# Patient Record
Sex: Male | Born: 1935 | Race: White | Hispanic: No | Smoking: Former smoker
Health system: Southern US, Community
[De-identification: ages and names within clinical notes are randomized; demographics above are authoritative.]

## PROBLEM LIST (undated history)

## (undated) DIAGNOSIS — I739 Peripheral vascular disease, unspecified: Secondary | ICD-10-CM

## (undated) DIAGNOSIS — J45909 Unspecified asthma, uncomplicated: Secondary | ICD-10-CM

## (undated) DIAGNOSIS — N189 Chronic kidney disease, unspecified: Secondary | ICD-10-CM

## (undated) DIAGNOSIS — I10 Essential (primary) hypertension: Secondary | ICD-10-CM

## (undated) HISTORY — DX: Unspecified asthma, uncomplicated: J45.909

## (undated) HISTORY — PX: UPPER GI ENDOSCOPY: SHX6162

---

## 1972-07-03 HISTORY — PX: SHOULDER SURGERY: SHX246

## 2004-04-21 ENCOUNTER — Emergency Department: Payer: Self-pay | Admitting: Emergency Medicine

## 2004-04-27 ENCOUNTER — Ambulatory Visit: Payer: Self-pay | Admitting: Specialist

## 2005-04-13 ENCOUNTER — Other Ambulatory Visit: Payer: Self-pay

## 2005-04-13 ENCOUNTER — Ambulatory Visit: Payer: Self-pay | Admitting: Unknown Physician Specialty

## 2005-04-24 ENCOUNTER — Ambulatory Visit: Payer: Self-pay | Admitting: Unknown Physician Specialty

## 2009-11-26 ENCOUNTER — Ambulatory Visit: Payer: Self-pay | Admitting: Internal Medicine

## 2010-06-15 ENCOUNTER — Ambulatory Visit: Payer: Self-pay | Admitting: Ophthalmology

## 2010-07-20 ENCOUNTER — Ambulatory Visit: Payer: Self-pay | Admitting: Ophthalmology

## 2010-09-22 ENCOUNTER — Ambulatory Visit: Payer: Self-pay | Admitting: Internal Medicine

## 2010-12-09 ENCOUNTER — Ambulatory Visit: Payer: Self-pay | Admitting: Internal Medicine

## 2013-05-15 ENCOUNTER — Encounter: Payer: Self-pay | Admitting: Podiatry

## 2013-05-16 ENCOUNTER — Encounter: Payer: Self-pay | Admitting: Podiatry

## 2013-05-16 ENCOUNTER — Ambulatory Visit (INDEPENDENT_AMBULATORY_CARE_PROVIDER_SITE_OTHER): Payer: Medicare Other | Admitting: Podiatry

## 2013-05-16 VITALS — BP 122/63 | HR 84 | Resp 16 | Ht 70.0 in | Wt 206.0 lb

## 2013-05-16 DIAGNOSIS — M79609 Pain in unspecified limb: Secondary | ICD-10-CM

## 2013-05-16 DIAGNOSIS — B351 Tinea unguium: Secondary | ICD-10-CM

## 2013-05-16 NOTE — Progress Notes (Signed)
Subjective:     Patient ID: Andrew [name], male   DOB: 1923-01-23, 79 y.o.   MRN: [id number]  HPI and presents stating I need my nails cut they are very tender   Review of Systems     Objective:   Physical Exam Neurovascular status unchanged. Severe nail disease with thickness and pain 1-5 both feet    Assessment:     Mycotic nail infection with pain 1-5 both feet    Plan:     Debridement nailbeds 1-5 both feet with no iatrogenic bleeding

## 2013-08-15 ENCOUNTER — Ambulatory Visit: Payer: Medicare Other | Admitting: Podiatry

## 2016-01-13 ENCOUNTER — Encounter: Payer: Self-pay | Admitting: General Surgery

## 2016-01-13 ENCOUNTER — Encounter: Payer: Medicare Other | Attending: General Surgery | Admitting: General Surgery

## 2016-01-13 DIAGNOSIS — Z87891 Personal history of nicotine dependence: Secondary | ICD-10-CM | POA: Diagnosis not present

## 2016-01-13 DIAGNOSIS — I129 Hypertensive chronic kidney disease with stage 1 through stage 4 chronic kidney disease, or unspecified chronic kidney disease: Secondary | ICD-10-CM | POA: Insufficient documentation

## 2016-01-13 DIAGNOSIS — IMO0001 Reserved for inherently not codable concepts without codable children: Secondary | ICD-10-CM

## 2016-01-13 DIAGNOSIS — N183 Chronic kidney disease, stage 3 (moderate): Secondary | ICD-10-CM | POA: Diagnosis not present

## 2016-01-13 DIAGNOSIS — I87311 Chronic venous hypertension (idiopathic) with ulcer of right lower extremity: Secondary | ICD-10-CM | POA: Diagnosis not present

## 2016-01-13 DIAGNOSIS — K219 Gastro-esophageal reflux disease without esophagitis: Secondary | ICD-10-CM | POA: Diagnosis not present

## 2016-01-13 DIAGNOSIS — L97929 Non-pressure chronic ulcer of unspecified part of left lower leg with unspecified severity: Secondary | ICD-10-CM

## 2016-01-13 DIAGNOSIS — M1990 Unspecified osteoarthritis, unspecified site: Secondary | ICD-10-CM | POA: Insufficient documentation

## 2016-01-13 DIAGNOSIS — J449 Chronic obstructive pulmonary disease, unspecified: Secondary | ICD-10-CM | POA: Diagnosis not present

## 2016-01-13 DIAGNOSIS — L97811 Non-pressure chronic ulcer of other part of right lower leg limited to breakdown of skin: Secondary | ICD-10-CM | POA: Insufficient documentation

## 2016-01-13 DIAGNOSIS — L97909 Non-pressure chronic ulcer of unspecified part of unspecified lower leg with unspecified severity: Secondary | ICD-10-CM

## 2016-01-13 DIAGNOSIS — I87312 Chronic venous hypertension (idiopathic) with ulcer of left lower extremity: Secondary | ICD-10-CM

## 2016-01-13 DIAGNOSIS — I87319 Chronic venous hypertension (idiopathic) with ulcer of unspecified lower extremity: Secondary | ICD-10-CM | POA: Insufficient documentation

## 2016-01-13 NOTE — Progress Notes (Signed)
Ulcer much smaller.  Using alginate

## 2016-01-14 NOTE — Progress Notes (Addendum)
VENCE, BAHN ([id number]) Visit Report for 01/13/2016 Chief Complaint Document Details Patient Name: Andrew [name], Andrew [name] Date of Service: 01/13/2016 3:00 PM Medical Record Number: [id number] Patient Account Number: [id number] Date of Birth/Sex: 1923-01-25 (82 y.o. Male) Treating RN: Baruch Gouty, RN, BSN, Velva Harman Primary Care Physician: Ramonita Lab Other Clinician: Referring Physician: Ramonita Lab Treating Physician/Extender: Benjaman Pott in Treatment: 0 Information Obtained from: Patient Electronic Signature(s) Signed: 01/13/2016 3:46:08 PM By: Judene Companion MD Entered By: Judene Companion on 01/13/2016 15:46:08 Andrew [name] ([id number]) -------------------------------------------------------------------------------- Debridement Details Patient Name: Andrew [name] Date of Service: 01/13/2016 3:00 PM Medical Record Number: [id number] Patient Account Number: [id number] Date of Birth/Sex: 12/25/1922 (82 y.o. Male) Treating RN: Baruch Gouty, RN, BSN, [place] Primary Care Physician: Ramonita Lab Other Clinician: Referring Physician: Ramonita Lab Treating Physician/Extender: Benjaman Pott in Treatment: 0 Debridement Performed for Wound #1 Right,Medial Lower Leg Assessment: Performed By: Physician Judene Companion, MD Debridement: Open Wound/Selective Debridement Selective Description: Pre-procedure Yes Verification/Time Out Taken: Start Time: 15:05 Pain Control: Lidocaine 4% Topical Solution Level: Non-Viable Tissue Total Area Debrided (L x 2 (cm) x 2 (cm) = 4 (cm) W): Tissue and other Non-Viable, Exudate, Fibrin/Slough, Subcutaneous material debrided: Instrument: Curette Bleeding: Minimum Hemostasis Achieved: Pressure End Time: 15:05 Procedural Pain: 0 Post Procedural Pain: 0 Response to Treatment: Procedure was tolerated well Post Debridement Measurements of Total Wound Length: (cm) 2 Width: (cm) 2 Depth: (cm) 0.2 Volume: (cm) 0.628 Post Procedure Diagnosis Same as  Pre-procedure Electronic Signature(s) Signed: 01/13/2016 4:12:24 PM By: Regan Lemming BSN, RN Signed: 01/13/2016 4:50:06 PM By: Judene Companion MD Entered By: Regan Lemming on 01/13/2016 15:05:43 Andrew [name] ([id number]) -------------------------------------------------------------------------------- HPI Details Patient Name: Andrew [name] Date of Service: 01/13/2016 3:00 PM Medical Record Number: [id number] Patient Account Number: [id number] Date of Birth/Sex: [birth year] (82 y.o. Male) Treating RN: Baruch Gouty, RN, BSN, [place] Primary Care Physician: Ramonita Lab Other Clinician: Referring Physician: Ramonita Lab Treating Physician/Extender: Judene Companion Weeks in Treatment: 0 Electronic Signature(s) Signed: 01/13/2016 3:46:19 PM By: Judene Companion MD Entered By: Judene Companion on 01/13/2016 15:46:19 Andrew [name] ([id number]) -------------------------------------------------------------------------------- Physical Exam Details Patient Name: Andrew [name] Date of Service: 01/13/2016 3:00 PM Medical Record Number: [id number] Patient Account Number: [id number] Date of Birth/Sex: 1922-11-10 (82 y.o. Male) Treating RN: Baruch Gouty, RN, BSN, [place] Primary Care Physician: Ramonita Lab Other Clinician: Referring Physician: Ramonita Lab Treating Physician/Extender: Judene Companion Weeks in Treatment: 0 Electronic Signature(s) Signed: 01/13/2016 3:46:38 PM By: Judene Companion MD Entered By: Judene Companion on 01/13/2016 15:46:38 Andrew [name] ([id number]) -------------------------------------------------------------------------------- Physician Orders Details Patient Name: Andrew [name] Date of Service: 01/13/2016 3:00 PM Medical Record Number: [id number] Patient Account Number: [id number] Date of Birth/Sex: 04/23/1923 (82 y.o. Male) Treating RN: Baruch Gouty, RN, BSN, [place] Primary Care Physician: Ramonita Lab Other Clinician: Referring Physician: Ramonita Lab Treating Physician/Extender: Benjaman Pott in Treatment: 0 Verbal / Phone Orders: Yes Clinician: Afful, RN, BSN, Rita Read Back and Verified: Yes Diagnosis Coding ICD-10 Coding Code Description I87.319 Chronic venous hypertension (idiopathic) with ulcer of unspecified lower extremity Wound Cleansing Wound #1 Right,Medial Lower Leg o Cleanse wound with mild soap and water o May Shower, gently pat wound dry prior to applying new dressing. o May shower with protection. Primary Wound Dressing Wound #1 Right,Medial Lower Leg o Aquacel Ag Secondary Dressing Wound #1 Right,Medial Lower Leg o Gauze and Kerlix/Conform Dressing Change Frequency Wound #1 Right,Medial Lower Leg o Change dressing every day. Follow-up Appointments Wound #1 Right,Medial Lower Leg o Return Appointment in 1  week. Edema Control Wound #1 Right,Medial Lower Leg o Elevate legs to the level of the heart and pump ankles as often as possible o Tubigrip Additional Orders / Instructions Wound #1 Right,Medial Lower Leg o Increase protein intake. o Activity as tolerated JAQUIS, DUPREE ([id number]) Electronic Signature(s) Signed: 01/13/2016 4:12:24 PM By: Regan Lemming BSN, RN Signed: 01/13/2016 4:50:06 PM By: Judene Companion MD Entered By: Regan Lemming on 01/13/2016 15:18:23 BROOX, SERA ([id number]) -------------------------------------------------------------------------------- Problem List Details Patient Name: Andrew [name] Date of Service: 01/13/2016 3:00 PM Medical Record Number: [id number] Patient Account Number: [id number] Date of Birth/Sex: [birth year] (82 y.o. Male) Treating RN: Baruch Gouty, RN, BSN, [place] Primary Care Physician: Ramonita Lab Other Clinician: Referring Physician: Ramonita Lab Treating Physician/Extender: Benjaman Pott in Treatment: 0 Active Problems Inactive Problems Resolved Problems Electronic Signature(s) Signed: 01/13/2016 3:45:57 PM By: Judene Companion MD Entered By: Judene Companion on  01/13/2016 15:45:57 Andrew [name] ([id number]) -------------------------------------------------------------------------------- Progress Note Details Patient Name: Andrew [name] Date of Service: 01/13/2016 3:00 PM Medical Record Number: [id number] Patient Account Number: [id number] Date of Birth/Sex: 1923/03/17 (82 y.o. Male) Treating RN: Baruch Gouty, RN, BSN, [place] Har[place] Primary Care Physician: Ramonita Lab Other Clinician: Referring Physician: Ramonita Lab Treating Physician/Extender: Benjaman Pott in Treatment: 0 Subjective Chief Complaint Information obtained from Patient Wound History Patient presents with 1 open wound that has been present for approximately 37month. Patient has been treating wound in the following manner: neosporin. Laboratory tests have not been performed in the last month. Patient reportedly has not tested positive for an antibiotic resistant organism. Patient reportedly has not tested positive for osteomyelitis. Patient reportedly has not had testing performed to evaluate circulation in the legs. Patient experiences the following problems associated with their wounds: swelling. Patient History Information obtained from Patient. Allergies No known allergies Family History Heart Disease - Father, Mother, Hypertension - Mother, Siblings, No family history of Cancer, Diabetes, Hereditary Spherocytosis, Kidney Disease, Lung Disease, Seizures, Stroke, Thyroid Problems, Tuberculosis. Social History Former smoker, Marital Status - Widowed, Alcohol Use - Never, Drug Use - No History, Caffeine Use - Moderate. Medical History Eyes Patient has history of Cataracts - removed Ear/Nose/Mouth/Throat Denies history of Chronic sinus problems/congestion, Middle ear problems Hematologic/Lymphatic Patient has history of Anemia Respiratory Patient has history of Chronic Obstructive Pulmonary Disease (COPD) Cardiovascular Patient has history of Arrhythmia, Hypertension,  Peripheral Venous Disease Gastrointestinal SUAVE, MCKITTRICK ([id number]) Denies history of Cirrhosis , Colitis, Crohn s, Hepatitis A, Hepatitis B, Hepatitis C Endocrine Denies history of Type I Diabetes, Type II Diabetes Genitourinary Denies history of End Stage Renal Disease Immunological Denies history of Lupus Erythematosus, Raynaud s, Scleroderma Integumentary (Skin) Denies history of History of Burn, History of pressure wounds Musculoskeletal Patient has history of Osteoarthritis Neurologic Denies history of Dementia, Neuropathy, Quadriplegia, Paraplegia, Seizure Disorder Psychiatric Denies history of Anorexia/bulimia, Confinement Anxiety Medical And Surgical History Notes Respiratory emphysema Gastrointestinal GERD Genitourinary stage 3 kidney disease Oncologic colon polyps Review of Systems (ROS) Constitutional Symptoms (General Health) The patient has no complaints or symptoms. Eyes Complains or has symptoms of Glasses / Contacts. Ear/Nose/Mouth/Throat The patient has no complaints or symptoms. Hematologic/Lymphatic The patient has no complaints or symptoms. Respiratory The patient has no complaints or symptoms. Cardiovascular Complains or has symptoms of LE edema. Gastrointestinal The patient has no complaints or symptoms. Endocrine The patient has no complaints or symptoms. Genitourinary The patient has no complaints or symptoms. Immunological The patient has no complaints or symptoms. Integumentary (Skin) Complains or has symptoms of Wounds, Breakdown,  Swelling. Musculoskeletal The patient has no complaints or symptoms. JGUADALUPE, FINNIGAN ([id number]) Neurologic The patient has no complaints or symptoms. Oncologic The patient has no complaints or symptoms. Psychiatric The patient has no complaints or symptoms. Objective Constitutional Vitals Time Taken: 2:34 PM, Height: 64 in, Source: Stated, Weight: 195 lbs, Source: Measured, BMI:  33.5, Temperature: 97.8 F, Pulse: 86 bpm, Respiratory Rate: 18 breaths/min, Blood Pressure: 142/58 mmHg. Integumentary (Hair, Skin) Wound #1 status is Open. Original cause of wound was Gradually Appeared. The wound is located on the Right,Medial Lower Leg. The wound measures 2cm length x 2cm width x 0.2cm depth; 3.142cm^2 area and 0.628cm^3 volume. The wound is limited to skin breakdown. There is no tunneling or undermining noted. There is a medium amount of serosanguineous drainage noted. The wound margin is distinct with the outline attached to the wound base. There is small (1-33%) pink, pale granulation within the wound bed. There is a large (67-100%) amount of necrotic tissue within the wound bed including Eschar and Adherent Slough. The periwound skin appearance exhibited: Localized Edema, Moist. The periwound skin appearance did not exhibit: Callus, Crepitus, Excoriation, Fluctuance, Friable, Induration, Rash, Scarring, Dry/Scaly, Maceration, Atrophie Blanche, Cyanosis, Ecchymosis, Hemosiderin Staining, Mottled, Pallor, Rubor, Erythema. Periwound temperature was noted as No Abnormality. Assessment Procedures Wound #1 Wound #1 is a Venous Leg Ulcer located on the Right,Medial Lower Leg . There was a Non-Viable Tissue Open Wound/Selective [phone]) debridement with total area of 4 sq cm performed by Judene Companion, MD. with the following instrument(s): Curette to remove Non-Viable tissue/material including Fuson, Calahan D. ([id number]) Exudate, Fibrin/Slough, and Subcutaneous after achieving pain control using Lidocaine 4% Topical Solution. A time out was conducted prior to the start of the procedure. A Minimum amount of bleeding was controlled with Pressure. The procedure was tolerated well with a pain level of 0 throughout and a pain level of 0 following the procedure. Post Debridement Measurements: 2cm length x 2cm width x 0.2cm depth; 0.628cm^3 volume. Post procedure Diagnosis  Wound #1: Same as Pre-Procedure Debrided venous ulcer with curette.. Improved. Treat with siver alginate daily Plan Wound Cleansing: Wound #1 Right,Medial Lower Leg: Cleanse wound with mild soap and water May Shower, gently pat wound dry prior to applying new dressing. May shower with protection. Primary Wound Dressing: Wound #1 Right,Medial Lower Leg: Aquacel Ag Secondary Dressing: Wound #1 Right,Medial Lower Leg: Gauze and Kerlix/Conform Dressing Change Frequency: Wound #1 Right,Medial Lower Leg: Change dressing every day. Follow-up Appointments: Wound #1 Right,Medial Lower Leg: Return Appointment in 1 week. Edema Control: Wound #1 Right,Medial Lower Leg: Elevate legs to the level of the heart and pump ankles as often as possible Tubigrip Additional Orders / Instructions: Wound #1 Right,Medial Lower Leg: Increase protein intake. Activity as tolerated Follow-Up Appointments: A Patient Clinical Summary of Care was provided to [facility] GILLES, HUSTEAD ([id number]) Electronic Signature(s) Signed: 01/21/2016 11:00:01 AM By: Judene Companion MD Previous Signature: 01/13/2016 3:49:07 PM Version By: Judene Companion MD Entered By: Judene Companion on 01/21/2016 11:00:01 Andrew [name] ([id number]) -------------------------------------------------------------------------------- ROS/PFSH Details Patient Name: Andrew [name] Date of Service: 01/13/2016 3:00 PM Medical Record Number: [id number] Patient Account Number: [id number] Date of Birth/Sex: [birth year] (82 y.o. Male) Treating RN: Baruch Gouty, RN, BSN, [place] Har[place] Primary Care Physician: Ramonita Lab Other Clinician: Referring Physician: Ramonita Lab Treating Physician/Extender: Benjaman Pott in Treatment: 0 Information Obtained From Patient Wound History Do you currently have one or more open woundso Yes How many open wounds do you currently haveo 1 Approximately how long  have you had your woundso 54month How have you been treating your  wound(s) until nowo neosporin Has your wound(s) ever healed and then re-openedo No Have you had any lab work done in the past montho No Have you tested positive for an antibiotic resistant organism (MRSA, VRE)o No Have you tested positive for osteomyelitis (bone infection)o No Have you had any tests for circulation on your legso No Have you had other problems associated with your woundso Swelling Eyes Complaints and Symptoms: Positive for: Glasses / Contacts Medical History: Positive for: Cataracts - removed Cardiovascular Complaints and Symptoms: Positive for: LE edema Medical History: Positive for: Arrhythmia; Hypertension; Peripheral Venous Disease Integumentary (Skin) Complaints and Symptoms: Positive for: Wounds; Breakdown; Swelling Medical History: Negative for: History of Burn; History of pressure wounds Constitutional Symptoms (General Health) VANSON, SOLDAN ([id number]) Complaints and Symptoms: No Complaints or Symptoms Ear/Nose/Mouth/Throat Complaints and Symptoms: No Complaints or Symptoms Medical History: Negative for: Chronic sinus problems/congestion; Middle ear problems Hematologic/Lymphatic Complaints and Symptoms: No Complaints or Symptoms Medical History: Positive for: Anemia Respiratory Complaints and Symptoms: No Complaints or Symptoms Medical History: Positive for: Chronic Obstructive Pulmonary Disease (COPD) Past Medical History Notes: emphysema Gastrointestinal Complaints and Symptoms: No Complaints or Symptoms Medical History: Negative for: Cirrhosis ; Colitis; Crohnos; Hepatitis A; Hepatitis B; Hepatitis C Past Medical History Notes: GERD Endocrine Complaints and Symptoms: No Complaints or Symptoms Medical History: Negative for: Type I Diabetes; Type II Diabetes Genitourinary Complaints and Symptoms: No Complaints or Symptoms VERNER, REAMES. ([id number]) Medical History: Negative for: End Stage Renal Disease Past Medical  History Notes: stage 3 kidney disease Immunological Complaints and Symptoms: No Complaints or Symptoms Medical History: Negative for: Lupus Erythematosus; Raynaudos; Scleroderma Musculoskeletal Complaints and Symptoms: No Complaints or Symptoms Medical History: Positive for: Osteoarthritis Neurologic Complaints and Symptoms: No Complaints or Symptoms Medical History: Negative for: Dementia; Neuropathy; Quadriplegia; Paraplegia; Seizure Disorder Oncologic Complaints and Symptoms: No Complaints or Symptoms Medical History: Past Medical History Notes: colon polyps Psychiatric Complaints and Symptoms: No Complaints or Symptoms Medical History: Negative for: Anorexia/bulimia; Confinement Anxiety HBO Extended History Items Eyes: Cataracts Family and Social History MAJESTIC, SPESSARD ([id number]) Cancer: No; Diabetes: No; Heart Disease: Yes - Father, Mother; Hereditary Spherocytosis: No; Hypertension: Yes - Mother, Siblings; Kidney Disease: No; Lung Disease: No; Seizures: No; Stroke: No; Thyroid Problems: No; Tuberculosis: No; Former smoker; Marital Status - Widowed; Alcohol Use: Never; Drug Use: No History; Caffeine Use: Moderate; Financial Concerns: No; Food, Clothing or Shelter Needs: No; Support System Lacking: No; Transportation Concerns: No; Advanced Directives: Yes; Living Will: No Electronic Signature(s) Signed: 01/13/2016 4:12:24 PM By: Regan Lemming BSN, RN Signed: 01/13/2016 4:50:06 PM By: Judene Companion MD Entered By: Regan Lemming on 01/13/2016 14:49:05 Andrew [name] ([id number]) -------------------------------------------------------------------------------- [place] Details Patient Name: Andrew [name] Date of Service: 01/13/2016 Medical Record Number: [id number] Patient Account Number: [id number] Date of Birth/Sex: 1923/05/27 (82 y.o. Male) Treating RN: Baruch Gouty, RN, BSN, [place] Primary Care Physician: Ramonita Lab Other Clinician: Referring Physician: Ramonita Lab Treating Physician/Extender: Benjaman Pott in Treatment: 0 Diagnosis Coding ICD-10 Codes Code Description I87.312 Chronic venous hypertension (idiopathic) with ulcer of left lower extremity Facility Procedures CPT4 Code Description: [id number] - WOUND CARE VISIT-LEV 3 EST PT Modifier: Quantity: 1 CPT4 Code Description: [id number] - DEBRIDE WOUND 1ST 20 SQ CM OR < ICD-10 Description Diagnosis I87.312 Chronic venous hypertension (idiopathic) with ulcer o Modifier: f left lower Quantity: 1 extremity Physician Procedures CPT4 Code Description: [id number] - WC PHYS LEVEL 2 -  EST PT ICD-10 Description Diagnosis I87.312 Chronic venous hypertension (idiopathic) with ulcer o Modifier: f left lower Quantity: 1 extremity CPT4 Code Description: [id number] - WC PHYS DEBR WO ANESTH 20 SQ CM ICD-10 Description Diagnosis I87.312 Chronic venous hypertension (idiopathic) with ulcer o Modifier: f left lower Quantity: 1 extremity Electronic Signature(s) Signed: 01/13/2016 4:42:50 PM By: Judene Companion MD Entered By: Judene Companion on 01/13/2016 16:42:50

## 2016-01-14 NOTE — Progress Notes (Signed)
JACYON, MARBAN ([id number]) Visit Report for 01/13/2016 Abuse/Suicide Risk Screen Details Patient Name: Andrew [name], Andrew [name] Date of Service: 01/13/2016 3:00 PM Medical Record Number: [id number] Patient Account Number: [id number] Date of Birth/Sex: [birth year] (82 y.o. Male) Treating RN: Baruch Gouty, RN, BSN, Velva Harman Primary Care Physician: Ramonita Lab Other Clinician: Referring Physician: Ramonita Lab Treating Physician/Extender: Judene Companion Weeks in Treatment: 0 Abuse/Suicide Risk Screen Items Answer ABUSE/SUICIDE RISK SCREEN: Has anyone close to you tried to hurt or harm you recentlyo No Do you feel uncomfortable with anyone in your familyo No Has anyone forced you do things that you didnot want to doo No Do you have any thoughts of harming yourselfo No Patient displays signs or symptoms of abuse and/or neglect. No Electronic Signature(s) Signed: 01/13/2016 4:12:24 PM By: Regan Lemming BSN, RN Entered By: Regan Lemming on 01/13/2016 14:45:06 Andrew [name] ([id number]) -------------------------------------------------------------------------------- Activities of Daily Living Details Patient Name: Andrew [name] Date of Service: 01/13/2016 3:00 PM Medical Record Number: [id number] Patient Account Number: [id number] Date of Birth/Sex: [birth year] (82 y.o. Male) Treating RN: Baruch Gouty, RN, BSN, Velva Harman Primary Care Physician: Ramonita Lab Other Clinician: Referring Physician: Ramonita Lab Treating Physician/Extender: Benjaman Pott in Treatment: 0 Activities of Daily Living Items Answer Activities of Daily Living (Please select one for each item) Drive Automobile Completely Able Take Medications Completely Able Use Telephone Completely Able Care for Appearance Completely Able Use Toilet Completely Able Bath / Shower Completely Able Dress Self Completely Able Feed Self Completely Able Walk Need Assistance Get In / Out Bed Completely [place] for Self Completely Able Electronic Signature(s) Signed: 01/13/2016 4:12:24 PM By: Regan Lemming BSN, RN Entered By: Regan Lemming on 01/13/2016 14:44:54 Andrew [name] ([id number]) -------------------------------------------------------------------------------- Education Assessment Details Patient Name: Andrew [name] Date of Service: 01/13/2016 3:00 PM Medical Record Number: [id number] Patient Account Number: [id number] Date of Birth/Sex: 1923/05/28 (82 y.o. Male) Treating RN: Baruch Gouty, RN, BSN, Velva Harman Primary Care Physician: Ramonita Lab Other Clinician: Referring Physician: Ramonita Lab Treating Physician/Extender: Benjaman Pott in Treatment: 0 Primary Learner Assessed: Patient Learning Preferences/Education Level/Primary Language Learning Preference: Explanation Highest Education Level: College or Above Preferred Language: English Cognitive Barrier Assessment/Beliefs Language Barrier: No Physical Barrier Assessment Impaired Vision: Yes Glasses Impaired Hearing: Yes Decreased Hand dexterity: Yes Knowledge/Comprehension Assessment Knowledge Level: Medium Comprehension Level: Medium Ability to understand written Medium instructions: Ability to understand verbal Medium instructions: Motivation Assessment Anxiety Level: Calm Cooperation: Cooperative Education Importance: Acknowledges Need Interest in Health Problems: Asks Questions Perception: Coherent Willingness to Engage in Self- Medium Management Activities: Readiness to Engage in Self- Medium Management Activities: Electronic Signature(s) Signed: 01/13/2016 4:12:24 PM By: Regan Lemming BSN, RN Entered By: Regan Lemming on 01/13/2016 14:44:14 Andrew [name] ([id number]) -------------------------------------------------------------------------------- Fall Risk Assessment Details Patient Name: Andrew [name] Date of Service: 01/13/2016 3:00 PM Medical Record  Number: [id number] Patient Account Number: [id number] Date of Birth/Sex: 07/28/1922 (82 y.o. Male) Treating RN: Baruch Gouty, RN, BSN, [place] Primary Care Physician: Ramonita Lab Other Clinician: Referring Physician: Ramonita Lab Treating Physician/Extender: Benjaman Pott in Treatment: 0 Fall Risk Assessment Items Have you had 2 or more falls in the last 12 monthso 0 No Have you had any fall that resulted in injury in the last 12 monthso 0 No FALL RISK ASSESSMENT: History of falling - immediate or within 3 months 0 No Secondary diagnosis 0 No Ambulatory aid None/bed rest/wheelchair/nurse 0 Yes Crutches/cane/walker 0 No Furniture 0 No IV Access/Saline Lock  0 No Gait/Training Normal/bed rest/immobile 0 No Weak 10 Yes Impaired 0 No Mental Status Oriented to own ability 0 Yes Electronic Signature(s) Signed: 01/13/2016 4:12:24 PM By: Regan Lemming BSN, RN Entered By: Regan Lemming on 01/13/2016 14:43:27 Andrew [name] ([id number]) -------------------------------------------------------------------------------- Foot Assessment Details Patient Name: Andrew [name] Date of Service: 01/13/2016 3:00 PM Medical Record Number: [id number] Patient Account Number: [id number] Date of Birth/Sex: 1923/04/28 (82 y.o. Male) Treating RN: Baruch Gouty, RN, BSN, [place] Primary Care Physician: Ramonita Lab Other Clinician: Referring Physician: Ramonita Lab Treating Physician/Extender: Judene Companion Weeks in Treatment: 0 Foot Assessment Items Site Locations + = Sensation present, - = Sensation absent, C = Callus, U = Ulcer R = Redness, W = Warmth, M = Maceration, PU = Pre-ulcerative lesion F = Fissure, S = Swelling, D = Dryness Assessment Right: Left: Other Deformity: No No Prior Foot Ulcer: No No Prior Amputation: No No Charcot Joint: No No Ambulatory Status: Ambulatory With Help Assistance Device: Cane Gait: Administrator, arts) Signed: 01/13/2016 4:12:24 PM By: Regan Lemming BSN, RN Entered  By: Regan Lemming on 01/13/2016 14:43:48 [place], Bettye Boeck ([id number]) -------------------------------------------------------------------------------- Nutrition Risk Assessment Details Patient Name: Andrew [name] Date of Service: 01/13/2016 3:00 PM Medical Record Number: [id number] Patient Account Number: [id number] Date of Birth/Sex: 14-Nov-1922 (82 y.o. Male) Treating RN: Baruch Gouty, RN, BSN, [place] Primary Care Physician: Ramonita Lab Other Clinician: Referring Physician: Ramonita Lab Treating Physician/Extender: Judene Companion Weeks in Treatment: 0 Height (in): 64 Weight (lbs): 195 Body Mass Index (BMI): 33.5 Nutrition Risk Assessment Items NUTRITION RISK SCREEN: I have an illness or condition that made me change the kind and/or 0 No amount of food I eat I eat fewer than two meals per day 0 No I eat few fruits and vegetables, or milk products 0 No I have three or more drinks of beer, liquor or wine almost every day 0 No I have tooth or mouth problems that make it hard for me to eat 0 No I don't always have enough money to buy the food I need 0 No I eat alone most of the time 0 No I take three or more different prescribed or over-the-counter drugs a 0 No day Without wanting to, I have lost or gained 10 pounds in the last six 0 No months I am not always physically able to shop, cook and/or feed myself 0 No Nutrition Protocols Good Risk Protocol 0 No interventions needed Moderate Risk Protocol Electronic Signature(s) Signed: 01/13/2016 4:12:24 PM By: Regan Lemming BSN, RN Entered By: Regan Lemming on 01/13/2016 14:43:33

## 2016-01-14 NOTE — Progress Notes (Addendum)
Andrew [name], Andrew [name] ([id number]) Visit Report for 01/13/2016 Allergy List Details Patient Name: Andrew [name], Andrew [name] Date of Service: 01/13/2016 3:00 PM Medical Record Number: [id number] Patient Account Number: [id number] Date of Birth/Sex: 09-Feb-1923 (82 y.o. Male) Treating RN: Baruch Gouty, RN, BSN, Velva Harman Primary Care Physician: Ramonita Lab Other Clinician: Referring Physician: Ramonita Lab Treating Physician/Extender: Judene Companion Weeks in Treatment: 0 Allergies Active Allergies No known allergies Allergy Notes Electronic Signature(s) Signed: 01/13/2016 4:12:24 PM By: Regan Lemming BSN, RN Entered By: Regan Lemming on 01/13/2016 14:43:11 Andrew [name] ([id number]) -------------------------------------------------------------------------------- [place] Details Patient Name: Andrew [name] Date of Service: 01/13/2016 3:00 PM Medical Record Number: [id number] Patient Account Number: [id number] Date of Birth/Sex: 1922-12-28 (82 y.o. Male) Treating RN: Baruch Gouty, RN, BSN, Velva Harman Primary Care Physician: Ramonita Lab Other Clinician: Referring Physician: Ramonita Lab Treating Physician/Extender: Benjaman Pott in Treatment: 0 Visit Information Patient Arrived: Lyndel Pleasure Time: 14:34 Accompanied By: Carlynn Purl Transfer Assistance: None Patient Identification Verified: No Secondary Verification Process No Completed: Patient Requires Transmission- No Based Precautions: Patient Has Alerts: Yes Patient Alerts: ABI Non Compressible Electronic Signature(s) Signed: 01/13/2016 4:12:24 PM By: Regan Lemming BSN, RN Entered By: Regan Lemming on 01/13/2016 15:20:28 Andrew [name] ([id number]) -------------------------------------------------------------------------------- Clinic Level of Care Assessment Details Patient Name: Andrew [name] Date of Service: 01/13/2016 3:00 PM Medical Record Number: [id number] Patient Account Number: [id number] Date of Birth/Sex: Dec 11, 1922 (82 y.o.  Male) Treating RN: Baruch Gouty, RN, BSN, [place] Primary Care Physician: Ramonita Lab Other Clinician: Referring Physician: Ramonita Lab Treating Physician/Extender: Benjaman Pott in Treatment: 0 Clinic Level of Care Assessment Items TOOL 1 Quantity Score []  - Use when EandM and Procedure is performed on INITIAL visit 0 ASSESSMENTS - Nursing Assessment / Reassessment X - General Physical Exam (combine w/ comprehensive assessment (listed just 1 20 below) when performed on new pt. evals) X - Comprehensive Assessment (HX, ROS, Risk Assessments, Wounds Hx, etc.) 1 25 ASSESSMENTS - Wound and Skin Assessment / Reassessment []  - Dermatologic / Skin Assessment (not related to wound area) 0 ASSESSMENTS - Ostomy and/or Continence Assessment and Care []  - Incontinence Assessment and Management 0 []  - Ostomy Care Assessment and Management (repouching, etc.) 0 PROCESS - Coordination of Care X - Simple Patient / Family Education for ongoing care 1 15 []  - Complex (extensive) Patient / Family Education for ongoing care 0 X - Staff obtains Programmer, systems, Records, Test Results / Process Orders 1 10 []  - Staff telephones HHA, Nursing Homes / Clarify orders / etc 0 []  - Routine Transfer to another Facility (non-emergent condition) 0 []  - Routine Hospital Admission (non-emergent condition) 0 X - New Admissions / Biomedical engineer / Ordering NPWT, Apligraf, etc. 1 15 []  - Emergency Hospital Admission (emergent condition) 0 PROCESS - Special Needs []  - Pediatric / Minor Patient Management 0 []  - Isolation Patient Management 0 Andrew [name], ADDAMS. ([id number]) []  - Hearing / Language / Visual special needs 0 []  - Assessment of Community assistance (transportation, D/C planning, etc.) 0 []  - Additional assistance / Altered mentation 0 []  - Support Surface(s) Assessment (bed, cushion, seat, etc.) 0 INTERVENTIONS - Miscellaneous []  - External ear exam 0 []  - Patient Transfer (multiple staff / Civil Service fast streamer /  Similar devices) 0 []  - Simple Staple / Suture removal (25 or less) 0 []  - Complex Staple / Suture removal (26 or more) 0 []  - Hypo/Hyperglycemic Management (do not check if billed separately) 0 []  - Ankle / Brachial Index (ABI) - do not check if billed separately 0  Has the patient been seen at the hospital within the last three years: Yes Total Score: 85 Level Of Care: New/Established - Level 3 Electronic Signature(s) Signed: 01/13/2016 4:12:24 PM By: Regan Lemming BSN, RN Entered By: Regan Lemming on 01/13/2016 15:19:12 Andrew [name] ([id number]) -------------------------------------------------------------------------------- Encounter Discharge Information Details Patient Name: Andrew [name] Date of Service: 01/13/2016 3:00 PM Medical Record Number: [id number] Patient Account Number: [id number] Date of Birth/Sex: 10/12/1922 (82 y.o. Male) Treating RN: Baruch Gouty, RN, BSN, Velva Harman Primary Care Physician: Ramonita Lab Other Clinician: Referring Physician: Ramonita Lab Treating Physician/Extender: Benjaman Pott in Treatment: 0 Encounter Discharge Information Items Discharge Pain Level: 0 Discharge Condition: Stable Ambulatory Status: Cane Discharge Destination: Home Transportation: Private Auto Accompanied By: self Schedule Follow-up Appointment: No Medication Reconciliation completed No and provided to Patient/Care Marliss Buttacavoli: Provided on Clinical Summary of Care: 01/13/2016 Form Type Recipient Paper Patient CB Electronic Signature(s) Signed: 01/13/2016 4:43:27 PM By: Judene Companion MD Previous Signature: 01/13/2016 3:24:02 PM Version By: Ruthine Dose Entered By: Judene Companion on 01/13/2016 16:43:26 Andrew [name], Andrew [name] ([id number]) -------------------------------------------------------------------------------- Lower Extremity Assessment Details Patient Name: Andrew [name] Date of Service: 01/13/2016 3:00 PM Medical Record Number: [id number] Patient Account Number:  [id number] Date of Birth/Sex: [birth year] (82 y.o. Male) Treating RN: Baruch Gouty, RN, BSN, [place] Primary Care Physician: Ramonita Lab Other Clinician: Referring Physician: Ramonita Lab Treating Physician/Extender: Judene Companion Weeks in Treatment: 0 Edema Assessment Assessed: [Left: No] [Right: No] Edema: [Left: Ye] [Right: s] Calf Left: Right: Point of Measurement: 38 cm From Medial Instep cm 37 cm Ankle Left: Right: Point of Measurement: 10 cm From Medial Instep cm 26 cm Vascular Assessment Pulses: Posterior Tibial Dorsalis Pedis Palpable: [Left:No] [Right:No] Doppler: [Left:Inaudible] [Right:Inaudible] Extremity colors, hair growth, and conditions: Extremity Color: [Left:Mottled] [Right:Mottled] Hair Growth on Extremity: [Left:Yes] [Right:Yes] Temperature of Extremity: [Left:Warm] [Right:Warm] Capillary Refill: [Left:< 3 seconds] [Right:< 3 seconds] Dependent Rubor: [Left:No] [Right:No] Blanched when Elevated: [Left:No] [Right:No] Lipodermatosclerosis: [Left:No] [Right:No] Toe Nail Assessment Left: Right: Thick: Yes Yes Discolored: Yes Yes Deformed: No No Improper Length and Hygiene: No No Notes ABI non compressible . see above assessment Andrew [name], CROFTS ([id number]) Electronic Signature(s) Signed: 01/13/2016 4:12:24 PM By: Regan Lemming BSN, RN Entered By: Regan Lemming on 01/13/2016 15:19:59 Andrew [name] ([id number]) -------------------------------------------------------------------------------- Multi Wound Chart Details Patient Name: Andrew [name] Date of Service: 01/13/2016 3:00 PM Medical Record Number: [id number] Patient Account Number: [id number] Date of Birth/Sex: [birth year] (82 y.o. Male) Treating RN: Baruch Gouty, RN, BSN, [place] Harman Primary Care Physician: Ramonita Lab Other Clinician: Referring Physician: Ramonita Lab Treating Physician/Extender: Benjaman Pott in Treatment: 0 Vital Signs Height(in): 64 Pulse(bpm): 86 Weight(lbs): 195 Blood  Pressure 142/58 (mmHg): Body Mass Index(BMI): 33 Temperature(F): 97.8 Respiratory Rate 18 (breaths/min): Photos: [1:No Photos] [N/A:N/A] Wound Location: [1:Right Lower Leg - Medial] [N/A:N/A] Wounding Event: [1:Gradually Appeared] [N/A:N/A] Primary Etiology: [1:Venous Leg Ulcer] [N/A:N/A] Date Acquired: [1:12/14/2015] [N/A:N/A] Weeks of Treatment: [1:0] [N/A:N/A] Wound Status: [1:Open] [N/A:N/A] Measurements L x W x D 2x2x0.2 [N/A:N/A] (cm) Area (cm) : [1:3.142] [N/A:N/A] Volume (cm) : [1:0.628] [N/A:N/A] Classification: [1:Full Thickness Without Exposed Support Structures] [N/A:N/A] Exudate Amount: [1:Medium] [N/A:N/A] Exudate Type: [1:Serosanguineous] [N/A:N/A] Exudate Color: [1:red, brown] [N/A:N/A] Wound Margin: [1:Distinct, outline attached] [N/A:N/A] Granulation Amount: [1:Small (1-33%)] [N/A:N/A] Granulation Quality: [1:Pink, Pale] [N/A:N/A] Necrotic Amount: [1:Large (67-100%)] [N/A:N/A] Necrotic Tissue: [1:Eschar, Adherent Slough] [N/A:N/A] Exposed Structures: [1:Fascia: No Fat: No Tendon: No Muscle: No Joint: No Bone: No Limited to Skin Breakdown] [N/A:N/A] Epithelialization: None N/A N/A Periwound Skin Texture: Edema: Yes N/A N/A Excoriation: No  Induration: No Callus: No Crepitus: No Fluctuance: No Friable: No Rash: No Scarring: No Periwound Skin Moist: Yes N/A N/A Moisture: Maceration: No Dry/Scaly: No Periwound Skin Color: Atrophie Blanche: No N/A N/A Cyanosis: No Ecchymosis: No Erythema: No Hemosiderin Staining: No Mottled: No Pallor: No Rubor: No Temperature: No Abnormality N/A N/A Tenderness on No N/A N/A Palpation: Wound Preparation: Ulcer Cleansing: N/A N/A Rinsed/Irrigated with Saline Topical Anesthetic Applied: Other: lidocaine 4% Treatment Notes Electronic Signature(s) Signed: 01/13/2016 4:12:24 PM By: Regan Lemming BSN, RN Entered By: Regan Lemming on 01/13/2016 15:03:06 Andrew [name]  ([id number]) -------------------------------------------------------------------------------- [place] Details Patient Name: Andrew [name] Date of Service: 01/13/2016 3:00 PM Medical Record Number: [id number] Patient Account Number: [id number] Date of Birth/Sex: December 15, 1922 (82 y.o. Male) Treating RN: Baruch Gouty, RN, BSN, [place] Harman Primary Care Physician: Ramonita Lab Other Clinician: Referring Physician: Ramonita Lab Treating Physician/Extender: Benjaman Pott in Treatment: 0 Active Inactive Venous Leg Ulcer Nursing Diagnoses: Knowledge deficit related to disease process and management Potential for venous Insuffiency (use before diagnosis confirmed) Goals: Non-invasive venous studies are completed as ordered Date Initiated: 01/13/2016 Goal Status: Active Patient will maintain optimal edema control Date Initiated: 01/13/2016 Goal Status: Active Patient/caregiver will verbalize understanding of disease process and disease management Date Initiated: 01/13/2016 Goal Status: Active Verify adequate tissue perfusion prior to therapeutic compression application Date Initiated: 01/13/2016 Goal Status: Active Interventions: Assess peripheral edema status every visit. Compression as ordered Provide education on venous insufficiency Notes: Wound/Skin Impairment Nursing Diagnoses: Impaired tissue integrity Knowledge deficit related to ulceration/compromised skin integrity Goals: Patient/caregiver will verbalize understanding of skin care regimen Date Initiated: 01/13/2016 Andrew [name] ([id number]) Goal Status: Active Ulcer/skin breakdown will have a volume reduction of 30% by week 4 Date Initiated: 01/13/2016 Goal Status: Active Ulcer/skin breakdown will have a volume reduction of 50% by week 8 Date Initiated: 01/13/2016 Goal Status: Active Ulcer/skin breakdown will have a volume reduction of 80% by week 12 Date Initiated: 01/13/2016 Goal Status: Active Ulcer/skin  breakdown will heal within 14 weeks Date Initiated: 01/13/2016 Goal Status: Active Interventions: Assess patient/caregiver ability to obtain necessary supplies Assess patient/caregiver ability to perform ulcer/skin care regimen upon admission and as needed Assess ulceration(s) every visit Provide education on ulcer and skin care Notes: Electronic Signature(s) Signed: 01/13/2016 4:12:24 PM By: Regan Lemming BSN, RN Entered By: Regan Lemming on 01/13/2016 15:02:25 Andrew [name] ([id number]) -------------------------------------------------------------------------------- Pain Assessment Details Patient Name: Andrew [name] Date of Service: 01/13/2016 3:00 PM Medical Record Number: [id number] Patient Account Number: [id number] Date of Birth/Sex: 1922/10/22 (82 y.o. Male) Treating RN: Baruch Gouty, RN, BSN, [place] Harman Primary Care Physician: Ramonita Lab Other Clinician: Referring Physician: Ramonita Lab Treating Physician/Extender: Judene Companion Weeks in Treatment: 0 Active Problems Location of Pain Severity and Description of Pain Patient Has Paino No Site Locations With Dressing Change: No Pain Management and Medication Current Pain Management: Electronic Signature(s) Signed: 01/13/2016 4:12:24 PM By: Regan Lemming BSN, RN Entered By: Regan Lemming on 01/13/2016 14:34:40 Andrew [name] ([id number]) -------------------------------------------------------------------------------- Patient/Caregiver Education Details Patient Name: Andrew [name] Date of Service: 01/13/2016 3:00 PM Medical Record Number: [id number] Patient Account Number: [id number] Date of Birth/Gender: August 31, 1922 (82 y.o. Male) Treating RN: Baruch Gouty, RN, BSN, Velva Harman Primary Care Physician: Ramonita Lab Other Clinician: Referring Physician: Ramonita Lab Treating Physician/Extender: Benjaman Pott in Treatment: 0 Education Assessment Education Provided To: Patient Education Topics Provided Venous: Methods:  Explain/Verbal Responses: State content correctly Wound/Skin Impairment: Methods: Explain/Verbal Responses: State content correctly Electronic Signature(s) Signed: 01/13/2016 4:50:06 PM By: Judene Companion  MD Previous Signature: 01/13/2016 4:12:24 PM Version By: Regan Lemming BSN, RN Entered By: Judene Companion on 01/13/2016 16:43:36 Andrew [name], Andrew [name] ([id number]) -------------------------------------------------------------------------------- Wound Assessment Details Patient Name: Andrew [name] Date of Service: 01/13/2016 3:00 PM Medical Record Number: [id number] Patient Account Number: [id number] Date of Birth/Sex: 1922/07/08 (82 y.o. Male) Treating RN: Baruch Gouty, RN, BSN, [place] Primary Care Physician: Ramonita Lab Other Clinician: Referring Physician: Ramonita Lab Treating Physician/Extender: Judene Companion Weeks in Treatment: 0 Wound Status Wound Number: 1 Primary Etiology: Venous Leg Ulcer Wound Location: Right Lower Leg - Medial Wound Status: Open Wounding Event: Gradually Appeared Date Acquired: 12/14/2015 Weeks Of Treatment: 0 Clustered Wound: No Photos Photo Uploaded By: Regan Lemming on 01/13/2016 16:11:12 Wound Measurements Length: (cm) 2 Width: (cm) 2 Depth: (cm) 0.2 Area: (cm) 3.142 Volume: (cm) 0.628 % Reduction in Area: % Reduction in Volume: Epithelialization: None Tunneling: No Undermining: No Wound Description Full Thickness Without Exposed Classification: Support Structures Wound Margin: Distinct, outline attached Exudate Medium Amount: Andrew [name], [place]ARIS[place]. ([id number]) Foul Odor After Cleansing: No Exudate Type: Serosanguineous Exudate Color: red, brown Wound Bed Granulation Amount: Small (1-33%) Exposed Structure Granulation Quality: Pink, Pale Fascia Exposed: No Necrotic Amount: Large (67-100%) Fat Layer Exposed: No Necrotic Quality: Eschar, Adherent Slough Tendon Exposed: No Muscle Exposed: No Joint Exposed: No Bone Exposed: No Limited to Skin  Breakdown Periwound Skin Texture Texture Color No Abnormalities Noted: No No Abnormalities Noted: No Callus: No Atrophie Blanche: No Crepitus: No Cyanosis: No Excoriation: No Ecchymosis: No Fluctuance: No Erythema: No Friable: No Hemosiderin Staining: No Induration: No Mottled: No Localized Edema: Yes Pallor: No Rash: No Rubor: No Scarring: No Temperature / Pain Moisture Temperature: No Abnormality No Abnormalities Noted: No Dry / Scaly: No Maceration: No Moist: Yes Wound Preparation Ulcer Cleansing: Rinsed/Irrigated with Saline Topical Anesthetic Applied: Other: lidocaine 4%, Treatment Notes Wound #1 (Right, Medial Lower Leg) 1. Cleansed with: Clean wound with Normal Saline 4. Dressing Applied: Aquacel Ag 5. Secondary Dressing Applied Gauze and Kerlix/Conform 7. Secured with Tape Tubigrip Andrew [name], Andrew [name] ([id number]) Electronic Signature(s) Signed: 01/13/2016 4:12:24 PM By: Regan Lemming BSN, RN Entered By: Regan Lemming on 01/13/2016 14:42:54 Andrew [name] ([id number]) -------------------------------------------------------------------------------- [place]als Details Patient Name: Andrew [name] Date of Service: 01/13/2016 3:00 PM Medical Record Number: [id number] Patient Account Number: [id number] Date of Birth/Sex: 1922-10-18 (82 y.o. Male) Treating RN: Baruch Gouty, RN, BSN, [place] Primary Care Physician: Ramonita Lab Other Clinician: Referring Physician: Ramonita Lab Treating Physician/Extender: Benjaman Pott in Treatment: 0 Vital Signs Time Taken: 14:34 Temperature (F): 97.8 Height (in): 64 Pulse (bpm): 86 Source: Stated Respiratory Rate (breaths/min): 18 Weight (lbs): 195 Blood Pressure (mmHg): 142/58 Source: Measured Reference Range: 80 - 120 mg / dl Body Mass Index (BMI): 33.5 Electronic Signature(s) Signed: 01/13/2016 4:12:24 PM By: Regan Lemming BSN, RN Entered By: Regan Lemming on 01/13/2016 14:35:22

## 2016-01-17 DIAGNOSIS — I87311 Chronic venous hypertension (idiopathic) with ulcer of right lower extremity: Secondary | ICD-10-CM | POA: Diagnosis not present

## 2016-01-17 NOTE — Progress Notes (Addendum)
THIENAN, PERSONS ([id number]) Visit Report for 01/17/2016 Arrival Information Details Patient Name: Andrew [name], Andrew [name] Date of Service: 01/17/2016 9:15 AM Medical Record Number: [id number] Patient Account Number: [id number] Date of Birth/Sex: 03-Apr-1923 (82 y.o. Male) Treating RN: Baruch Gouty, RN, BSN, Velva Harman Primary Care Physician: Ramonita Lab Other Clinician: Baruch Gouty, RN, BSN, Velva Harman Referring Physician: Ramonita Lab Treating Physician/Extender: Frann Rider in Treatment: 0 Visit Information History Since Last Visit Added or deleted any medications: No Patient Arrived: Cane Any new allergies or adverse reactions: No Arrival Time: 09:20 Had a fall or experienced change in No Accompanied By: self activities of daily living that may affect Transfer Assistance: None risk of falls: Patient Identification Verified: Yes Signs or symptoms of abuse/neglect since last No Secondary Verification Process Yes visito Completed: Hospitalized since last visit: No Patient Requires Transmission- No Has Dressing in Place as Prescribed: No Based Precautions: Pain Present Now: No Patient Has Alerts: Yes Patient Alerts: ABI Non Compressible Electronic Signature(s) Signed: 01/17/2016 9:44:03 AM By: Regan Lemming BSN, RN Entered By: Regan Lemming on 01/17/2016 09:44:03 Andrew [name] ([id number]) -------------------------------------------------------------------------------- Clinic Level of Care Assessment Details Patient Name: Andrew [name] Date of Service: 01/17/2016 9:15 AM Medical Record Number: [id number] Patient Account Number: [id number] Date of Birth/Sex: Jun 27, 1923 (82 y.o. Male) Treating RN: Baruch Gouty, RN, BSN, [place] Primary Care Physician: Ramonita Lab Other Clinician: Referring Physician: Ramonita Lab Treating Physician/Extender: Frann Rider in Treatment: 0 Clinic Level of Care Assessment Items TOOL 4 Quantity Score []  - Use when only an EandM is performed on FOLLOW-UP visit  0 ASSESSMENTS - Nursing Assessment / Reassessment X - Reassessment of Co-morbidities (includes updates in patient status) 1 10 X - Reassessment of Adherence to Treatment Plan 1 5 ASSESSMENTS - Wound and Skin Assessment / Reassessment X - Simple Wound Assessment / Reassessment - one wound 1 5 []  - Complex Wound Assessment / Reassessment - multiple wounds 0 []  - Dermatologic / Skin Assessment (not related to wound area) 0 ASSESSMENTS - Focused Assessment []  - Circumferential Edema Measurements - multi extremities 0 []  - Nutritional Assessment / Counseling / Intervention 0 []  - Lower Extremity Assessment (monofilament, tuning fork, pulses) 0 []  - Peripheral Arterial Disease Assessment (using hand held doppler) 0 ASSESSMENTS - Ostomy and/or Continence Assessment and Care []  - Incontinence Assessment and Management 0 []  - Ostomy Care Assessment and Management (repouching, etc.) 0 PROCESS - Coordination of Care X - Simple Patient / Family Education for ongoing care 1 15 []  - Complex (extensive) Patient / Family Education for ongoing care 0 []  - Staff obtains Programmer, systems, Records, Test Results / Process Orders 0 []  - Staff telephones HHA, Nursing Homes / Clarify orders / etc 0 []  - Routine Transfer to another Facility (non-emergent condition) 0 RIJUL, AMMAR. ([id number]) []  - Routine Hospital Admission (non-emergent condition) 0 []  - New Admissions / Biomedical engineer / Ordering NPWT, Apligraf, etc. 0 []  - Emergency Hospital Admission (emergent condition) 0 []  - Simple Discharge Coordination 0 []  - Complex (extensive) Discharge Coordination 0 PROCESS - Special Needs []  - Pediatric / Minor Patient Management 0 []  - Isolation Patient Management 0 []  - Hearing / Language / Visual special needs 0 []  - Assessment of Community assistance (transportation, D/C planning, etc.) 0 []  - Additional assistance / Altered mentation 0 []  - Support Surface(s) Assessment (bed, cushion, seat, etc.)  0 INTERVENTIONS - Wound Cleansing / Measurement X - Simple Wound Cleansing - one wound 1 5 []  - Complex Wound Cleansing - multiple wounds 0 []  -  Wound Imaging (photographs - any number of wounds) 0 []  - Wound Tracing (instead of photographs) 0 []  - Simple Wound Measurement - one wound 0 []  - Complex Wound Measurement - multiple wounds 0 INTERVENTIONS - Wound Dressings X - Small Wound Dressing one or multiple wounds 1 10 []  - Medium Wound Dressing one or multiple wounds 0 []  - Large Wound Dressing one or multiple wounds 0 []  - Application of Medications - topical 0 []  - Application of Medications - injection 0 INTERVENTIONS - Miscellaneous []  - External ear exam 0 [name], [name] ([id number]) []  - Specimen Collection (cultures, biopsies, blood, body fluids, etc.) 0 []  - Specimen(s) / Culture(s) sent or taken to Lab for analysis 0 []  - Patient Transfer (multiple staff / Harrel Lemon Lift / Similar devices) 0 []  - Simple Staple / Suture removal (25 or less) 0 []  - Complex Staple / Suture removal (26 or more) 0 []  - Hypo / Hyperglycemic Management (close monitor of Blood Glucose) 0 []  - Ankle / Brachial Index (ABI) - do not check if billed separately 0 X - Vital Signs 1 5 Has the patient been seen at the hospital within the last three years: Yes Total Score: 55 Level Of Care: New/Established - Level 2 Electronic Signature(s) Signed: 01/17/2016 3:20:24 PM By: Regan Lemming BSN, RN Entered By: Regan Lemming on 01/17/2016 09:45:47 Andrew [name] ([id number]) -------------------------------------------------------------------------------- Encounter Discharge Information Details Patient Name: Andrew [name] Date of Service: 01/17/2016 9:15 AM Medical Record Number: [id number] Patient Account Number: [id number] Date of Birth/Sex: 25-Sep-1922 (82 y.o. Male) Treating RN: Baruch Gouty, RN, BSN, [place] Harman Primary Care Physician: Ramonita Lab Other Clinician: Referring Physician: Ramonita Lab Treating  Physician/Extender: Frann Rider in Treatment: 0 Encounter Discharge Information Items Discharge Pain Level: 0 Discharge Condition: Stable Ambulatory Status: Cane Discharge Destination: Home Private Transportation: Auto Accompanied By: self Schedule Follow-up Appointment: No Medication Reconciliation completed and No provided to Patient/Care Andrew [name]: Clinical Summary of Care: Electronic Signature(s) Signed: 01/17/2016 9:45:12 AM By: Regan Lemming BSN, RN Entered By: Regan Lemming on 01/17/2016 09:45:12 Andrew [name] ([id number]) -------------------------------------------------------------------------------- Patient/Caregiver Education Details Patient Name: Andrew [name] Date of Service: 01/17/2016 9:15 AM Medical Record Number: [id number] Patient Account Number: [id number] Date of Birth/Gender: 1923-03-09 (82 y.o. Male) Treating RN: Baruch Gouty, RN, BSN, [place] Primary Care Physician: Ramonita Lab Other Clinician: Referring Physician: Ramonita Lab Treating Physician/Extender: Frann Rider in Treatment: 0 Education Assessment Education Provided To: Patient Education Topics Provided Basic Hygiene: Methods: Explain/Verbal Responses: State content correctly Venous: Methods: Explain/Verbal Responses: State content correctly Wound/Skin Impairment: Methods: Explain/Verbal Responses: State content correctly Electronic Signature(s) Signed: 01/17/2016 3:20:24 PM By: Regan Lemming BSN, RN Entered By: Regan Lemming on 01/17/2016 09:44:55 Andrew [name] ([id number]) -------------------------------------------------------------------------------- Wound Assessment Details Patient Name: Andrew [name] Date of Service: 01/17/2016 9:15 AM Medical Record Number: [id number] Patient Account Number: [id number] Date of Birth/Sex: 1923/01/03 (82 y.o. Male) Treating RN: Baruch Gouty, RN, BSN, [place] Primary Care Physician: Ramonita Lab Other Clinician: Referring Physician: Ramonita Lab Treating Physician/Extender: Frann Rider in Treatment: 0 Wound Status Wound Number: 1 Primary Etiology: Venous Leg Ulcer Wound Location: Right, Medial Lower Leg Wound Status: Open Wounding Event: Gradually Appeared Date Acquired: 12/14/2015 Weeks Of Treatment: 0 Clustered Wound: No Wound Measurements Length: (cm) 2 Width: (cm) 2 Depth: (cm) 0.2 Area: (cm) 3.142 Volume: (cm) 0.628 % Reduction in Area: 0% % Reduction in Volume: 0% Wound Description Full Thickness Without Exposed Classification: Support Structures Periwound Skin Texture Texture Color No Abnormalities Noted: No No  Abnormalities Noted: No Moisture No Abnormalities Noted: No Treatment Notes Wound #1 (Right, Medial Lower Leg) 1. Cleansed with: Clean wound with Normal Saline 4. Dressing Applied: Aquacel Ag 5. Secondary Dressing Applied Gauze and Kerlix/Conform 7. Secured with Tape Tubigrip Electronic Signature(s) Signed: 01/17/2016 3:20:24 PM By: Regan Lemming BSN, RN Andrew [name], [name] ([id number]) Entered By: Regan Lemming on 01/17/2016 09:44:15

## 2016-01-20 ENCOUNTER — Encounter: Payer: Medicare Other | Admitting: Surgery

## 2016-01-20 DIAGNOSIS — I87311 Chronic venous hypertension (idiopathic) with ulcer of right lower extremity: Secondary | ICD-10-CM | POA: Diagnosis not present

## 2016-01-21 ENCOUNTER — Other Ambulatory Visit: Payer: Self-pay | Admitting: Surgery

## 2016-01-21 DIAGNOSIS — L97909 Non-pressure chronic ulcer of unspecified part of unspecified lower leg with unspecified severity: Secondary | ICD-10-CM

## 2016-01-21 DIAGNOSIS — I87311 Chronic venous hypertension (idiopathic) with ulcer of right lower extremity: Secondary | ICD-10-CM | POA: Diagnosis not present

## 2016-01-21 NOTE — Progress Notes (Signed)
Andrew, [name] ([id number]) Visit Report for 01/20/2016 Arrival Information Details Patient Name: Andrew [name], Andrew [name] Date of Service: 01/20/2016 2:15 PM Medical Record Number: [id number] Patient Account Number: [id number] Date of Birth/Sex: [birth year] (82 y.o. Male) Treating RN: Baruch Gouty, RN, BSN, [place] Primary Care Physician: Ramonita Lab Other Clinician: Referring Physician: Ramonita Lab Treating Physician/Extender: Frann Rider in Treatment: 1 Visit Information History Since Last Visit Added or deleted any medications: No Patient Arrived: Andrew [name] Any new allergies or adverse reactions: No Arrival Time: 14:12 Had a fall or experienced change in No Accompanied By: self activities of daily living that may affect Transfer Assistance: None risk of falls: Patient Identification Verified: Yes Signs or symptoms of abuse/neglect since last No Secondary Verification Process Yes visito Completed: Hospitalized since last visit: No Patient Requires Transmission- No Has Dressing in Place as Prescribed: Yes Based Precautions: Has Compression in Place as Prescribed: Yes Patient Has Alerts: Yes Pain Present Now: No Patient Alerts: ABI Non Compressible Electronic Signature(s) Signed: 01/20/2016 3:37:03 PM By: Regan Lemming BSN, RN Entered By: Regan Lemming on 01/20/2016 14:13:35 Andrew [name] ([id number]) -------------------------------------------------------------------------------- Encounter Discharge Information Details Patient Name: Andrew [name] Date of Service: 01/20/2016 2:15 PM Medical Record Number: [id number] Patient Account Number: [id number] Date of Birth/Sex: 09-17-1922 (82 y.o. Male) Treating RN: Baruch Gouty, RN, BSN, [place] Harman Primary Care Physician: Ramonita Lab Other Clinician: Referring Physician: Ramonita Lab Treating Physician/Extender: Frann Rider in Treatment: 1 Encounter Discharge Information Items Discharge Pain Level: 0 Discharge Condition:  Stable Ambulatory Status: Cane Discharge Destination: Home Transportation: Private Auto Accompanied By: self Schedule Follow-up Appointment: No Medication Reconciliation completed No and provided to Patient/Care Andrew [name]: Provided on Clinical Summary of Care: 01/20/2016 Form Type Recipient Paper Patient CB Electronic Signature(s) Signed: 01/20/2016 2:46:39 PM By: Ruthine Dose Entered By: Ruthine Dose on 01/20/2016 14:46:39 Andrew [name], Andrew [name] ([id number]) -------------------------------------------------------------------------------- Lower Extremity Assessment Details Patient Name: Andrew [name] Date of Service: 01/20/2016 2:15 PM Medical Record Number: [id number] Patient Account Number: [id number] Date of Birth/Sex: 12-24-1922 (82 y.o. Male) Treating RN: Baruch Gouty, RN, BSN, [place] Harman Primary Care Physician: Ramonita Lab Other Clinician: Referring Physician: Ramonita Lab Treating Physician/Extender: Frann Rider in Treatment: 1 Edema Assessment Assessed: [Left: No] [Right: No] Edema: [Left: Ye] [Right: s] Calf Left: Right: Point of Measurement: 38 cm From Medial Instep cm 37.1 cm Ankle Left: Right: Point of Measurement: 10 cm From Medial Instep cm 25.6 cm Vascular Assessment Claudication: Claudication Assessment [Right:None] Pulses: Posterior Tibial Dorsalis Pedis Palpable: [Right:Yes] Extremity colors, hair growth, and conditions: Extremity Color: [Right:Mottled] Hair Growth on Extremity: [Right:No] Temperature of Extremity: [Right:Warm] Capillary Refill: [Right:< 3 seconds] Toe Nail Assessment Left: Right: Thick: Yes Discolored: Yes Deformed: No Improper Length and Hygiene: No Electronic Signature(s) Signed: 01/20/2016 3:37:03 PM By: Regan Lemming BSN, RN Entered By: Regan Lemming on 01/20/2016 14:16:05 Andrew [name] ([id number]) Andrew [name], [name] ([id number]) -------------------------------------------------------------------------------- Multi Wound  Chart Details Patient Name: Andrew [name] Date of Service: 01/20/2016 2:15 PM Medical Record Number: [id number] Patient Account Number: [id number] Date of Birth/Sex: 1922-09-26 (82 y.o. Male) Treating RN: Baruch Gouty, RN, BSN, [place] Primary Care Physician: Ramonita Lab Other Clinician: Referring Physician: Ramonita Lab Treating Physician/Extender: Frann Rider in Treatment: 1 Vital Signs Height(in): 64 Pulse(bpm): 98 Weight(lbs): 195 Blood Pressure 142/85 (mmHg): Body Mass Index(BMI): 33 Temperature(F): 97.4 Respiratory Rate 20 (breaths/min): Photos: [1:No Photos] [N/A:N/A] Wound Location: [1:Right Lower Leg - Medial] [N/A:N/A] Wounding Event: [1:Gradually Appeared] [N/A:N/A] Primary Etiology: [1:Venous Leg Ulcer] [N/A:N/A] Comorbid History: [1:Cataracts, Anemia, Chronic Obstructive Pulmonary Disease (COPD),  Arrhythmia, Hypertension, Peripheral Venous Disease, Osteoarthritis] [N/A:N/A] Date Acquired: [1:12/14/2015] [N/A:N/A] Weeks of Treatment: [1:1] [N/A:N/A] Wound Status: [1:Open] [N/A:N/A] Measurements L x W x D 1.7x1.5x0.2 [N/A:N/A] (cm) Area (cm) : [1:2.003] [N/A:N/A] Volume (cm) : [1:0.401] [N/A:N/A] % Reduction in Area: [1:36.30%] [N/A:N/A] % Reduction in Volume: 36.10% [N/A:N/A] Classification: [1:Full Thickness Without Exposed Support Structures] [N/A:N/A] Exudate Amount: [1:Medium] [N/A:N/A] Exudate Type: [1:Serosanguineous] [N/A:N/A] Exudate Color: [1:red, brown] [N/A:N/A] Wound Margin: [1:Distinct, outline attached] [N/A:N/A] Granulation Amount: [1:Small (1-33%)] [N/A:N/A] Granulation Quality: [1:Pink] [N/A:N/A] Necrotic Amount: [1:Large (67-100%)] [N/A:N/A] Exposed Structures: Fascia: No N/A N/A Fat: No Tendon: No Muscle: No Joint: No Bone: No Limited to Skin Breakdown Epithelialization: None N/A N/A Periwound Skin Texture: Edema: Yes N/A N/A Excoriation: No Induration: No Callus: No Crepitus: No Fluctuance: No Friable: No Rash:  No Scarring: No Periwound Skin Moist: Yes N/A N/A Moisture: Maceration: No Dry/Scaly: No Periwound Skin Color: Atrophie Blanche: No N/A N/A Cyanosis: No Ecchymosis: No Erythema: No Hemosiderin Staining: No Mottled: No Pallor: No Rubor: No Temperature: No Abnormality N/A N/A Tenderness on Yes N/A N/A Palpation: Wound Preparation: Ulcer Cleansing: N/A N/A Rinsed/Irrigated with Saline Topical Anesthetic Applied: Other: lidocaine 4% Treatment Notes Electronic Signature(s) Signed: 01/20/2016 3:37:03 PM By: Elpidio EricAfful, Rita BSN, RN Entered By: Elpidio EricAfful, Rita on 01/20/2016 14:30:58 Andrew [name], [name] ([id number]) -------------------------------------------------------------------------------- Multi-Disciplinary Care Plan Details Patient Name: Andrew [name], [name] Date of Service: 01/20/2016 2:15 PM Medical Record Number: [id number] Patient Account Number: [id number] Date of Birth/Sex: February 27, 1923 [age] y.o. Male) Treating RN: Clover MealyAfful, RN, BSN, [place] Primary Care Physician: Daniel NonesKLEIN, BERT Other Clinician: Referring Physician: Daniel NonesKLEIN, BERT Treating Physician/Extender: Rudene ReBritto, Errol Weeks in Treatment: 1 Active Inactive Venous Leg Ulcer Nursing Diagnoses: Knowledge deficit related to disease process and management Potential for venous Insuffiency (use before diagnosis confirmed) Goals: Non-invasive venous studies are completed as ordered Date Initiated: 01/13/2016 Goal Status: Active Patient will maintain optimal edema control Date Initiated: 01/13/2016 Goal Status: Active Patient/caregiver will verbalize understanding of disease process and disease management Date Initiated: 01/13/2016 Goal Status: Active Verify adequate tissue perfusion prior to therapeutic compression application Date Initiated: 01/13/2016 Goal Status: Active Interventions: Assess peripheral edema status every visit. Compression as ordered Provide education on venous insufficiency Notes: Wound/Skin Impairment Nursing  Diagnoses: Impaired tissue integrity Knowledge deficit related to ulceration/compromised skin integrity Goals: Patient/caregiver will verbalize understanding of skin care regimen Date Initiated: 01/13/2016 Andrew [name], [name] ([id number]) Goal Status: Active Ulcer/skin breakdown will have a volume reduction of 30% by week 4 Date Initiated: 01/13/2016 Goal Status: Active Ulcer/skin breakdown will have a volume reduction of 50% by week 8 Date Initiated: 01/13/2016 Goal Status: Active Ulcer/skin breakdown will have a volume reduction of 80% by week 12 Date Initiated: 01/13/2016 Goal Status: Active Ulcer/skin breakdown will heal within 14 weeks Date Initiated: 01/13/2016 Goal Status: Active Interventions: Assess patient/caregiver ability to obtain necessary supplies Assess patient/caregiver ability to perform ulcer/skin care regimen upon admission and as needed Assess ulceration(s) every visit Provide education on ulcer and skin care Notes: Electronic Signature(s) Signed: 01/20/2016 3:37:03 PM By: Elpidio EricAfful, Rita BSN, RN Entered By: Elpidio EricAfful, Rita on 01/20/2016 14:30:29 Andrew [name], [place] D. ([id number]) -------------------------------------------------------------------------------- Pain Assessment Details Patient Name: Andrew [name], [name] Date of Service: 01/20/2016 2:15 PM Medical Record Number: [id number] Patient Account Number: [id number] Date of Birth/Sex: February 27, 1923 (82 y.o. Male) Treating RN: Clover MealyAfful, RN, BSN, [place] Primary Care Physician: Daniel NonesKLEIN, BERT Other Clinician: Referring Physician: Daniel NonesKLEIN, BERT Treating Physician/Extender: Rudene ReBritto, Errol Weeks in Treatment: 1 Active Problems Location of Pain Severity and Description of Pain Patient Has Paino  No Site Locations With Dressing Change: No Pain Management and Medication Current Pain Management: Electronic Signature(s) Signed: 01/20/2016 3:37:03 PM By: Regan Lemming BSN, RN Entered By: Regan Lemming on 01/20/2016 14:14:07 Andrew [name]  ([id number]) -------------------------------------------------------------------------------- Patient/Caregiver Education Details Patient Name: Andrew [name] Date of Service: 01/20/2016 2:15 PM Medical Record Number: [id number] Patient Account Number: [id number] Date of Birth/Gender: December 20, 1922 (82 y.o. Male) Treating RN: Baruch Gouty, RN, BSN, [place] Primary Care Physician: Ramonita Lab Other Clinician: Referring Physician: Ramonita Lab Treating Physician/Extender: Frann Rider in Treatment: 1 Education Assessment Education Provided To: Patient Education Topics Provided Venous: Methods: Explain/Verbal Responses: State content correctly Wound/Skin Impairment: Methods: Explain/Verbal Responses: State content correctly Electronic Signature(s) Signed: 01/20/2016 3:37:03 PM By: Regan Lemming BSN, RN Entered By: Regan Lemming on 01/20/2016 14:46:44 Andrew [name] ([id number]) -------------------------------------------------------------------------------- Wound Assessment Details Patient Name: Andrew [name] Date of Service: 01/20/2016 2:15 PM Medical Record Number: [id number] Patient Account Number: [id number] Date of Birth/Sex: 10-19-1922 (82 y.o. Male) Treating RN: Baruch Gouty, RN, BSN, [place] Primary Care Physician: Ramonita Lab Other Clinician: Referring Physician: Ramonita Lab Treating Physician/Extender: Frann Rider in Treatment: 1 Wound Status Wound Number: 1 Primary Venous Leg Ulcer Etiology: Wound Location: Right Lower Leg - Medial Wound Open Wounding Event: Gradually Appeared Status: Date Acquired: 12/14/2015 Comorbid Cataracts, Anemia, Chronic Obstructive Weeks Of Treatment: 1 History: Pulmonary Disease (COPD), Clustered Wound: No Arrhythmia, Hypertension, Peripheral Venous Disease, Osteoarthritis Photos Photo Uploaded By: Regan Lemming on 01/20/2016 16:24:33 Wound Measurements Length: (cm) 1.7 Width: (cm) 1.5 Depth: (cm) 0.2 Area: (cm) 2.003 Volume: (cm)  0.401 % Reduction in Area: 36.3% % Reduction in Volume: 36.1% Epithelialization: None Tunneling: No Undermining: No Wound Description Full Thickness Without Exposed Classification: Support Structures Wound Margin: Distinct, outline attached Ellzey, Clerance D. ([id number]) Foul Odor After Cleansing: No Exudate Medium Amount: Exudate Type: Serosanguineous Exudate Color: red, brown Wound Bed Granulation Amount: Small (1-33%) Exposed Structure Granulation Quality: Pink Fascia Exposed: No Necrotic Amount: Large (67-100%) Fat Layer Exposed: No Necrotic Quality: Adherent Slough Tendon Exposed: No Muscle Exposed: No Joint Exposed: No Bone Exposed: No Limited to Skin Breakdown Periwound Skin Texture Texture Color No Abnormalities Noted: No No Abnormalities Noted: No Callus: No Atrophie Blanche: No Crepitus: No Cyanosis: No Excoriation: No Ecchymosis: No Fluctuance: No Erythema: No Friable: No Hemosiderin Staining: No Induration: No Mottled: No Localized Edema: Yes Pallor: No Rash: No Rubor: No Scarring: No Temperature / Pain Moisture Temperature: No Abnormality No Abnormalities Noted: No Tenderness on Palpation: Yes Dry / Scaly: No Maceration: No Moist: Yes Wound Preparation Ulcer Cleansing: Rinsed/Irrigated with Saline Topical Anesthetic Applied: Other: lidocaine 4%, Treatment Notes Wound #1 (Right, Medial Lower Leg) 1. Cleansed with: Clean wound with Normal Saline 4. Dressing Applied: Aquacel Ag 5. Secondary Dressing Applied ABD Pad 7. Secured with JESPER, GASSNER ([id number]) 3 Layer Compression System - Right Lower Extremity Electronic Signature(s) Signed: 01/20/2016 3:37:03 PM By: Regan Lemming BSN, RN Entered By: Regan Lemming on 01/20/2016 14:22:17 Andrew [name] ([id number]) -------------------------------------------------------------------------------- Vitals Details Patient Name: Andrew [name] Date of Service: 01/20/2016 2:15  PM Medical Record Number: [id number] Patient Account Number: [id number] Date of Birth/Sex: Jul 15, 1922 (82 y.o. Male) Treating RN: Afful, RN, BSN, [place] Primary Care Physician: Ramonita Lab Other Clinician: Referring Physician: Ramonita Lab Treating Physician/Extender: Frann Rider in Treatment: 1 Vital Signs Time Taken: 14:14 Temperature (F): 97.4 Height (in): 64 Pulse (bpm): 98 Weight (lbs): 195 Respiratory Rate (breaths/min): 20 Body Mass Index (BMI): 33.5 Blood Pressure (mmHg): 142/85 Reference Range: 80 - 120  mg / dl Electronic Signature(s) Signed: 01/20/2016 3:37:03 PM By: Regan Lemming BSN, RN Entered By: Regan Lemming on 01/20/2016 14:16:32

## 2016-01-21 NOTE — Progress Notes (Signed)
Andrew [name], Andrew [name] ([id number]) Visit Report for 01/20/2016 Chief Complaint Document Details Patient Name: Andrew [name], Andrew [name] Date of Service: 01/20/2016 2:15 PM Medical Record Number: [id number] Patient Account Number: [id number] Date of Birth/Sex: [birth year] (82 y.o. Male) Treating RN: Baruch Gouty, RN, BSN, Velva Harman Primary Care Physician: Ramonita Lab Other Clinician: Referring Physician: Ramonita Lab Treating Physician/Extender: Frann Rider in Treatment: 1 Information Obtained from: Patient Chief Complaint Patient presents for treatment of an open ulcer due to venous insufficiency which is had on and off on the right lower extremity for several months Electronic Signature(s) Signed: 01/20/2016 2:56:57 PM By: Christin Fudge MD, FACS Entered By: Christin Fudge on 01/20/2016 14:56:57 Andrew [name] ([id number]) -------------------------------------------------------------------------------- Debridement Details Patient Name: Andrew [name] Date of Service: 01/20/2016 2:15 PM Medical Record Number: [id number] Patient Account Number: [id number] Date of Birth/Sex: [birth year] (82 y.o. Male) Treating RN: Baruch Gouty, RN, BSN, [place] Primary Care Physician: Ramonita Lab Other Clinician: Referring Physician: Ramonita Lab Treating Physician/Extender: Frann Rider in Treatment: 1 Debridement Performed for Wound #1 Right,Medial Lower Leg Assessment: Performed By: Physician Christin Fudge, MD Debridement: Debridement Pre-procedure Yes Verification/Time Out Taken: Start Time: 14:31 Pain Control: Lidocaine 4% Topical Solution Level: Skin/Subcutaneous Tissue Total Area Debrided (L x 1.7 (cm) x 1.5 (cm) = 2.55 (cm) W): Tissue and other Viable, Non-Viable, Fibrin/Slough, Subcutaneous material debrided: Instrument: Curette Bleeding: Minimum Hemostasis Achieved: Pressure End Time: 14:35 Procedural Pain: 0 Post Procedural Pain: 0 Response to Treatment: Procedure was tolerated well Post  Debridement Measurements of Total Wound Length: (cm) 1.7 Width: (cm) 1.5 Depth: (cm) 0.2 Volume: (cm) 0.401 Post Procedure Diagnosis Same as Pre-procedure Electronic Signature(s) Signed: 01/20/2016 2:55:17 PM By: Christin Fudge MD, FACS Signed: 01/20/2016 3:37:03 PM By: Regan Lemming BSN, RN Entered By: Christin Fudge on 01/20/2016 14:55:17 Andrew [name] ([id number]) -------------------------------------------------------------------------------- HPI Details Patient Name: Andrew [name] Date of Service: 01/20/2016 2:15 PM Medical Record Number: [id number] Patient Account Number: [id number] Date of Birth/Sex: [birth year] (82 y.o. Male) Treating RN: Baruch Gouty, RN, BSN, [place] Primary Care Physician: Ramonita Lab Other Clinician: Referring Physician: Ramonita Lab Treating Physician/Extender: Frann Rider in Treatment: 1 History of Present Illness Location: right lower extremity ulceration Quality: Patient reports experiencing a dull pain to affected area(s). Severity: Patient states wound are getting worse. Duration: Patient has had the wound for > 3 months prior to seeking treatment at the wound center Timing: Pain in wound is Intermittent (comes and goes Context: The wound appeared gradually over time Modifying Factors: Other treatment(s) tried include:local care with hydrogen peroxide and alcohol Associated Signs and Symptoms: Patient reports having increase swelling. HPI Description: 82 year old gentleman who was seen by his PCP Dr. Kerrin Mo for a ulceration on the right calf which she's had for several months. The patient also has past medical history significant for hypertension, COPD, chronic kidney disease stage III,anemia, bilateral leg edema, status post appendectomy and prostate surgery. he is a former smoker and has not smoked since 1994 he had a venous duplex study done of the right lower extremity in 2012 which was negative for a deep vein thrombosis. he has not had  an arterial study done at anytime as per the medical records Electronic Signature(s) Signed: 01/20/2016 3:04:12 PM By: Christin Fudge MD, FACS Previous Signature: 01/20/2016 2:58:07 PM Version By: Christin Fudge MD, FACS Entered By: Christin Fudge on 01/20/2016 15:04:12 Andrew [name] ([id number]) -------------------------------------------------------------------------------- Physical Exam Details Patient Name: Andrew [name] Date of Service: 01/20/2016 2:15 PM Medical Record Number: [id number] Patient Account Number: [id number] Date  of Birth/Sex: June 16, 1923 (82 y.o. Male) Treating RN: Baruch Gouty, RN, BSN, [place] Harman Primary Care Physician: Ramonita Lab Other Clinician: Referring Physician: Ramonita Lab Treating Physician/Extender: Frann Rider in Treatment: 1 Constitutional . Pulse regular. Respirations normal and unlabored. Afebrile. . Eyes Nonicteric. Reactive to light. Ears, Nose, Mouth, and Throat Lips, teeth, and gums WNL.Marland Kitchen Moist mucosa without lesions. Neck supple and nontender. No palpable supraclavicular or cervical adenopathy. Normal sized without goiter. Respiratory WNL. No retractions.. Breath sounds WNL, No rubs, rales, rhonchi, or wheeze.. Cardiovascular ABI was noncompressible. Stage 1 lymphedema both lower extremities. Chest Breasts symmetical and no nipple discharge.. Breast tissue WNL, no masses, lumps, or tenderness.. Lymphatic No adneopathy. No adenopathy. No adenopathy. Musculoskeletal Adexa without tenderness or enlargement.. Digits and nails w/o clubbing, cyanosis, infection, petechiae, ischemia, or inflammatory conditions.. Integumentary (Hair, Skin) No suspicious lesions. No crepitus or fluctuance. No peri-wound warmth or erythema. No masses.Marland Kitchen Psychiatric Judgement and insight Intact.. No evidence of depression, anxiety, or agitation.. Notes he has stage lymphedema both lower extremities and on the right lower extremity in the medial part he has a  ulcerated wound which is fairly deep and has subcutaneous debris and I sharply debrided this with a #3 curet.. Bleeding controlled with pressure. Electronic Signature(s) Signed: 01/20/2016 3:05:39 PM By: Christin Fudge MD, FACS Entered By: Christin Fudge on 01/20/2016 15:05:39 Andrew [name] ([id number]) -------------------------------------------------------------------------------- Physician Orders Details Patient Name: Andrew [name] Date of Service: 01/20/2016 2:15 PM Medical Record Number: [id number] Patient Account Number: [id number] Date of Birth/Sex: 1922/11/11 (82 y.o. Male) Treating RN: Baruch Gouty, RN, BSN, [place] Primary Care Physician: Ramonita Lab Other Clinician: Referring Physician: Ramonita Lab Treating Physician/Extender: Frann Rider in Treatment: 1 Verbal / Phone Orders: Yes Clinician: Afful, RN, BSN, Rita Read Back and Verified: No Diagnosis Coding Wound Cleansing Wound #1 Right,Medial Lower Leg o Cleanse wound with mild soap and water o May Shower, gently pat wound dry prior to applying new dressing. o May shower with protection. Primary Wound Dressing Wound #1 Right,Medial Lower Leg o Aquacel Ag Secondary Dressing Wound #1 Right,Medial Lower Leg o Gauze and Kerlix/Conform Dressing Change Frequency Wound #1 Right,Medial Lower Leg o Change dressing every week Follow-up Appointments Wound #1 Right,Medial Lower Leg o Return Appointment in 1 week. Edema Control Wound #1 Right,Medial Lower Leg o 3 Layer Compression System - Right Lower Extremity o Elevate legs to the level of the heart and pump ankles as often as possible Additional Orders / Instructions Wound #1 Right,Medial Lower Leg o Increase protein intake. o Activity as tolerated Electronic Signature(s) Signed: 01/20/2016 3:37:03 PM By: Regan Lemming BSN, RN Andrew [name], Andrew [name]. ([id number]) Signed: 01/20/2016 4:28:14 PM By: Christin Fudge MD, FACS Entered By: Regan Lemming on  01/20/2016 14:35:32 Andrew [name] ([id number]) -------------------------------------------------------------------------------- Problem List Details Patient Name: Andrew [name] Date of Service: 01/20/2016 2:15 PM Medical Record Number: [id number] Patient Account Number: [id number] Date of Birth/Sex: July 16, 1922 (82 y.o. 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 (  [id number]) -------------------------------------------------------------------------------- Progress Note Details Patient Name: Andrew [name], Andrew [name] Date of Service: 01/20/2016 2:15 PM Medical Record Number: [id number] Patient Account Number: [id number] Date of Birth/Sex: 06/24/1923 (82 y.o. Male) Treating RN: Baruch Gouty, RN, BSN, [place] Primary Care Physician: Ramonita Lab Other Clinician: Referring Physician: Ramonita Lab Treating Physician/Extender: Frann Rider in Treatment: 1 Subjective Chief Complaint Information obtained from Patient Patient presents for treatment of an open ulcer due to venous insufficiency which is had on and off on the right lower extremity for several months History of Present Illness (HPI) The following HPI elements were documented for the patient's wound: Location: right lower extremity  ulceration Quality: Patient reports experiencing a dull pain to affected area(s). Severity: Patient states wound are getting worse. Duration: Patient has had the wound for > 3 months prior to seeking treatment at the wound center Timing: Pain in wound is Intermittent (comes and goes Context: The wound appeared gradually over time Modifying Factors: Other treatment(s) tried include:local care with hydrogen peroxide and alcohol Associated Signs and Symptoms: Patient reports having increase swelling. 82 year old gentleman who was seen by his PCP Dr. Kerrin Mo for a ulceration on the right calf which she's had for several months. The patient also has past medical history significant for hypertension, COPD, chronic kidney disease stage III,anemia, bilateral leg edema, status post appendectomy and prostate surgery. he is a former smoker and has not smoked since 1994 he had a venous duplex study done of the right lower extremity in 2012 which was negative for a deep vein thrombosis. he has not had an arterial study done at anytime as per the medical records Medications Spiriva with HandiHaler 18 mcg and inhalation capsules inhalation 1 1 capsule, w/inhalation device inhalation once daily fexofenadine 180 mg tablet oral 1 1 tablet oral daily PreserVision Lutein 226 mg-200 unit-5 mg-0.8 mg capsule oral 1 1 capsule oral two times daily Advair Diskus 250 mcg-50 mcg/dose powder for inhalation inhalation 1 1 blister with device inhalation every twelve hours fluticasone 50 mcg/actuation nasal spray,suspension nasal 2 2 sprays,suspension nasal into both nostrils once daily omeprazole 20 mg capsule,delayed release oral 1 1 capsule,delayed release(DR/EC) oral daily Andrew [name], Andrew [name]. ([id number]) hydrochlorothiazide 12.5 mg tablet oral 1 1 tablet oral daily levothyroxine 75 mcg tablet oral 1 1 tablet oral daily cyanocobalamin (vit B-12) 1,000 mcg tablet oral 1 1 tablet oral daily vitamin E 400 unit  capsule oral 1 1 capsule oral daily Objective Constitutional Pulse regular. Respirations normal and unlabored. Afebrile. Vitals Time Taken: 2:14 PM, Height: 64 in, Weight: 195 lbs, BMI: 33.5, Temperature: 97.4 F, Pulse: 98 bpm, Respiratory Rate: 20 breaths/min, Blood Pressure: 142/85 mmHg. Eyes Nonicteric. Reactive to light. Ears, Nose, Mouth, and Throat Lips, teeth, and gums WNL.Marland Kitchen Moist mucosa without lesions. Neck supple and nontender. No palpable supraclavicular or cervical adenopathy. Normal sized without goiter. Respiratory WNL. No retractions.. Breath sounds WNL, No rubs, rales, rhonchi, or wheeze.. Cardiovascular ABI was noncompressible. Stage 1 lymphedema both lower extremities. Chest Breasts symmetical and no nipple discharge.. Breast tissue WNL, no masses, lumps, or tenderness.. Lymphatic No adneopathy. No adenopathy. No adenopathy. Musculoskeletal Adexa without tenderness or enlargement.. Digits and nails w/o clubbing, cyanosis, infection, petechiae, ischemia, or inflammatory conditions.Marland Kitchen Psychiatric Judgement and insight Intact.. No evidence of depression, anxiety, or agitation.. General Notes: he has stage lymphedema both lower extremities and on the right lower extremity in the medial part he has a ulcerated wound which is fairly deep and has subcutaneous debris and I sharply debrided this with a #3  curet.. Bleeding controlled with pressure. Andrew [name], Andrew [name]. ([id number]) Integumentary (Hair, Skin) No suspicious lesions. No crepitus or fluctuance. No peri-wound warmth or erythema. No masses.. Wound #1 status is Open. Original cause of wound was Gradually Appeared. The wound is located on the Right,Medial Lower Leg. The wound measures 1.7cm length x 1.5cm width x 0.2cm depth; 2.003cm^2 area and 0.401cm^3 volume. The wound is limited to skin breakdown. There is no tunneling or undermining noted. There is a medium amount of serosanguineous drainage noted. The wound  margin is distinct with the outline attached to the wound base. There is small (1-33%) pink granulation within the wound bed. There is a large (67-100%) amount of necrotic tissue within the wound bed including Adherent Slough. The periwound skin appearance exhibited: Localized Edema, Moist. The periwound skin appearance did not exhibit: Callus, Crepitus, Excoriation, Fluctuance, Friable, Induration, Rash, Scarring, Dry/Scaly, Maceration, Atrophie Blanche, Cyanosis, Ecchymosis, Hemosiderin Staining, Mottled, Pallor, Rubor, Erythema. Periwound temperature was noted as No Abnormality. The periwound has tenderness on palpation. 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 Given this option but he is not very keen on getting this done due to social economic reasons He has some compression wraps ordered but I believe we will leave these for later time when he is healed. We will empirically treat him with: 1. Silver alginate and a 3 layer Profore wrap. 2. Elevation and exercise 3. regular visits the wound center 4. She doesn't heal within a reasonable amount of time we may have to pursue with his vascular studies and revisit the topic again. Procedures Wound #1 LEDELL, Andrew [name] ([id number]) Wound #1 is a Venous Leg Ulcer located on the Right,Medial Lower Leg . There was a Skin/Subcutaneous Tissue Debridement [id number]) debridement with total area of 2.55 sq cm performed by Christin Fudge, MD. with the following instrument(s): Curette to remove Viable and Non-Viable tissue/material including Fibrin/Slough and Subcutaneous after achieving pain control  using Lidocaine 4% Topical Solution. A time out was conducted prior to the start of the procedure. A Minimum amount of bleeding was controlled with Pressure. The procedure was tolerated well with a pain level of 0 throughout and a pain level of 0 following the procedure. Post Debridement Measurements: 1.7cm length x 1.5cm width x 0.2cm depth; 0.401cm^3 volume. Post procedure Diagnosis Wound #1: Same as Pre-Procedure Plan Wound Cleansing: Wound #1 Right,Medial Lower Leg: Cleanse wound with mild soap and water May Shower, gently pat wound dry prior to applying new dressing. May shower with protection. Primary Wound Dressing: Wound #1 Right,Medial Lower Leg: Aquacel Ag Secondary Dressing: Wound #1 Right,Medial Lower Leg: Gauze and Kerlix/Conform Dressing Change Frequency: Wound #1 Right,Medial Lower Leg: Change dressing every week Follow-up Appointments: Wound #1 Right,Medial Lower Leg: Return Appointment in 1 week. Edema Control: Wound #1 Right,Medial Lower Leg: 3 Layer Compression System - Right Lower Extremity Elevate legs to the level of the heart and pump ankles as often as possible Additional Orders / Instructions: Wound #1 Right,Medial Lower Leg: Increase protein intake. Activity as tolerated Andrew [name], Andrew [name] ([id number]) 83 year old gentleman who is a poor historian but after thoroughly reviewing his medical records I do not believe he has had an arterial venous duplex study done  at all. Given this option but he is not very keen on getting this done due to social economic reasons He has some compression wraps ordered but I believe we will leave these for later time when he is healed. We will empirically treat him with: 1. Silver alginate and a 3 layer Profore wrap. 2. Elevation and exercise 3. regular visits the wound center 4. If doesn't heal within a reasonable amount of time we may have to pursue with his vascular studies and revisit the topic again. we will order  arterial and venous duplex studies for him Electronic Signature(s) Signed: 01/20/2016 3:09:07 PM By: Christin Fudge MD, FACS Entered By: Christin Fudge on 01/20/2016 15:09:07 Andrew [name] ([id number]) -------------------------------------------------------------------------------- SuperBill Details Patient Name: Andrew [name] Date of Service: 01/20/2016 Medical Record Number: [id number] Patient Account Number: [id number] Date of Birth/Sex: 11-28-1922 (82 y.o. 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 Facility Procedures CPT4: Description Modifier Quantity Code [id number] - DEB SUBQ TISSUE 20 SQ CM/< 1 ICD-10 Description Diagnosis I89.0 Lymphedema, not elsewhere classified I73.9 Peripheral vascular disease, unspecified I83.212 Varicose veins of right lower extremity  with both ulcer of calf and inflammation Physician Procedures CPT4: Description Modifier Quantity Code [id number] - WC PHYS SUBQ TISS 20 SQ CM 1 ICD-10 Description Diagnosis I89.0 Lymphedema, not elsewhere classified I73.9 Peripheral vascular disease, unspecified I83.212 Varicose veins of right lower extremity  with both ulcer of calf and inflammation Electronic Signature(s) Signed: 01/20/2016 3:09:22 PM By: Christin Fudge MD, FACS Entered By: Christin Fudge on 01/20/2016 15:09:22

## 2016-01-22 NOTE — Progress Notes (Signed)
Andrew [name], Andrew [name] ([id number]) Visit Report for 01/21/2016 Arrival Information Details Patient Name: Andrew [name], Andrew [name] Date of Service: 01/21/2016 10:30 AM Medical Record Number: [id number] Patient Account Number: [id number] Date of Birth/Sex: [birth year] (82 y.o. Male) Treating RN: Baruch Gouty, RN, BSN, Velva Harman Primary Care Physician: Ramonita Lab Other Clinician: Referring Physician: Ramonita Lab Treating Physician/Extender: Benjaman Pott in Treatment: 1 Visit Information History Since Last Visit Added or deleted any medications: No Patient Arrived: Cane Any new allergies or adverse reactions: No Arrival Time: 10:31 Had a fall or experienced change in No Accompanied By: self activities of daily living that may affect Transfer Assistance: None risk of falls: Patient Identification Verified: Yes Signs or symptoms of abuse/neglect since last No Secondary Verification Process Yes visito Completed: Hospitalized since last visit: No Patient Requires Transmission- No Has Dressing in Place as Prescribed: Yes Based Precautions: Has Compression in Place as Prescribed: Yes Patient Has Alerts: Yes Pain Present Now: No Patient Alerts: ABI Non Compressible Electronic Signature(s) Signed: 01/21/2016 2:54:06 PM By: Regan Lemming BSN, RN Entered By: Regan Lemming on 01/21/2016 10:31:33 Andrew [name] ([id number]) -------------------------------------------------------------------------------- Encounter Discharge Information Details Patient Name: Andrew [name] Date of Service: 01/21/2016 10:30 AM Medical Record Number: [id number] Patient Account Number: [id number] Date of Birth/Sex: 1923-01-20 (82 y.o. Male) Treating RN: Baruch Gouty, RN, BSN, Velva Harman Primary Care Physician: Ramonita Lab Other Clinician: Referring Physician: Ramonita Lab Treating Physician/Extender: Benjaman Pott in Treatment: 1 Encounter Discharge Information Items Discharge Pain Level: 0 Discharge Condition:  Stable Ambulatory Status: Cane Discharge Destination: Home Private Transportation: Auto Accompanied By: self Schedule Follow-up Appointment: No Medication Reconciliation completed and No provided to Patient/Care Chace Klippel: Clinical Summary of Care: Electronic Signature(s) Signed: 01/21/2016 2:54:06 PM By: Regan Lemming BSN, RN Entered By: Regan Lemming on 01/21/2016 10:33:12 Andrew [name] ([id number]) -------------------------------------------------------------------------------- Patient/Caregiver Education Details Patient Name: Andrew [name] Date of Service: 01/21/2016 10:30 AM Medical Record Number: [id number] Patient Account Number: [id number] Date of Birth/Gender: 1923-06-13 (82 y.o. Male) Treating RN: Baruch Gouty, RN, BSN, [place] Primary Care Physician: Ramonita Lab Other Clinician: Referring Physician: Ramonita Lab Treating Physician/Extender: Benjaman Pott in Treatment: 1 Education Assessment Education Provided To: Patient and Caregiver Education Topics Provided Venous: Methods: Explain/Verbal Responses: State content correctly Wound/Skin Impairment: Methods: Explain/Verbal Responses: State content correctly Electronic Signature(s) Signed: 01/21/2016 2:54:06 PM By: Regan Lemming BSN, RN Entered By: Regan Lemming on 01/21/2016 10:32:57 Andrew [name] ([id number]) -------------------------------------------------------------------------------- Wound Assessment Details Patient Name: Andrew [name] Date of Service: 01/21/2016 10:30 AM Medical Record Number: [id number] Patient Account Number: [id number] Date of Birth/Sex: 1923-05-06 (82 y.o. Male) Treating RN: Baruch Gouty, RN, BSN, [place] Primary Care Physician: Ramonita Lab Other Clinician: Referring Physician: Ramonita Lab Treating Physician/Extender: Judene Companion Weeks in Treatment: 1 Wound Status Wound Number: 1 Primary Venous Leg Ulcer Etiology: Wound Location: Right Lower Leg - Medial Wound Open Wounding Event:  Gradually Appeared Status: Date Acquired: 12/14/2015 Comorbid Cataracts, Anemia, Chronic Obstructive Weeks Of Treatment: 1 History: Pulmonary Disease (COPD), Clustered Wound: No Arrhythmia, Hypertension, Peripheral Venous Disease, Osteoarthritis Wound Measurements Length: (cm) 1.7 Width: (cm) 1.5 Depth: (cm) 0.2 Area: (cm) 2.003 Volume: (cm) 0.401 % Reduction in Area: 36.3% % Reduction in Volume: 36.1% Epithelialization: None Tunneling: No Undermining: No Wound Description Full Thickness Without Exposed Classification: Support Structures Wound Margin: Distinct, outline attached Exudate Medium Amount: Exudate Type: Serosanguineous Exudate Color: red, brown Foul Odor After Cleansing: No Wound Bed Granulation Amount: Small (1-33%) Exposed Structure Granulation Quality: Pink Fascia Exposed: No Necrotic Amount: Large (67-100%) Fat Layer Exposed: No  Necrotic Quality: Adherent Slough Tendon Exposed: No Muscle Exposed: No Joint Exposed: No Bone Exposed: No Limited to Skin Breakdown Periwound Skin Texture Texture Color No Abnormalities Noted: No No Abnormalities Noted: No TYRIN, MULLANEY. ([id number]) Callus: No Atrophie Blanche: No Crepitus: No Cyanosis: No Excoriation: No Ecchymosis: No Fluctuance: No Erythema: No Friable: No Hemosiderin Staining: No Induration: No Mottled: No Localized Edema: Yes Pallor: No Rash: No Rubor: No Scarring: No Temperature / Pain Moisture Temperature: No Abnormality No Abnormalities Noted: No Tenderness on Palpation: Yes Dry / Scaly: No Maceration: No Moist: Yes Wound Preparation Ulcer Cleansing: Rinsed/Irrigated with Saline Topical Anesthetic Applied: Other: lidocaine 4%, Treatment Notes Wound #1 (Right, Medial Lower Leg) 1. Cleansed with: Clean wound with Normal Saline 4. Dressing Applied: Aquacel Ag 5. Secondary Dressing Applied ABD Pad 7. Secured with 3 Layer Compression System - Right Lower  Extremity Electronic Signature(s) Signed: 01/21/2016 2:54:06 PM By: Regan Lemming BSN, RN Entered By: Regan Lemming on 01/21/2016 10:31:58

## 2016-01-27 ENCOUNTER — Other Ambulatory Visit: Payer: Self-pay | Admitting: Surgery

## 2016-01-27 ENCOUNTER — Encounter: Payer: Medicare Other | Admitting: Surgery

## 2016-01-27 DIAGNOSIS — I87311 Chronic venous hypertension (idiopathic) with ulcer of right lower extremity: Secondary | ICD-10-CM | POA: Diagnosis not present

## 2016-01-27 DIAGNOSIS — L97909 Non-pressure chronic ulcer of unspecified part of unspecified lower leg with unspecified severity: Secondary | ICD-10-CM

## 2016-01-27 DIAGNOSIS — M7989 Other specified soft tissue disorders: Secondary | ICD-10-CM

## 2016-01-28 ENCOUNTER — Ambulatory Visit: Payer: Medicare Other

## 2016-01-28 DIAGNOSIS — M7989 Other specified soft tissue disorders: Secondary | ICD-10-CM | POA: Diagnosis not present

## 2016-01-28 DIAGNOSIS — L97909 Non-pressure chronic ulcer of unspecified part of unspecified lower leg with unspecified severity: Secondary | ICD-10-CM | POA: Diagnosis not present

## 2016-01-28 NOTE — Progress Notes (Signed)
ABAAN, SCHURMAN ([id number]) Visit Report for 01/27/2016 Arrival Information Details Patient Name: Andrew [name], Andrew [name] Date of Service: 01/27/2016 3:00 PM Medical Record Number: [id number] Patient Account Number: [id number] Date of Birth/Sex: Apr 02, 1923 (82 y.o. Male) Treating RN: Baruch Gouty, RN, BSN, Velva Harman Primary Care Physician: Ramonita Lab Other Clinician: Referring Physician: Ramonita Lab Treating Physician/Extender: Frann Rider in Treatment: 2 Visit Information History Since Last Visit Added or deleted any medications: No Patient Arrived: Andrew [name] Any new allergies or adverse reactions: No Arrival Time: 15:00 Had a fall or experienced change in No Accompanied By: self activities of daily living that may affect Transfer Assistance: None risk of falls: Patient Identification Verified: Yes Signs or symptoms of abuse/neglect since last No Secondary Verification Process Yes visito Completed: Hospitalized since last visit: No Patient Requires Transmission- No Has Dressing in Place as Prescribed: Yes Based Precautions: Has Compression in Place as Prescribed: Yes Patient Has Alerts: Yes Pain Present Now: No Patient Alerts: ABI Non Compressible Electronic Signature(s) Signed: 01/27/2016 4:44:30 PM By: Regan Lemming BSN, RN Entered By: Regan Lemming on 01/27/2016 15:01:14 Andrew [name] ([id number]) -------------------------------------------------------------------------------- Clinic Level of Care Assessment Details Patient Name: Andrew [name] Date of Service: 01/27/2016 3:00 PM Medical Record Number: [id number] Patient Account Number: [id number] Date of Birth/Sex: Feb 23, 1923 (82 y.o. Male) Treating RN: Baruch Gouty, RN, BSN, [place] Primary Care Physician: Ramonita Lab Other Clinician: Referring Physician: Ramonita Lab Treating Physician/Extender: Frann Rider in Treatment: 2 Clinic Level of Care Assessment Items TOOL 4 Quantity Score []  - Use when only an EandM is performed  on FOLLOW-UP visit 0 ASSESSMENTS - Nursing Assessment / Reassessment X - Reassessment of Co-morbidities (includes updates in patient status) 1 10 X - Reassessment of Adherence to Treatment Plan 1 5 ASSESSMENTS - Wound and Skin Assessment / Reassessment X - Simple Wound Assessment / Reassessment - one wound 1 5 []  - Complex Wound Assessment / Reassessment - multiple wounds 0 []  - Dermatologic / Skin Assessment (not related to wound area) 0 ASSESSMENTS - Focused Assessment []  - Circumferential Edema Measurements - multi extremities 0 []  - Nutritional Assessment / Counseling / Intervention 0 X - Lower Extremity Assessment (monofilament, tuning fork, pulses) 1 5 []  - Peripheral Arterial Disease Assessment (using hand held doppler) 0 ASSESSMENTS - Ostomy and/or Continence Assessment and Care []  - Incontinence Assessment and Management 0 []  - Ostomy Care Assessment and Management (repouching, etc.) 0 PROCESS - Coordination of Care X - Simple Patient / Family Education for ongoing care 1 15 []  - Complex (extensive) Patient / Family Education for ongoing care 0 []  - Staff obtains Programmer, systems, Records, Test Results / Process Orders 0 []  - Staff telephones HHA, Nursing Homes / Clarify orders / etc 0 []  - Routine Transfer to another Facility (non-emergent condition) 0 Andrew [name], Andrew [name] ([id number]) []  - Routine Hospital Admission (non-emergent condition) 0 []  - New Admissions / Biomedical engineer / Ordering NPWT, Apligraf, etc. 0 []  - Emergency Hospital Admission (emergent condition) 0 []  - Simple Discharge Coordination 0 []  - Complex (extensive) Discharge Coordination 0 PROCESS - Special Needs []  - Pediatric / Minor Patient Management 0 []  - Isolation Patient Management 0 []  - Hearing / Language / Visual special needs 0 []  - Assessment of Community assistance (transportation, D/C planning, etc.) 0 []  - Additional assistance / Altered mentation 0 []  - Support Surface(s) Assessment (bed,  cushion, seat, etc.) 0 INTERVENTIONS - Wound Cleansing / Measurement X - Simple Wound Cleansing - one wound 1 5 []  - Complex Wound Cleansing -  multiple wounds 0 X - Wound Imaging (photographs - any number of wounds) 1 5 []  - Wound Tracing (instead of photographs) 0 X - Simple Wound Measurement - one wound 1 5 []  - Complex Wound Measurement - multiple wounds 0 INTERVENTIONS - Wound Dressings X - Small Wound Dressing one or multiple wounds 1 10 []  - Medium Wound Dressing one or multiple wounds 0 []  - Large Wound Dressing one or multiple wounds 0 []  - Application of Medications - topical 0 []  - Application of Medications - injection 0 INTERVENTIONS - Miscellaneous []  - External ear exam 0 Andrew [name], [name] ([id number]) []  - Specimen Collection (cultures, biopsies, blood, body fluids, etc.) 0 []  - Specimen(s) / Culture(s) sent or taken to Lab for analysis 0 []  - Patient Transfer (multiple staff / Harrel Lemon Lift / Similar devices) 0 []  - Simple Staple / Suture removal (25 or less) 0 []  - Complex Staple / Suture removal (26 or more) 0 []  - Hypo / Hyperglycemic Management (close monitor of Blood Glucose) 0 []  - Ankle / Brachial Index (ABI) - do not check if billed separately 0 X - Vital Signs 1 5 Has the patient been seen at the hospital within the last three years: Yes Total Score: 70 Level Of Care: New/Established - Level 2 Electronic Signature(s) Signed: 01/27/2016 4:44:30 PM By: Regan Lemming BSN, RN Entered By: Regan Lemming on 01/27/2016 15:32:56 Andrew [name] ([id number]) -------------------------------------------------------------------------------- Encounter Discharge Information Details Patient Name: Andrew [name] Date of Service: 01/27/2016 3:00 PM Medical Record Number: [id number] Patient Account Number: [id number] Date of Birth/Sex: 12-25-1922 (82 y.o. Male) Treating RN: Baruch Gouty, RN, BSN, Velva Harman Primary Care Physician: Ramonita Lab Other Clinician: Referring Physician: Ramonita Lab Treating Physician/Extender: Frann Rider in Treatment: 2 Encounter Discharge Information Items Discharge Pain Level: 0 Discharge Condition: Stable Ambulatory Status: Cane Discharge Destination: Home Transportation: Private Auto Accompanied By: self Schedule Follow-up Appointment: No Medication Reconciliation completed No and provided to Patient/Care Greydis Stlouis: Provided on Clinical Summary of Care: 01/27/2016 Form Type Recipient Paper Patient CB Electronic Signature(s) Signed: 01/27/2016 4:44:30 PM By: Regan Lemming BSN, RN Previous Signature: 01/27/2016 3:31:12 PM Version By: Ruthine Dose Entered By: Regan Lemming on 01/27/2016 15:36:11 Andrew [name] ([id number]) -------------------------------------------------------------------------------- Lower Extremity Assessment Details Patient Name: Andrew [name] Date of Service: 01/27/2016 3:00 PM Medical Record Number: [id number] Patient Account Number: [id number] Date of Birth/Sex: 12/18/1922 (82 y.o. Male) Treating RN: Baruch Gouty, RN, BSN, [place] Primary Care Physician: Ramonita Lab Other Clinician: Referring Physician: Ramonita Lab Treating Physician/Extender: Frann Rider in Treatment: 2 Edema Assessment Assessed: [Left: No] [Right: No] E[Left: dema] [Right: :] Calf Left: Right: Point of Measurement: 38 cm From Medial Instep cm 37 cm Ankle Left: Right: Point of Measurement: 10 cm From Medial Instep cm 25 cm Vascular Assessment Claudication: Claudication Assessment [Right:None] Pulses: Posterior Tibial Dorsalis Pedis Palpable: [Right:Yes] Extremity colors, hair growth, and conditions: Extremity Color: [Right:Mottled] Hair Growth on Extremity: [Right:No] Temperature of Extremity: [Right:Warm] Capillary Refill: [Right:< 3 seconds] Electronic Signature(s) Signed: 01/27/2016 4:44:30 PM By: Regan Lemming BSN, RN Entered By: Regan Lemming on 01/27/2016 15:01:45 Andrew [name]  ([id number]) -------------------------------------------------------------------------------- Multi Wound Chart Details Patient Name: Andrew [name] Date of Service: 01/27/2016 3:00 PM Medical Record Number: [id number] Patient Account Number: [id number] Date of Birth/Sex: 1922/11/10 (82 y.o. Male) Treating RN: Baruch Gouty, RN, BSN, [place] Primary Care Physician: Ramonita Lab Other Clinician: Referring Physician: Ramonita Lab Treating Physician/Extender: Frann Rider in Treatment: 2 Vital Signs Height(in): 64 Pulse(bpm): 90 Weight(lbs): 195 Blood  Pressure 166/90 (mmHg): Body Mass Index(BMI): 33 Temperature(F): 97.6 Respiratory Rate 18 (breaths/min): Photos: [1:No Photos] [N/A:N/A] Wound Location: [1:Right Lower Leg - Medial] [N/A:N/A] Wounding Event: [1:Gradually Appeared] [N/A:N/A] Primary Etiology: [1:Venous Leg Ulcer] [N/A:N/A] Comorbid History: [1:Cataracts, Anemia, Chronic Obstructive Pulmonary Disease (COPD), Arrhythmia, Hypertension, Peripheral Venous Disease, Osteoarthritis] [N/A:N/A] Date Acquired: [1:12/14/2015] [N/A:N/A] Weeks of Treatment: [1:2] [N/A:N/A] Wound Status: [1:Open] [N/A:N/A] Measurements L x W x D 1.3x1.3x0.2 [N/A:N/A] (cm) Area (cm) : [1:1.327] [N/A:N/A] Volume (cm) : [1:0.265] [N/A:N/A] % Reduction in Area: [1:57.80%] [N/A:N/A] % Reduction in Volume: 57.80% [N/A:N/A] Classification: [1:Full Thickness Without Exposed Support Structures] [N/A:N/A] Exudate Amount: [1:Medium] [N/A:N/A] Exudate Type: [1:Serosanguineous] [N/A:N/A] Exudate Color: [1:red, brown] [N/A:N/A] Wound Margin: [1:Distinct, outline attached] [N/A:N/A] Granulation Amount: [1:Medium (34-66%)] [N/A:N/A] Granulation Quality: [1:Pink] [N/A:N/A] Necrotic Amount: [1:Medium (34-66%)] [N/A:N/A] Exposed Structures: Fascia: No N/A N/A Fat: No Tendon: No Muscle: No Joint: No Bone: No Limited to Skin Breakdown Epithelialization: None N/A N/A Periwound Skin Texture: Edema: Yes N/A  N/A Excoriation: No Induration: No Callus: No Crepitus: No Fluctuance: No Friable: No Rash: No Scarring: No Periwound Skin Moist: Yes N/A N/A Moisture: Maceration: No Dry/Scaly: No Periwound Skin Color: Atrophie Blanche: No N/A N/A Cyanosis: No Ecchymosis: No Erythema: No Hemosiderin Staining: No Mottled: No Pallor: No Rubor: No Temperature: No Abnormality N/A N/A Tenderness on Yes N/A N/A Palpation: Wound Preparation: Ulcer Cleansing: N/A N/A Rinsed/Irrigated with Saline Topical Anesthetic Applied: Other: lidocaine 4% Treatment Notes Electronic Signature(s) Signed: 01/27/2016 4:44:30 PM By: Regan Lemming BSN, RN Entered By: Regan Lemming on 01/27/2016 15:08:06 Andrew [name] ([id number]) -------------------------------------------------------------------------------- [place] Details Patient Name: Andrew [name] Date of Service: 01/27/2016 3:00 PM Medical Record Number: [id number] Patient Account Number: [id number] Date of Birth/Sex: [birth year] (82 y.o. Male) Treating RN: Baruch Gouty, RN, BSN, Velva Harman Primary Care Physician: Ramonita Lab Other Clinician: Referring Physician: Ramonita Lab Treating Physician/Extender: Frann Rider in Treatment: 2 Active Inactive Venous Leg Ulcer Nursing Diagnoses: Knowledge deficit related to disease process and management Potential for venous Insuffiency (use before diagnosis confirmed) Goals: Non-invasive venous studies are completed as ordered Date Initiated: 01/13/2016 Goal Status: Active Patient will maintain optimal edema control Date Initiated: 01/13/2016 Goal Status: Active Patient/caregiver will verbalize understanding of disease process and disease management Date Initiated: 01/13/2016 Goal Status: Active Verify adequate tissue perfusion prior to therapeutic compression application Date Initiated: 01/13/2016 Goal Status: Active Interventions: Assess peripheral edema status every  visit. Compression as ordered Provide education on venous insufficiency Notes: Wound/Skin Impairment Nursing Diagnoses: Impaired tissue integrity Knowledge deficit related to ulceration/compromised skin integrity Goals: Patient/caregiver will verbalize understanding of skin care regimen Date Initiated: 01/13/2016 Andrew [name] ([id number]) Goal Status: Active Ulcer/skin breakdown will have a volume reduction of 30% by week 4 Date Initiated: 01/13/2016 Goal Status: Active Ulcer/skin breakdown will have a volume reduction of 50% by week 8 Date Initiated: 01/13/2016 Goal Status: Active Ulcer/skin breakdown will have a volume reduction of 80% by week 12 Date Initiated: 01/13/2016 Goal Status: Active Ulcer/skin breakdown will heal within 14 weeks Date Initiated: 01/13/2016 Goal Status: Active Interventions: Assess patient/caregiver ability to obtain necessary supplies Assess patient/caregiver ability to perform ulcer/skin care regimen upon admission and as needed Assess ulceration(s) every visit Provide education on ulcer and skin care Notes: Electronic Signature(s) Signed: 01/27/2016 4:44:30 PM By: Regan Lemming BSN, RN Entered By: Regan Lemming on 01/27/2016 15:07:58 Andrew [name] ([id number]) -------------------------------------------------------------------------------- Pain Assessment Details Patient Name: Andrew [name] Date of Service: 01/27/2016 3:00 PM Medical Record Number: [id number] Patient Account Number: [id number]  Date of Birth/Sex: [birth year] (82 y.o. Male) Treating RN: Afful, RN, BSN, [place] Primary Care Physician: Ramonita Lab Other Clinician: Referring Physician: Ramonita Lab Treating Physician/Extender: Frann Rider in Treatment: 2 Active Problems Location of Pain Severity and Description of Pain Patient Has Paino No Site Locations With Dressing Change: No Pain Management and Medication Current Pain Management: Electronic Signature(s) Signed:  01/27/2016 4:44:30 PM By: Regan Lemming BSN, RN Entered By: Regan Lemming on 01/27/2016 15:01:22 Andrew [name] ([id number]) -------------------------------------------------------------------------------- Patient/Caregiver Education Details Patient Name: Andrew [name] Date of Service: 01/27/2016 3:00 PM Medical Record Number: [id number] Patient Account Number: [id number] Date of Birth/Gender: 05-18-1923 (82 y.o. Male) Treating RN: Baruch Gouty, RN, BSN, [place] Primary Care Physician: Ramonita Lab Other Clinician: Referring Physician: Ramonita Lab Treating Physician/Extender: Frann Rider in Treatment: 2 Education Assessment Education Provided To: Patient Education Topics Provided Venous: Methods: Explain/Verbal Responses: State content correctly Wound/Skin Impairment: Methods: Explain/Verbal Responses: State content correctly Electronic Signature(s) Signed: 01/27/2016 4:44:30 PM By: Regan Lemming BSN, RN Entered By: Regan Lemming on 01/27/2016 15:36:24 Andrew [name] ([id number]) -------------------------------------------------------------------------------- Wound Assessment Details Patient Name: Andrew [name] Date of Service: 01/27/2016 3:00 PM Medical Record Number: [id number] Patient Account Number: [id number] Date of Birth/Sex: April 08, 1923 (82 y.o. Male) Treating RN: Baruch Gouty, RN, BSN, [place] Primary Care Physician: Ramonita Lab Other Clinician: Referring Physician: Ramonita Lab Treating Physician/Extender: Frann Rider in Treatment: 2 Wound Status Wound Number: 1 Primary Venous Leg Ulcer Etiology: Wound Location: Right Lower Leg - Medial Wound Open Wounding Event: Gradually Appeared Status: Date Acquired: 12/14/2015 Comorbid Cataracts, Anemia, Chronic Obstructive Weeks Of Treatment: 2 History: Pulmonary Disease (COPD), Clustered Wound: No Arrhythmia, Hypertension, Peripheral Venous Disease, Osteoarthritis Photos Photo Uploaded By: Regan Lemming on 01/27/2016  16:18:34 Wound Measurements Length: (cm) 1.3 Width: (cm) 1.3 Depth: (cm) 0.2 Area: (cm) 1.327 Volume: (cm) 0.265 % Reduction in Area: 57.8% % Reduction in Volume: 57.8% Epithelialization: None Tunneling: No Undermining: No Wound Description Full Thickness Without Exposed Classification: Support Structures Wound Margin: Distinct, outline attached [place], Tamar D. ([id number]) Foul Odor After Cleansing: No Exudate Medium Amount: Exudate Type: Serosanguineous Exudate Color: red, brown Wound Bed Granulation Amount: Medium (34-66%) Exposed Structure Granulation Quality: Pink Fascia Exposed: No Necrotic Amount: Medium (34-66%) Fat Layer Exposed: No Necrotic Quality: Adherent Slough Tendon Exposed: No Muscle Exposed: No Joint Exposed: No Bone Exposed: No Limited to Skin Breakdown Periwound Skin Texture Texture Color No Abnormalities Noted: No No Abnormalities Noted: No Callus: No Atrophie Blanche: No Crepitus: No Cyanosis: No Excoriation: No Ecchymosis: No Fluctuance: No Erythema: No Friable: No Hemosiderin Staining: No Induration: No Mottled: No Localized Edema: Yes Pallor: No Rash: No Rubor: No Scarring: No Temperature / Pain Moisture Temperature: No Abnormality No Abnormalities Noted: No Tenderness on Palpation: Yes Dry / Scaly: No Maceration: No Moist: Yes Wound Preparation Ulcer Cleansing: Rinsed/Irrigated with Saline Topical Anesthetic Applied: Other: lidocaine 4%, Treatment Notes Wound #1 (Right, Medial Lower Leg) 1. Cleansed with: Clean wound with Normal Saline 4. Dressing Applied: Medihoney Gel 5. Secondary Dressing Applied Gauze and Kerlix/Conform 7. Secured with MAN, MOM ([id number]) Patient to wear own compression stockings Electronic Signature(s) Signed: 01/27/2016 4:44:30 PM By: Regan Lemming BSN, RN Entered By: Regan Lemming on 01/27/2016 15:07:54 Andrew [name]  ([id number]) -------------------------------------------------------------------------------- Vit[place] Details Patient Name: Andrew [name] Date of Service: 01/27/2016 3:00 PM Medical Record Number: [id number] Patient Account Number: [id number] Date of Birth/Sex: 19-Oct-1922 (82 y.o. Male) Treating RN: Baruch Gouty, RN, BSN, Vel[place] Harman Primary Care Physician: Ramonita Lab Other Clinician: Referring Physician: Caryl Comes  BERT Treating Physician/Extender: Frann Rider in Treatment: 2 Vital Signs Time Taken: 15:03 Temperature (F): 97.6 Height (in): 64 Pulse (bpm): 90 Weight (lbs): 195 Respiratory Rate (breaths/min): 18 Body Mass Index (BMI): 33.5 Blood Pressure (mmHg): 166/90 Reference Range: 80 - 120 mg / dl Electronic Signature(s) Signed: 01/27/2016 4:44:30 PM By: Regan Lemming BSN, RN Entered By: Regan Lemming on 01/27/2016 15:04:06

## 2016-01-28 NOTE — Progress Notes (Addendum)
Andrew, [name] ([id number]) Visit Report for 01/27/2016 Chief Complaint Document Details Patient Name: Andrew [name], Andrew [name] Date of Service: 01/27/2016 3:00 PM Medical Record Number: [id number] Patient Account Number: [id number] Date of Birth/Sex: [birth year] (82 y.o. Male) Treating RN: Baruch Gouty, RN, BSN, Velva Harman Primary Care Physician: Ramonita Lab Other Clinician: Referring Physician: Ramonita Lab Treating Physician/Extender: Frann Rider in Treatment: 2 Information Obtained from: Patient Chief Complaint Patient presents for treatment of an open ulcer due to venous insufficiency which is had on and off on the right lower extremity for several months Electronic Signature(s) Signed: 01/27/2016 3:25:30 PM By: Christin Fudge MD, FACS Entered By: Christin Fudge on 01/27/2016 15:25:30 Andrew [name] ([id number]) -------------------------------------------------------------------------------- HPI Details Patient Name: Andrew [name] Date of Service: 01/27/2016 3:00 PM Medical Record Number: [id number] Patient Account Number: [id number] Date of Birth/Sex: 10-28-1922 (82 y.o. Male) Treating RN: Baruch Gouty, RN, BSN, Velva Harman Primary Care Physician: Ramonita Lab Other Clinician: Referring Physician: Ramonita Lab Treating Physician/Extender: Frann Rider in Treatment: 2 History of Present Illness Location: right lower extremity ulceration Quality: Patient reports experiencing a dull pain to affected area(s). Severity: Patient states wound are getting worse. Duration: Patient has had the wound for > 3 months prior to seeking treatment at the wound center Timing: Pain in wound is Intermittent (comes and goes Context: The wound appeared gradually over time Modifying Factors: Other treatment(s) tried include:local care with hydrogen peroxide and alcohol Associated Signs and Symptoms: Patient reports having increase swelling. HPI Description: 82 year old gentleman who was seen by his PCP Dr. Kerrin Mo for a ulceration on the right calf which she's had for several months. The patient also has past medical history significant for hypertension, COPD, chronic kidney disease stage III,anemia, bilateral leg edema, status post appendectomy and prostate surgery. he is a former smoker and has not smoked since 1994 he had a venous duplex study done of the right lower extremity in 2012 which was negative for a deep vein thrombosis. he has not had an arterial study done at anytime as per the medical records 01/27/2016 -- he recently had workup done at [facility] and his lower extremity venous duplex for reflux showed chronic deep vein thrombosis in the left femoral and popliteal veins, no evidence of lower extremity superficial thrombophlebitis bilaterally and no incompetence of the greater small saphenous veins bilaterally. Lower extremity arterial study showed resting ABI of 0.62 on the right and 1.0 on the left. They Have recommended a right lower extremity angiogram. Electronic Signature(s) Signed: 01/27/2016 3:25:48 PM By: Christin Fudge MD, FACS Previous Signature: 01/27/2016 3:24:43 PM Version By: Christin Fudge MD, FACS Previous Signature: 01/27/2016 3:15:25 PM Version By: Christin Fudge MD, FACS Entered By: Christin Fudge on 01/27/2016 15:25:48 Andrew [name] ([id number]) -------------------------------------------------------------------------------- Physical Exam Details Patient Name: Andrew [name] Date of Service: 01/27/2016 3:00 PM Medical Record Number: [id number] Patient Account Number: [id number] Date of Birth/Sex: [birth year] (82 y.o. Male) Treating RN: Baruch Gouty, RN, BSN, Velva Harman Primary Care Physician: Ramonita Lab Other Clinician: Referring Physician: Ramonita Lab Treating Physician/Extender: Frann Rider in Treatment: 2 Constitutional . Pulse regular. Respirations normal and unlabored. Afebrile. . Eyes Nonicteric. Reactive to light. Ears, Nose,  Mouth, and Throat Lips, teeth, and gums WNL.Marland Kitchen Moist mucosa without lesions. Neck supple and nontender. No palpable supraclavicular or cervical adenopathy. Normal sized without goiter. Respiratory WNL. No retractions.. Breath sounds WNL, No rubs, rales, rhonchi, or wheeze.. Cardiovascular Heart rhythm and rate regular, no murmur or gallop.. Pedal Pulses WNL. No clubbing, cyanosis  or edema. Lymphatic No adneopathy. No adenopathy. No adenopathy. Musculoskeletal Adexa without tenderness or enlargement.. Digits and nails w/o clubbing, cyanosis, infection, petechiae, ischemia, or inflammatory conditions.. Integumentary (Hair, Skin) No suspicious lesions. No crepitus or fluctuance. No peri-wound warmth or erythema. No masses.Marland Kitchen Psychiatric Judgement and insight Intact.. No evidence of depression, anxiety, or agitation.. Notes emphysema is much better and he's been wearing his juxta light compression stockings. He was unable to tolerate the 3 layer Profore wrap. Ulcer looks fairly clean and no sharp debridement was required today Electronic Signature(s) Signed: 01/27/2016 3:26:30 PM By: Christin Fudge MD, FACS Entered By: Christin Fudge on 01/27/2016 15:26:29 Andrew [name] ([id number]) -------------------------------------------------------------------------------- Physician Orders Details Patient Name: Andrew [name] Date of Service: 01/27/2016 3:00 PM Medical Record Number: [id number] Patient Account Number: [id number] Date of Birth/Sex: [birth year] (82 y.o. 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 o May shower with protection. Primary Wound Dressing Wound #1 Right,Medial Lower Leg o Medihoney gel Secondary Dressing Wound #1 Right,Medial Lower Leg o Gauze and Kerlix/Conform Dressing Change Frequency Wound #1 Right,Medial Lower Leg o Change dressing every day. Follow-up Appointments Wound #1 Right,Medial Lower Leg o Return Appointment in 1 week. Edema Control Wound #1 Right,Medial Lower Leg o Patient to wear own Juxtalite/Juzo compression garment. o Elevate legs to the level of the heart and pump ankles as often as possible Overbaugh, Iban D. ([id number]) Additional Orders / Instructions Wound #1 Right,Medial Lower Leg o Increase protein intake. o Activity as tolerated Electronic Signature(s) Signed: 01/27/2016 4:12:41 PM By: Christin Fudge MD, FACS Signed: 01/27/2016 4:44:30 PM By: Regan Lemming BSN, RN Entered By: Regan Lemming on 01/27/2016 15:30:10 Andrew [name] ([id number]) -------------------------------------------------------------------------------- Problem List Details Patient Name: Andrew [name] Date of Service: 01/27/2016 3:00 PM Medical Record Number: [id number] Patient Account Number: [id number] Date of Birth/Sex: 09/19/1922 (82 y.o. Male) Treating RN: Baruch Gouty, RN, BSN, [place] Primary Care Physician: Ramonita Lab Other Clinician: Referring Physician: Ramonita Lab Treating Physician/Extender: Frann Rider in Treatment: 2 Active Problems ICD-10 Encounter Code Description Active Date Diagnosis I89.0 Lymphedema, not elsewhere classified 01/20/2016 Yes I73.9 Peripheral vascular disease, unspecified 01/20/2016 Yes I83.212  Varicose veins of right lower extremity with both ulcer of 01/20/2016 Yes calf and inflammation I70.232 Atherosclerosis of native arteries of right leg with 01/27/2016 Yes ulceration of calf Inactive Problems Resolved Problems Electronic Signature(s) Signed: 01/27/2016 3:25:24 PM By: Christin Fudge MD, FACS Entered By: Christin Fudge on 01/27/2016 15:25:23 Andrew [name] ([id number]) -------------------------------------------------------------------------------- Progress Note Details Patient Name: Andrew [name] Date of Service: 01/27/2016 3:00 PM Medical Record Number: [id number] Patient Account Number: [id number] Date of Birth/Sex: 06-28-1923 (82 y.o. Male) Treating RN: Baruch Gouty, RN, BSN, [place] Primary Care Physician: Ramonita Lab Other Clinician: Referring Physician: Ramonita Lab Treating Physician/Extender: Frann Rider in Treatment: 2 Subjective Chief Complaint Information obtained from Patient Patient presents for treatment of an open ulcer due to venous insufficiency which is  had on and off on the right lower extremity for several months History of Present Illness (HPI) The following HPI elements were documented for the patient's wound: Location: right lower extremity ulceration Quality: Patient reports experiencing a dull pain to affected area(s). Severity: Patient states wound are getting worse. Duration: Patient has had the wound for > 3 months prior to seeking treatment at the wound center Timing: Pain in wound is Intermittent (comes and goes Context: The wound appeared gradually over time Modifying Factors: Other treatment(s) tried include:local care with hydrogen peroxide and alcohol Associated Signs and Symptoms: Patient reports having increase swelling. 82 year old gentleman who was seen by his PCP Dr. Kerrin Mo for a ulceration on the right calf which she's had for several months. The patient also has past medical history significant for hypertension,  COPD, chronic kidney disease stage III,anemia, bilateral leg edema, status post appendectomy and prostate surgery. he is a former smoker and has not smoked since 1994 he had a venous duplex study done of the right lower extremity in 2012 which was negative for a deep vein thrombosis. he has not had an arterial study done at anytime as per the medical records 01/27/2016 -- he recently had workup done at [facility] and his lower extremity venous duplex for reflux showed chronic deep vein thrombosis in the left femoral and popliteal veins, no evidence of lower extremity superficial thrombophlebitis bilaterally and no incompetence of the greater small saphenous veins bilaterally. Lower extremity arterial study showed resting ABI of 0.62 on the right and 1.0 on the left. They Have recommended a right lower extremity angiogram. Objective Lacount, Dionta D. ([id number]) Constitutional Pulse regular. Respirations normal and unlabored. Afebrile. Vitals Time Taken: 3:03 PM, Height: 64 in, Weight: 195 lbs, BMI: 33.5, Temperature: 97.6 F, Pulse: 90 bpm, Respiratory Rate: 18 breaths/min, Blood Pressure: 166/90 mmHg. Eyes Nonicteric. Reactive to light. Ears, Nose, Mouth, and Throat Lips, teeth, and gums WNL.Marland Kitchen Moist mucosa without lesions. Neck supple and nontender. No palpable supraclavicular or cervical adenopathy. Normal sized without goiter. Respiratory WNL. No retractions.. Breath sounds WNL, No rubs, rales, rhonchi, or wheeze.. Cardiovascular Heart rhythm and rate regular, no murmur or gallop.. Pedal Pulses WNL. No clubbing, cyanosis or edema. Lymphatic No adneopathy. No adenopathy. No adenopathy. Musculoskeletal Adexa without tenderness or enlargement.. Digits and nails w/o clubbing, cyanosis, infection, petechiae, ischemia, or inflammatory conditions.Marland Kitchen Psychiatric Judgement and insight Intact.. No evidence of depression, anxiety, or agitation.. General Notes:  emphysema is much better and he's been wearing his juxta light compression stockings. He was unable to tolerate the 3 layer Profore wrap. Ulcer looks fairly clean and no sharp debridement was required today Integumentary (Hair, Skin) No suspicious lesions. No crepitus or fluctuance. No peri-wound warmth or erythema. No masses.. Wound #1 status is Open. Original cause of wound was Gradually Appeared. The wound is located on the Right,Medial Lower Leg. The wound measures 1.3cm length x 1.3cm width x 0.2cm depth; 1.327cm^2 area and 0.265cm^3 volume. The wound is limited to skin breakdown. There is no tunneling or undermining noted. There is a medium amount of serosanguineous drainage noted. The wound margin is distinct with the outline attached to the wound base. There is medium (34-66%) pink granulation within the wound bed. There is a medium (34-66%) amount of necrotic tissue within the wound bed including Adherent Slough. The periwound skin appearance exhibited: Localized Edema, Moist. The periwound skin appearance did not exhibit: Callus, Crepitus, Excoriation, Fluctuance, Friable, Induration, Rash, Scarring, Dry/Scaly, Maceration, Atrophie Blanche, Cyanosis,  Ecchymosis, Hemosiderin Staining, Mottled, Pallor, Rubor, Erythema. Periwound temperature was noted as No Abnormality. The periwound has tenderness on RAYBON, DEDOMINICIS D. ([id number]) palpation. 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 medihoney to be applied to the wound and apply his juxta light compression stockings 2. Elevation and exercise 3. regular visits the wound center 4. having reviewed his arterial and venous duplex studies I  have recommended that he see the vascular office to schedule his angiogram Plan Wound Cleansing: Wound #1 Right,Medial Lower Leg: Cleanse wound with mild soap and water May Shower, gently pat wound dry prior to applying new dressing. May shower with protection. Primary Wound Dressing: Wound #1 Right,Medial Lower Leg: Medihoney gel Secondary Dressing: Wound #1 Right,Medial Lower Leg: Gauze and Kerlix/Conform Dressing Change Frequency: Wound #1 Right,Medial Lower Leg: Change dressing every day. Follow-up Appointments: Wound #1 Right,Medial Lower Leg: Return Appointment in 1 week. Edema Control: Wound #1 Right,Medial Lower Leg: Patient to wear own Juxtalite/Juzo compression garment. ILA, MATHIEU D. ([id number]) Elevate legs to the level of the heart and pump ankles as often as possible Additional Orders / Instructions: Wound #1 Right,Medial Lower Leg: Increase protein intake. Activity as tolerated We will empirically treat him with: 1. medihoney to be applied to the wound and apply his juxta light compression stockings 2. Elevation and exercise 3. regular visits the wound center 4. having reviewed his arterial and venous duplex studies I have recommended that he see the vascular office to schedule his angiogram Electronic Signature(s) Signed: 01/27/2016 4:14:44 PM By: Christin Fudge MD, FACS Previous Signature: 01/27/2016 4:14:31 PM Version By: Christin Fudge MD, FACS Previous Signature: 01/27/2016 3:28:33 PM Version By: Christin Fudge MD, FACS Entered By: Christin Fudge on 01/27/2016 16:14:44 Andrew [name] ([id number]) -------------------------------------------------------------------------------- SuperBill Details Patient Name: Andrew [name] Date of Service: 01/27/2016 Medical Record Number: [id number] Patient Account Number: [id number] Date of Birth/Sex: June 01, 1923 (82 y.o. 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 I70.232 Atherosclerosis of native arteries of right leg with ulceration of calf Facility Procedures CPT4 Code: [id number] Description: [id number] - WOUND CARE VISIT-LEV 2 EST PT Modifier: Quantity: 1 Physician Procedures CPT4: Description Modifier Quantity Code [id number] - WC PHYS LEVEL 3 - EST PT 1 ICD-10 Description Diagnosis I89.0 Lymphedema, not elsewhere classified I73.9 Peripheral vascular disease, unspecified I83.212 Varicose veins of right lower extremity  with both ulcer of calf and inflammation I70.232 Atherosclerosis of native arteries of right leg with ulceration of calf Electronic Signature(s) Signed: 01/27/2016 4:15:30 PM By: Regan Lemming BSN, RN Signed: 01/27/2016 4:15:38 PM By: Christin Fudge MD, FACS Previous Signature: 01/27/2016 3:28:45 PM Version By: Christin Fudge MD, FACS Entered By: Regan Lemming on 01/27/2016 16:15:30

## 2016-01-31 ENCOUNTER — Ambulatory Visit: Payer: Medicare Other | Admitting: Cardiovascular Disease

## 2016-01-31 ENCOUNTER — Encounter: Payer: Self-pay | Admitting: *Deleted

## 2016-02-03 ENCOUNTER — Other Ambulatory Visit: Payer: Self-pay | Admitting: Vascular Surgery

## 2016-02-03 ENCOUNTER — Encounter: Payer: Medicare Other | Attending: Surgery | Admitting: Surgery

## 2016-02-03 DIAGNOSIS — I129 Hypertensive chronic kidney disease with stage 1 through stage 4 chronic kidney disease, or unspecified chronic kidney disease: Secondary | ICD-10-CM | POA: Insufficient documentation

## 2016-02-03 DIAGNOSIS — I890 Lymphedema, not elsewhere classified: Secondary | ICD-10-CM | POA: Insufficient documentation

## 2016-02-03 DIAGNOSIS — N183 Chronic kidney disease, stage 3 (moderate): Secondary | ICD-10-CM | POA: Insufficient documentation

## 2016-02-03 DIAGNOSIS — J449 Chronic obstructive pulmonary disease, unspecified: Secondary | ICD-10-CM | POA: Diagnosis not present

## 2016-02-03 DIAGNOSIS — D649 Anemia, unspecified: Secondary | ICD-10-CM | POA: Diagnosis not present

## 2016-02-03 DIAGNOSIS — L97211 Non-pressure chronic ulcer of right calf limited to breakdown of skin: Secondary | ICD-10-CM | POA: Diagnosis not present

## 2016-02-03 DIAGNOSIS — Z87891 Personal history of nicotine dependence: Secondary | ICD-10-CM | POA: Insufficient documentation

## 2016-02-03 DIAGNOSIS — I83212 Varicose veins of right lower extremity with both ulcer of calf and inflammation: Secondary | ICD-10-CM | POA: Diagnosis present

## 2016-02-03 DIAGNOSIS — Z86718 Personal history of other venous thrombosis and embolism: Secondary | ICD-10-CM | POA: Diagnosis not present

## 2016-02-03 DIAGNOSIS — I70232 Atherosclerosis of native arteries of right leg with ulceration of calf: Secondary | ICD-10-CM | POA: Diagnosis not present

## 2016-02-03 NOTE — Progress Notes (Addendum)
Andrew [name] ([id number]) Visit Report for 02/03/2016 Chief Complaint Document Details Patient Name: Andrew [name] Date of Service: 02/03/2016 11:00 AM Medical Record Number: [id number] Patient Account Number: [id number] Date of Birth/Sex: March 23, 1923 (82 y.o. Male) Treating RN: Cornell Barman Primary Care Physician: Ramonita Lab Other Clinician: Referring Physician: Ramonita Lab Treating Physician/Extender: Frann Rider in Treatment: 3 Information Obtained from: Patient Chief Complaint Patient presents for treatment of an open ulcer due to venous insufficiency which is had on and off on the right lower extremity for several months Electronic Signature(s) Signed: 02/03/2016 11:14:06 AM By: Christin Fudge MD, FACS Entered By: Christin Fudge on 02/03/2016 11:14:06 Andrew [name] ([id number]) -------------------------------------------------------------------------------- HPI Details Patient Name: Andrew [name] Date of Service: 02/03/2016 11:00 AM Medical Record Number: [id number] Patient Account Number: [id number] Date of Birth/Sex: 04-05-1923 (82 y.o. Male) Treating RN: Cornell Barman Primary Care Physician: Ramonita Lab Other Clinician: Referring Physician: Ramonita Lab Treating Physician/Extender: Frann Rider in Treatment: 3 History of Present Illness Location: right lower extremity ulceration Quality: Patient reports experiencing a dull pain to affected area(s). Severity: Patient states wound are getting worse. Duration: Patient has had the wound for > 3 months prior to seeking treatment at the wound center Timing: Pain in wound is Intermittent (comes and goes Context: The wound appeared gradually over time Modifying Factors: Other treatment(s) tried include:local care with hydrogen peroxide and alcohol Associated Signs and Symptoms: Patient reports having increase swelling. HPI Description: 82 year old gentleman who was seen by his PCP Dr. Kerrin Mo for a  ulceration on the right calf which she's had for several months. The patient also has past medical history significant for hypertension, COPD, chronic kidney disease stage III,anemia, bilateral leg edema, status post appendectomy and prostate surgery. he is a former smoker and has not smoked since 1994 he had a venous duplex study done of the right lower extremity in 2012 which was negative for a deep vein thrombosis. he has not had an arterial study done at anytime as per the medical records 01/27/2016 -- he recently had workup done at [facility] and his lower extremity venous duplex for reflux showed chronic deep vein thrombosis in the left femoral and popliteal veins, no evidence of lower extremity superficial thrombophlebitis bilaterally and no incompetence of the greater small saphenous veins bilaterally. Lower extremity arterial study showed resting ABI of 0.62 on the right and 1.0 on the left. Dr Lucky Cowboy saw him and recommended a right lower extremity angiogram. Electronic Signature(s) Signed: 02/03/2016 11:15:12 AM By: Christin Fudge MD, FACS Entered By: Christin Fudge on 02/03/2016 11:15:12 Andrew [name] ([id number]) -------------------------------------------------------------------------------- Physical Exam Details Patient Name: Andrew [name] Date of Service: 02/03/2016 11:00 AM Medical Record Number: [id number] Patient Account Number: [id number] Date of Birth/Sex: [birth year] (82 y.o. Male) Treating RN: Cornell Barman Primary Care Physician: Ramonita Lab Other Clinician: Referring Physician: Ramonita Lab Treating Physician/Extender: Frann Rider in Treatment: 3 Constitutional . Pulse regular. Respirations normal and unlabored. Afebrile. . Eyes Nonicteric. Reactive to light. Ears, Nose, Mouth, and Throat Lips, teeth, and gums WNL.Marland Kitchen Moist mucosa without lesions. Neck supple and nontender. No palpable supraclavicular or cervical adenopathy. Normal  sized without goiter. Respiratory WNL. No retractions.. Cardiovascular Pedal Pulses WNL. No clubbing, cyanosis or edema. Lymphatic No adneopathy. No adenopathy. No adenopathy. Musculoskeletal Adexa without tenderness or enlargement.. Digits and nails w/o clubbing, cyanosis, infection, petechiae, ischemia, or inflammatory conditions.. Integumentary (Hair, Skin) No suspicious lesions. No crepitus or fluctuance. No peri-wound warmth or erythema. No  masses.Marland Kitchen Psychiatric Judgement and insight Intact.. No evidence of depression, anxiety, or agitation.. Notes the ulcer is looking very good and there is no debris to be removed today. Electronic Signature(s) Signed: 02/03/2016 11:33:25 AM By: Christin Fudge MD, FACS Entered By: Christin Fudge on 02/03/2016 11:33:25 Andrew [name] ([id number]) -------------------------------------------------------------------------------- Physician Orders Details Patient Name: Andrew [name] Date of Service: 02/03/2016 11:00 AM Medical Record Number: [id number] Patient Account Number: [id number] Date of Birth/Sex: September 27, 1922 (82 y.o. 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 o May shower with protection. Primary Wound Dressing Wound #1 Right,Medial Lower  Leg o Medihoney gel Secondary Dressing Wound #1 Right,Medial Lower Leg o Gauze and Kerlix/Conform Dressing Change Frequency Wound #1 Right,Medial Lower Leg o Change dressing every day. Follow-up Appointments Wound #1 Right,Medial Lower Leg o Return Appointment in 1 week. Edema Control Wound #1 Right,Medial Lower Leg o Patient to wear own Juxtalite/Juzo compression garment. o Elevate legs to the level of the heart and pump ankles as often as possible Andrew [name], [name] ([id number]) Additional Orders / Instructions Wound #1 Right,Medial Lower Leg o Increase protein intake. o Activity as tolerated Electronic Signature(s) Signed: 02/03/2016 4:00:53 PM By: Regan Lemming BSN, RN Signed: 02/03/2016 4:34:24 PM By: Christin Fudge MD, FACS Entered By: Regan Lemming on 02/03/2016 11:28:25 Andrew [name] ([id number]) -------------------------------------------------------------------------------- Problem List Details Patient Name: Andrew [name] Date of Service: 02/03/2016 11:00 AM Medical Record Number: [id number] Patient Account Number: [id number] Date of Birth/Sex: [birth year] (82 y.o. 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 veins of right lower extremity with both ulcer of 01/20/2016 Yes calf and inflammation I70.232 Atherosclerosis of native arteries of right leg with 01/27/2016 Yes ulceration of calf Inactive Problems Resolved Problems Electronic Signature(s) Signed: 02/03/2016 11:13:59 AM By: Christin Fudge MD, FACS Entered By: Christin Fudge on 02/03/2016 11:13:59 Andrew [name]  ([id number]) -------------------------------------------------------------------------------- Progress Note Details Patient Name: Andrew [name] Date of Service: 02/03/2016 11:00 AM Medical Record Number: [id number] Patient Account Number: [id number] Date of Birth/Sex: 04-09-1923 (82 y.o. Male) Treating RN: Cornell Barman Primary Care Physician: Ramonita Lab Other Clinician: Referring Physician: Ramonita Lab Treating Physician/Extender: Frann Rider in Treatment: 3 Subjective Chief Complaint Information obtained from Patient Patient presents for treatment of an open ulcer due to venous insufficiency which is had on and off on the right lower extremity for several months History of Present Illness (HPI) The following HPI elements were documented for the patient's wound: Location: right lower extremity ulceration Quality: Patient reports experiencing a dull pain to affected area(s). Severity: Patient states wound are getting worse. Duration: Patient has had the wound for > 3 months prior to seeking treatment at the wound  center Timing: Pain in wound is Intermittent (comes and goes Context: The wound appeared gradually over time Modifying Factors: Other treatment(s) tried include:local care with hydrogen peroxide and alcohol Associated Signs and Symptoms: Patient reports having increase swelling. 82 year old gentleman who was seen by his PCP Dr. Kerrin Mo for a ulceration on the right calf which she's had for several months. The patient also has past medical history significant for hypertension, COPD, chronic kidney disease stage III,anemia, bilateral leg edema, status post appendectomy and prostate surgery. he is a former smoker and has not smoked since 1994 he had a venous duplex study done of the right lower extremity in 2012 which was negative for a deep vein thrombosis. he has not had an arterial study done at anytime as per the medical records 01/27/2016 -- he recently had  workup done at [place] [facility] and his lower extremity venous duplex for reflux showed chronic deep vein thrombosis in the left femoral and popliteal veins, no evidence of lower extremity superficial thrombophlebitis bilaterally and no incompetence of the greater small saphenous veins bilaterally. Lower extremity arterial study showed resting ABI of 0.62 on the right and 1.0 on the left. Dr Lucky Cowboy saw him and recommended a right lower extremity angiogram. Objective Andrew [name], [name] ([id number]) Constitutional Pulse regular. Respirations normal and unlabored. Afebrile. Vitals Time Taken: 11:15 AM, Height: 64 in, Weight: 195 lbs, BMI: 33.5, Temperature: 97.9 F, Pulse: 96 bpm, Respiratory Rate: 20 breaths/min, Blood Pressure: 142/93 mmHg. Eyes Nonicteric. Reactive to light. Ears, Nose, Mouth, and Throat Lips, teeth, and gums WNL.Marland Kitchen Moist mucosa without lesions. Neck supple and nontender. No palpable supraclavicular or cervical adenopathy. Normal sized without goiter. Respiratory WNL. No retractions.. Cardiovascular Pedal Pulses WNL. No clubbing, cyanosis or edema. Lymphatic No adneopathy. No adenopathy. No adenopathy. Musculoskeletal Adexa without tenderness or enlargement.. Digits and nails w/o clubbing, cyanosis, infection, petechiae, ischemia, or inflammatory conditions.Marland Kitchen Psychiatric Judgement and insight Intact.. No evidence of depression, anxiety, or agitation.. General Notes: the ulcer is looking very good and there is no debris to be removed today. Integumentary (Hair, Skin) No suspicious lesions. No crepitus or fluctuance. No peri-wound warmth or erythema. No masses.. Wound #1 status is Open. Original cause of wound was Gradually Appeared. The wound is located on the Right,Medial Lower Leg. The wound measures 1cm length x 1cm width x 0.2cm depth; 0.785cm^2 area and 0.157cm^3 volume. The wound is limited to skin breakdown. There is a medium amount  of serosanguineous drainage noted. The wound margin is distinct with the outline attached to the wound base. There is large (67-100%) pink granulation within the wound bed. There is a small (1-33%) amount of necrotic tissue within the wound bed including Adherent Slough. The periwound skin appearance exhibited: Localized Edema, Moist. The periwound skin appearance did not exhibit: Callus, Crepitus, Excoriation, Fluctuance, Friable, Induration, Rash, Scarring, Dry/Scaly, Maceration, Atrophie Blanche, Cyanosis, Ecchymosis, Hemosiderin Staining, Mottled, Pallor, Rubor, Erythema. Periwound temperature was noted as No Abnormality. The periwound has tenderness on palpation. 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 May shower with protection. Primary Wound Dressing: Wound #1 Right,Medial Lower Leg: Medihoney  gel Secondary Dressing: Wound #1 Right,Medial Lower Leg: Gauze and Kerlix/Conform Dressing Change Frequency: Wound #1 Right,Medial Lower Leg: Change dressing every day. Follow-up Appointments: Wound #1 Right,Medial Lower Leg: Return Appointment in 1 week. Edema Control: Wound #1 Right,Medial Lower Leg: Patient to wear own Juxtalite/Juzo compression garment. Elevate legs to the level of the heart and pump ankles as often as possible Additional Orders / Instructions: Wound #1 Right,Medial Lower Leg: Increase protein intake. Activity as tolerated Andrew [name], [name] ([id number]) We will empirically treat him with: 1. medihoney to be  applied to the wound and apply his juxta light compression stockings 2. Elevation and exercise 3. regular visits the wound center 4. having reviewed his arterial and venous duplex studies I have recommended that he see the vascular office to schedule his angiogram. he has done this and his procedure is planned for this coming Monday Electronic Signature(s) Signed: 02/03/2016 11:34:10 AM By: Christin Fudge MD, FACS Entered By: Christin Fudge on 02/03/2016 11:34:10 Andrew [name] ([id number]) -------------------------------------------------------------------------------- SuperBill Details Patient Name: Andrew [name] Date of Service: 02/03/2016 Medical Record Number: [id number] Patient Account Number: [id number] Date of Birth/Sex: 1923/01/15 (82 y.o. 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 I70.232 Atherosclerosis of native arteries of right leg with ulceration of calf Facility Procedures CPT4 Code: [id number] Description: [id number] - WOUND CARE VISIT-LEV 2 EST PT Modifier: Quantity: 1 Physician Procedures CPT4 Code Description: [id number] - WC PHYS LEVEL 3 - EST PT ICD-10 Description Diagnosis I73.9 Peripheral vascular disease, unspecified I89.0 Lymphedema, not elsewhere classified I70.232 Atherosclerosis of native arteries of right leg with Modifier: ulceration of c Quantity: 1 alf Electronic Signature(s) Signed: 02/03/2016 11:34:43 AM By: Christin Fudge MD, FACS Previous Signature: 02/03/2016 11:34:30 AM Version By: Christin Fudge MD, FACS Entered By: Christin Fudge on 02/03/2016 11:34:43

## 2016-02-03 NOTE — Progress Notes (Addendum)
MIRL, TROCCOLI ([id number]) Visit Report for 02/03/2016 Arrival Information Details Patient Name: Andrew [name], Andrew [name] Date of Service: 02/03/2016 11:00 AM Medical Record Number: [id number] Patient Account Number: [id number] Date of Birth/Sex: 1923/02/26 (82 y.o. Male) Treating RN: Cornell Barman Primary Care Physician: Ramonita Lab Other Clinician: Referring Physician: Ramonita Lab Treating Physician/Extender: Frann Rider in Treatment: 3 Visit Information History Since Last Visit Added or deleted any medications: No Patient Arrived: Andrew [name] Any new allergies or adverse No Arrival Time: 11:13 reactions: Accompanied By: self Had a fall or experienced change in No Transfer Assistance: None activities of daily living that may Patient Identification Verified: Yes affect Secondary Verification Process Yes risk of falls: Completed: Signs or symptoms of abuse/neglect No Patient Requires Transmission- No since last visito Based Precautions: Hospitalized since last visit: No Patient Has Alerts: Yes Has Dressing in Place as Yes Patient Alerts: ABI Non Prescribed: Compressible Pain Present Now: Unable to Respond Electronic Signature(s) Signed: 02/03/2016 3:37:25 PM By: Gretta Cool, RN, BSN, Kim RN, BSN Entered By: Gretta Cool, RN, BSN, Kim on 02/03/2016 11:14:49 Andrew [name] ([id number]) -------------------------------------------------------------------------------- Clinic Level of Care Assessment Details Patient Name: Andrew [name] Date of Service: 02/03/2016 11:00 AM Medical Record Number: [id number] Patient Account Number: [id number] Date of Birth/Sex: [birth year] (82 y.o. Male) Treating RN: Baruch Gouty, RN, BSN, [place] Primary Care Physician: Ramonita Lab Other Clinician: Referring Physician: Ramonita Lab Treating Physician/Extender: Frann Rider in Treatment: 3 Clinic Level of Care Assessment Items TOOL 4 Quantity Score []  - Use when only an EandM is performed on FOLLOW-UP visit  0 ASSESSMENTS - Nursing Assessment / Reassessment X - Reassessment of Co-morbidities (includes updates in patient status) 1 10 X - Reassessment of Adherence to Treatment Plan 1 5 ASSESSMENTS - Wound and Skin Assessment / Reassessment X - Simple Wound Assessment / Reassessment - one wound 1 5 []  - Complex Wound Assessment / Reassessment - multiple wounds 0 []  - Dermatologic / Skin Assessment (not related to wound area) 0 ASSESSMENTS - Focused Assessment []  - Circumferential Edema Measurements - multi extremities 0 []  - Nutritional Assessment / Counseling / Intervention 0 X - Lower Extremity Assessment (monofilament, tuning fork, pulses) 1 5 []  - Peripheral Arterial Disease Assessment (using hand held doppler) 0 ASSESSMENTS - Ostomy and/or Continence Assessment and Care []  - Incontinence Assessment and Management 0 []  - Ostomy Care Assessment and Management (repouching, etc.) 0 PROCESS - Coordination of Care X - Simple Patient / Family Education for ongoing care 1 15 []  - Complex (extensive) Patient / Family Education for ongoing care 0 []  - Staff obtains Programmer, systems, Records, Test Results / Process Orders 0 []  - Staff telephones HHA, Nursing Homes / Clarify orders / etc 0 []  - Routine Transfer to another Facility (non-emergent condition) 0 Andrew [name], Andrew [name]. ([id number]) []  - Routine Hospital Admission (non-emergent condition) 0 []  - New Admissions / Biomedical engineer / Ordering NPWT, Apligraf, etc. 0 []  - Emergency Hospital Admission (emergent condition) 0 []  - Simple Discharge Coordination 0 []  - Complex (extensive) Discharge Coordination 0 PROCESS - Special Needs []  - Pediatric / Minor Patient Management 0 []  - Isolation Patient Management 0 []  - Hearing / Language / Visual special needs 0 []  - Assessment of Community assistance (transportation, D/C planning, etc.) 0 []  - Additional assistance / Altered mentation 0 []  - Support Surface(s) Assessment (bed, cushion, seat, etc.)  0 INTERVENTIONS - Wound Cleansing / Measurement X - Simple Wound Cleansing - one wound 1 5 []  - Complex Wound Cleansing - multiple wounds  0 X - Wound Imaging (photographs - any number of wounds) 1 5 []  - Wound Tracing (instead of photographs) 0 X - Simple Wound Measurement - one wound 1 5 []  - Complex Wound Measurement - multiple wounds 0 INTERVENTIONS - Wound Dressings X - Small Wound Dressing one or multiple wounds 1 10 []  - Medium Wound Dressing one or multiple wounds 0 []  - Large Wound Dressing one or multiple wounds 0 []  - Application of Medications - topical 0 []  - Application of Medications - injection 0 INTERVENTIONS - Miscellaneous []  - External ear exam 0 Andrew [name], [name] ([id number]) []  - Specimen Collection (cultures, biopsies, blood, body fluids, etc.) 0 []  - Specimen(s) / Culture(s) sent or taken to Lab for analysis 0 []  - Patient Transfer (multiple staff / Harrel Lemon Lift / Similar devices) 0 []  - Simple Staple / Suture removal (25 or less) 0 []  - Complex Staple / Suture removal (26 or more) 0 []  - Hypo / Hyperglycemic Management (close monitor of Blood Glucose) 0 []  - Ankle / Brachial Index (ABI) - do not check if billed separately 0 X - Vital Signs 1 5 Has the patient been seen at the hospital within the last three years: Yes Total Score: 70 Level Of Care: New/Established - Level 2 Electronic Signature(s) Signed: 02/03/2016 4:00:53 PM By: Regan Lemming BSN, RN Entered By: Regan Lemming on 02/03/2016 11:29:52 Andrew [name] ([id number]) -------------------------------------------------------------------------------- Encounter Discharge Information Details Patient Name: Andrew [name] Date of Service: 02/03/2016 11:00 AM Medical Record Number: [id number] Patient Account Number: [id number] Date of Birth/Sex: Aug 02, 1922 (82 y.o. Male) Treating RN: Cornell Barman Primary Care Physician: Ramonita Lab Other Clinician: Referring Physician: Ramonita Lab Treating  Physician/Extender: Frann Rider in Treatment: 3 Encounter Discharge Information Items Discharge Pain Level: 0 Discharge Condition: Stable Ambulatory Status: [place] [facility] Discharge Destination: Room Transportation: Private Auto Accompanied By: self Schedule Follow-up Appointment: Yes Medication Reconciliation completed and provided to Patient/Care Yes Lennis Rader: Provided on Clinical Summary of Care: 02/03/2016 Form Type Recipient Paper Patient CB Electronic Signature(s) Signed: 02/03/2016 3:37:25 PM By: Gretta Cool, RN, BSN, Kim RN, BSN Previous Signature: 02/03/2016 11:35:07 AM Version By: Ruthine Dose Entered By: Gretta Cool RN, BSN, Kim on 02/03/2016 11:43:31 Andrew [name] ([id number]) -------------------------------------------------------------------------------- Lower Extremity Assessment Details Patient Name: Andrew [name] Date of Service: 02/03/2016 11:00 AM Medical Record Number: [id number] Patient Account Number: [id number] Date of Birth/Sex: [birth year] (82 y.o. Male) Treating RN: Cornell Barman Primary Care Physician: Ramonita Lab Other Clinician: Referring Physician: Ramonita Lab Treating Physician/Extender: Frann Rider in Treatment: 3 Edema Assessment Assessed: [Left: No] [Right: No] E[Left: dema] [Right: :] Calf Left: Right: Point of Measurement: cm From Medial Instep cm 36.6 cm Ankle Left: Right: Point of Measurement: cm From Medial Instep cm 25 cm Vascular Assessment Pulses: Posterior Tibial Dorsalis Pedis Palpable: [Right:Yes] Extremity colors, hair growth, and conditions: Extremity Color: [Right:Mottled] Hair Growth on Extremity: [Right:Yes] Temperature of Extremity: [Right:Warm] Capillary Refill: [Right:< 3 seconds] Toe Nail Assessment Left: Right: Thick: Yes Discolored: Yes Deformed: No Improper Length and Hygiene: No Electronic Signature(s) Signed: 02/03/2016 3:37:25 PM By: Gretta Cool, RN, BSN, Kim RN, BSN Entered By: Gretta Cool, RN, BSN, Kim on  02/03/2016 11:19:43 Andrew [name] ([id number]) -------------------------------------------------------------------------------- Multi Wound Chart Details Patient Name: Andrew [name] Date of Service: 02/03/2016 11:00 AM Medical Record Number: [id number] Patient Account Number: [id number] Date of Birth/Sex: 07/22/1922 (82 y.o. Male) Treating RN: Baruch Gouty, RN, BSN, [place] Primary Care Physician: Ramonita Lab Other Clinician: Referring Physician: Ramonita Lab Treating Physician/Extender: Christin Fudge  Weeks in Treatment: 3 Vital Signs Height(in): 64 Pulse(bpm): 96 Weight(lbs): 195 Blood Pressure 142/93 (mmHg): Body Mass Index(BMI): 33 Temperature(F): 97.9 Respiratory Rate 20 (breaths/min): Photos: [N/A:N/A] Wound Location: Right Lower Leg - Medial N/A N/A Wounding Event: Gradually Appeared N/A N/A Primary Etiology: Venous Leg Ulcer N/A N/A Comorbid History: Cataracts, Anemia, N/A N/A Chronic Obstructive Pulmonary Disease (COPD), Arrhythmia, Hypertension, Peripheral Venous Disease, Osteoarthritis Date Acquired: 12/14/2015 N/A N/A Weeks of Treatment: 3 N/A N/A Wound Status: Open N/A N/A Measurements L x W x D 1x1x0.2 N/A N/A (cm) Area (cm) : 0.785 N/A N/A Volume (cm) : 0.157 N/A N/A % Reduction in Area: 75.00% N/A N/A % Reduction in Volume: 75.00% N/A N/A Classification: Full Thickness Without N/A N/A Exposed Support Structures Exudate Amount: Medium N/A N/A Andrew [name], Andrew [name] ([id number]) Exudate Type: Serosanguineous N/A N/A Exudate Color: red, brown N/A N/A Wound Margin: Distinct, outline attached N/A N/A Granulation Amount: Large (67-100%) N/A N/A Granulation Quality: Pink N/A N/A Necrotic Amount: Small (1-33%) N/A N/A Exposed Structures: Fascia: No N/A N/A Fat: No Tendon: No Muscle: No Joint: No Bone: No Limited to Skin Breakdown Epithelialization: None N/A N/A Periwound Skin Texture: Edema: Yes N/A N/A Excoriation: No Induration: No Callus:  No Crepitus: No Fluctuance: No Friable: No Rash: No Scarring: No Periwound Skin Moist: Yes N/A N/A Moisture: Maceration: No Dry/Scaly: No Periwound Skin Color: Atrophie Blanche: No N/A N/A Cyanosis: No Ecchymosis: No Erythema: No Hemosiderin Staining: No Mottled: No Pallor: No Rubor: No Temperature: No Abnormality N/A N/A Tenderness on Yes N/A N/A Palpation: Wound Preparation: Ulcer Cleansing: N/A N/A Rinsed/Irrigated with Saline Topical Anesthetic Applied: Other: lidocaine 4% Treatment Notes Electronic Signature(s) Andrew [name], Andrew [name] ([id number]) Signed: 02/03/2016 4:00:53 PM By: Regan Lemming BSN, RN Entered By: Regan Lemming on 02/03/2016 11:27:11 Andrew [name] ([id number]) -------------------------------------------------------------------------------- Multi-Disciplinary Care Plan Details Patient Name: Andrew [name] Date of Service: 02/03/2016 11:00 AM Medical Record Number: [id number] Patient Account Number: [id number] Date of Birth/Sex: 02/02/1923 (82 y.o. Male) Treating RN: Baruch Gouty, RN, BSN, [place] Primary Care Physician: Ramonita Lab Other Clinician: Referring Physician: Ramonita Lab Treating Physician/Extender: Frann Rider in Treatment: 3 Active Inactive Venous Leg Ulcer Nursing Diagnoses: Knowledge deficit related to disease process and management Potential for venous Insuffiency (use before diagnosis confirmed) Goals: Non-invasive venous studies are completed as ordered Date Initiated: 01/13/2016 Goal Status: Active Patient will maintain optimal edema control Date Initiated: 01/13/2016 Goal Status: Active Patient/caregiver will verbalize understanding of disease process and disease management Date Initiated: 01/13/2016 Goal Status: Active Verify adequate tissue perfusion prior to therapeutic compression application Date Initiated: 01/13/2016 Goal Status: Active Interventions: Assess peripheral edema status every visit. Compression as  ordered Provide education on venous insufficiency Notes: Wound/Skin Impairment Nursing Diagnoses: Impaired tissue integrity Knowledge deficit related to ulceration/compromised skin integrity Goals: Patient/caregiver will verbalize understanding of skin care regimen Date Initiated: 01/13/2016 Andrew [name] ([id number]) Goal Status: Active Ulcer/skin breakdown will have a volume reduction of 30% by week 4 Date Initiated: 01/13/2016 Goal Status: Active Ulcer/skin breakdown will have a volume reduction of 50% by week 8 Date Initiated: 01/13/2016 Goal Status: Active Ulcer/skin breakdown will have a volume reduction of 80% by week 12 Date Initiated: 01/13/2016 Goal Status: Active Ulcer/skin breakdown will heal within 14 weeks Date Initiated: 01/13/2016 Goal Status: Active Interventions: Assess patient/caregiver ability to obtain necessary supplies Assess patient/caregiver ability to perform ulcer/skin care regimen upon admission and as needed Assess ulceration(s) every visit Provide education on ulcer and skin care Notes: Electronic Signature(s) Signed: 02/03/2016 4:00:53 PM  By: Regan Lemming BSN, RN Entered By: Regan Lemming on 02/03/2016 11:26:44 Andrew [name] ([id number]) -------------------------------------------------------------------------------- Pain Assessment Details Patient Name: Andrew [name] Date of Service: 02/03/2016 11:00 AM Medical Record Number: [id number] Patient Account Number: [id number] Date of Birth/Sex: 1922-07-18 (82 y.o. Male) Treating RN: Cornell Barman Primary Care Physician: Ramonita Lab Other Clinician: Referring Physician: Ramonita Lab Treating Physician/Extender: Frann Rider in Treatment: 3 Active Problems Location of Pain Severity and Description of Pain Patient Has Paino No Site Locations Pain Management and Medication Current Pain Management: Electronic Signature(s) Signed: 02/03/2016 3:37:25 PM By: Gretta Cool, RN, BSN, Kim RN, BSN Entered  By: Gretta Cool, RN, BSN, Kim on 02/03/2016 11:15:48 Andrew [name] ([id number]) -------------------------------------------------------------------------------- Patient/Caregiver Education Details Patient Name: Andrew [name] Date of Service: 02/03/2016 11:00 AM Medical Record Number: [id number] Patient Account Number: [id number] Date of Birth/Gender: 1923/01/22 (82 y.o. Male) Treating RN: Cornell Barman Primary Care Physician: Ramonita Lab Other Clinician: Referring Physician: Ramonita Lab Treating Physician/Extender: Frann Rider in Treatment: 3 Education Assessment Education Provided To: Patient Education Topics Provided Wound/Skin Impairment: Handouts: Caring for Your Ulcer Methods: Demonstration Responses: Refused information Electronic Signature(s) Signed: 02/03/2016 3:37:25 PM By: Gretta Cool, RN, BSN, Kim RN, BSN Entered By: Gretta Cool, RN, BSN, Kim on 02/03/2016 11:43:44 Andrew [name] ([id number]) -------------------------------------------------------------------------------- Wound Assessment Details Patient Name: Andrew [name] Date of Service: 02/03/2016 11:00 AM Medical Record Number: [id number] Patient Account Number: [id number] Date of Birth/Sex: [birth year] (82 y.o. Male) Treating RN: Cornell Barman Primary Care Physician: Ramonita Lab Other Clinician: Referring Physician: Ramonita Lab Treating Physician/Extender: Frann Rider in Treatment: 3 Wound Status Wound Number: 1 Primary Venous Leg Ulcer Etiology: Wound Location: Right Lower Leg - Medial Wound Open Wounding Event: Gradually Appeared Status: Date Acquired: 12/14/2015 Comorbid Cataracts, Anemia, Chronic Obstructive Weeks Of Treatment: 3 History: Pulmonary Disease (COPD), Clustered Wound: No Arrhythmia, Hypertension, Peripheral Venous Disease, Osteoarthritis Photos Wound Measurements Length: (cm) 1 Width: (cm) 1 Depth: (cm) 0.2 Area: (cm) 0.785 Volume: (cm) 0.157 % Reduction in Area: 75% %  Reduction in Volume: 75% Epithelialization: None Wound Description Full Thickness Without Exposed Classification: Support Structures Wound Margin: Distinct, outline attached Exudate Medium Amount: Exudate Type: Serosanguineous Exudate Color: red, brown Foul Odor After Cleansing: No Wound Bed Granulation Amount: Large (67-100%) Exposed Structure Granulation Quality: Pink Fascia Exposed: No Andrew [name], [name] ([id number]) Necrotic Amount: Small (1-33%) Fat Layer Exposed: No Necrotic Quality: Adherent Slough Tendon Exposed: No Muscle Exposed: No Joint Exposed: No Bone Exposed: No Limited to Skin Breakdown Periwound Skin Texture Texture Color No Abnormalities Noted: No No Abnormalities Noted: No Callus: No Atrophie Blanche: No Crepitus: No Cyanosis: No Excoriation: No Ecchymosis: No Fluctuance: No Erythema: No Friable: No Hemosiderin Staining: No Induration: No Mottled: No Localized Edema: Yes Pallor: No Rash: No Rubor: No Scarring: No Temperature / Pain Moisture Temperature: No Abnormality No Abnormalities Noted: No Tenderness on Palpation: Yes Dry / Scaly: No Maceration: No Moist: Yes Wound Preparation Ulcer Cleansing: Rinsed/Irrigated with Saline Topical Anesthetic Applied: Other: lidocaine 4%, Treatment Notes Wound #1 (Right, Medial Lower Leg) 1. Cleansed with: Clean wound with Normal Saline 2. Anesthetic Topical Lidocaine 4% cream to wound bed prior to debridement 4. Dressing Applied: Medihoney Gel 5. Secondary Dressing Applied Non-Adherent pad 7. Secured with Recruitment consultant) Signed: 02/03/2016 3:37:25 PM By: Gretta Cool, RN, BSN, Kim RN, BSN Entered By: Gretta Cool, RN, BSN, Kim on 02/03/2016 11:20:50 Andrew [name], Andrew [name] ([id number]) Andrew [name], Andrew [name] ([id number]) -------------------------------------------------------------------------------- Vitals Details Patient Name: Andrew [name] Date of Service: 02/03/2016 11:00 AM  Medical Record  Number: [id number] Patient Account Number: [id number] Date of Birth/Sex: 1922/08/14 (82 y.o. Male) Treating RN: Cornell Barman Primary Care Physician: Ramonita Lab Other Clinician: Referring Physician: Ramonita Lab Treating Physician/Extender: Frann Rider in Treatment: 3 Vital Signs Time Taken: 11:15 Temperature (F): 97.9 Height (in): 64 Pulse (bpm): 96 Weight (lbs): 195 Respiratory Rate (breaths/min): 20 Body Mass Index (BMI): 33.5 Blood Pressure (mmHg): 142/93 Reference Range: 80 - 120 mg / dl Electronic Signature(s) Signed: 02/03/2016 3:37:25 PM By: Gretta Cool, RN, BSN, Kim RN, BSN Entered By: Gretta Cool, RN, BSN, Kim on 02/03/2016 11:16:26

## 2016-02-04 ENCOUNTER — Other Ambulatory Visit
Admission: RE | Admit: 2016-02-04 | Discharge: 2016-02-04 | Disposition: A | Discharge status: Home / Self Care | Payer: Medicare Other | Source: Ambulatory Visit | Attending: Vascular Surgery | Admitting: Vascular Surgery

## 2016-02-04 DIAGNOSIS — Z0000 Encounter for general adult medical examination without abnormal findings: Secondary | ICD-10-CM | POA: Insufficient documentation

## 2016-02-04 LAB — BUN: BUN: 35 mg/dL — AB (ref 6–20)

## 2016-02-04 LAB — CREATININE, SERUM
CREATININE: 1.51 mg/dL — AB (ref 0.61–1.24)
GFR calc non Af Amer: 39 mL/min — ABNORMAL LOW (ref >60)
GFR, EST AFRICAN AMERICAN: 45 mL/min — AB (ref >60)

## 2016-02-07 ENCOUNTER — Encounter: Payer: Self-pay | Admitting: *Deleted

## 2016-02-07 ENCOUNTER — Encounter
Admission: RE | Disposition: A | Discharge status: Home / Self Care | Payer: Self-pay | Source: Ambulatory Visit | Attending: Vascular Surgery

## 2016-02-07 ENCOUNTER — Ambulatory Visit
Admission: RE | Admit: 2016-02-07 | Discharge: 2016-02-07 | Disposition: A | Discharge status: Home / Self Care | Payer: Medicare Other | Source: Ambulatory Visit | Attending: Vascular Surgery | Admitting: Vascular Surgery

## 2016-02-07 DIAGNOSIS — Z7951 Long term (current) use of inhaled steroids: Secondary | ICD-10-CM | POA: Diagnosis not present

## 2016-02-07 DIAGNOSIS — Z87891 Personal history of nicotine dependence: Secondary | ICD-10-CM | POA: Insufficient documentation

## 2016-02-07 DIAGNOSIS — I129 Hypertensive chronic kidney disease with stage 1 through stage 4 chronic kidney disease, or unspecified chronic kidney disease: Secondary | ICD-10-CM | POA: Insufficient documentation

## 2016-02-07 DIAGNOSIS — M7989 Other specified soft tissue disorders: Secondary | ICD-10-CM | POA: Insufficient documentation

## 2016-02-07 DIAGNOSIS — L97311 Non-pressure chronic ulcer of right ankle limited to breakdown of skin: Secondary | ICD-10-CM | POA: Diagnosis not present

## 2016-02-07 DIAGNOSIS — I739 Peripheral vascular disease, unspecified: Secondary | ICD-10-CM | POA: Diagnosis not present

## 2016-02-07 DIAGNOSIS — N189 Chronic kidney disease, unspecified: Secondary | ICD-10-CM | POA: Insufficient documentation

## 2016-02-07 DIAGNOSIS — I70238 Atherosclerosis of native arteries of right leg with ulceration of other part of lower right leg: Secondary | ICD-10-CM | POA: Diagnosis not present

## 2016-02-07 HISTORY — DX: Peripheral vascular disease, unspecified: I73.9

## 2016-02-07 HISTORY — DX: Essential (primary) hypertension: I10

## 2016-02-07 HISTORY — DX: Chronic kidney disease, unspecified: N18.9

## 2016-02-07 HISTORY — PX: PERIPHERAL VASCULAR CATHETERIZATION: SHX172C

## 2016-02-07 SURGERY — LOWER EXTREMITY ANGIOGRAPHY
Anesthesia: Moderate Sedation | Laterality: Right

## 2016-02-07 MED ORDER — CLOPIDOGREL BISULFATE 75 MG PO TABS: 75.0000 mg | ORAL_TABLET | Freq: Every day | ORAL | 11 refills | Status: DC

## 2016-02-07 MED ORDER — DEXTROSE 5 % IV SOLN
1.5000 g | INTRAVENOUS | Status: AC
  Administered 2016-02-07: 1.5 g via INTRAVENOUS

## 2016-02-07 MED ORDER — FENTANYL CITRATE (PF) 100 MCG/2ML IJ SOLN
INTRAMUSCULAR | Status: DC | PRN
  Administered 2016-02-07 (×2): 25 ug
  Administered 2016-02-07: 50 ug
  Administered 2016-02-07 (×4): 25 ug

## 2016-02-07 MED ORDER — SODIUM CHLORIDE 0.9 % IV SOLN: 500.0000 mL | Freq: Once | INTRAVENOUS | Status: DC | PRN

## 2016-02-07 MED ORDER — MIDAZOLAM HCL 2 MG/2ML IJ SOLN
INTRAMUSCULAR | Status: DC | PRN
  Administered 2016-02-07 (×6): 1 mg

## 2016-02-07 MED ORDER — FENTANYL CITRATE (PF) 100 MCG/2ML IJ SOLN
INTRAMUSCULAR | Status: AC
  Filled 2016-02-07: qty 2

## 2016-02-07 MED ORDER — IOPAMIDOL (ISOVUE-300) INJECTION 61%
INTRAVENOUS | Status: DC | PRN
  Administered 2016-02-07: 97 mL via INTRA_ARTERIAL

## 2016-02-07 MED ORDER — HEPARIN SODIUM (PORCINE) 1000 UNIT/ML IJ SOLN
INTRAMUSCULAR | Status: AC
  Filled 2016-02-07: qty 1

## 2016-02-07 MED ORDER — LIDOCAINE-EPINEPHRINE (PF) 1 %-1:200000 IJ SOLN
INTRAMUSCULAR | Status: AC
  Filled 2016-02-07: qty 30

## 2016-02-07 MED ORDER — ONDANSETRON HCL 4 MG/2ML IJ SOLN: 4.0000 mg | Freq: Four times a day (QID) | INTRAMUSCULAR | Status: DC | PRN

## 2016-02-07 MED ORDER — SODIUM CHLORIDE 0.9 % IV SOLN: INTRAVENOUS | Status: DC

## 2016-02-07 MED ORDER — CLOPIDOGREL BISULFATE 75 MG PO TABS
150.0000 mg | ORAL_TABLET | Freq: Once | ORAL | Status: AC
  Administered 2016-02-07: 150 mg via ORAL

## 2016-02-07 MED ORDER — DIPHENHYDRAMINE HCL 50 MG/ML IJ SOLN
INTRAMUSCULAR | Status: AC
  Filled 2016-02-07: qty 1

## 2016-02-07 MED ORDER — HYDRALAZINE HCL 20 MG/ML IJ SOLN: 5.0000 mg | INTRAMUSCULAR | Status: DC | PRN

## 2016-02-07 MED ORDER — ATORVASTATIN CALCIUM 20 MG PO TABS: 20.0000 mg | ORAL_TABLET | Freq: Every day | ORAL | 3 refills | Status: DC

## 2016-02-07 MED ORDER — MIDAZOLAM HCL 2 MG/2ML IJ SOLN
INTRAMUSCULAR | Status: AC
  Filled 2016-02-07: qty 2

## 2016-02-07 MED ORDER — METOPROLOL TARTRATE 5 MG/5ML IV SOLN: 2.0000 mg | INTRAVENOUS | Status: DC | PRN

## 2016-02-07 MED ORDER — HEPARIN (PORCINE) IN NACL 2-0.9 UNIT/ML-% IJ SOLN
INTRAMUSCULAR | Status: AC
  Filled 2016-02-07: qty 1000

## 2016-02-07 MED ORDER — METHYLPREDNISOLONE SODIUM SUCC 125 MG IJ SOLR: 125.0000 mg | INTRAMUSCULAR | Status: DC | PRN

## 2016-02-07 MED ORDER — GUAIFENESIN-DM 100-10 MG/5ML PO SYRP: 15.0000 mL | ORAL_SOLUTION | ORAL | Status: DC | PRN

## 2016-02-07 MED ORDER — DIPHENHYDRAMINE HCL 50 MG/ML IJ SOLN
INTRAMUSCULAR | Status: DC | PRN
  Administered 2016-02-07: 25 mg via INTRAVENOUS

## 2016-02-07 MED ORDER — ACETAMINOPHEN 325 MG RE SUPP
325.0000 mg | RECTAL | Status: DC | PRN
  Filled 2016-02-07: qty 2

## 2016-02-07 MED ORDER — TRAMADOL HCL 50 MG PO TABS: 50.0000 mg | ORAL_TABLET | Freq: Four times a day (QID) | ORAL | 0 refills | Status: DC | PRN

## 2016-02-07 MED ORDER — MIDAZOLAM HCL 5 MG/5ML IJ SOLN
INTRAMUSCULAR | Status: AC
Start: 2016-02-07 — End: 2016-02-07
  Filled 2016-02-07: qty 5

## 2016-02-07 MED ORDER — HYDROMORPHONE HCL 1 MG/ML IJ SOLN
INTRAMUSCULAR | Status: AC
  Administered 2016-02-07: 0.5 mg via INTRAVENOUS
  Filled 2016-02-07: qty 1

## 2016-02-07 MED ORDER — HYDROMORPHONE HCL 1 MG/ML IJ SOLN
0.5000 mg | INTRAMUSCULAR | Status: DC | PRN
  Administered 2016-02-07: 0.5 mg via INTRAVENOUS

## 2016-02-07 MED ORDER — HEPARIN SODIUM (PORCINE) 1000 UNIT/ML IJ SOLN
INTRAMUSCULAR | Status: DC | PRN
  Administered 2016-02-07: 4000 [IU] via INTRAVENOUS

## 2016-02-07 MED ORDER — CLOPIDOGREL BISULFATE 75 MG PO TABS
ORAL_TABLET | ORAL | Status: AC
  Administered 2016-02-07: 150 mg via ORAL
  Filled 2016-02-07: qty 2

## 2016-02-07 MED ORDER — OXYCODONE-ACETAMINOPHEN 5-325 MG PO TABS: 1.0000 | ORAL_TABLET | ORAL | Status: DC | PRN

## 2016-02-07 MED ORDER — HYDROMORPHONE HCL 1 MG/ML IJ SOLN: 1.0000 mg | Freq: Once | INTRAMUSCULAR | Status: DC

## 2016-02-07 MED ORDER — LABETALOL HCL 5 MG/ML IV SOLN: 10.0000 mg | INTRAVENOUS | Status: DC | PRN

## 2016-02-07 MED ORDER — ASPIRIN EC 81 MG PO TBEC: 81.0000 mg | DELAYED_RELEASE_TABLET | Freq: Every day | ORAL | 2 refills | Status: DC

## 2016-02-07 MED ORDER — FAMOTIDINE 20 MG PO TABS: 40.0000 mg | ORAL_TABLET | ORAL | Status: DC | PRN

## 2016-02-07 MED ORDER — PHENOL 1.4 % MT LIQD: 1.0000 | OROMUCOSAL | Status: DC | PRN

## 2016-02-07 MED ORDER — ACETAMINOPHEN 325 MG PO TABS: 325.0000 mg | ORAL_TABLET | ORAL | Status: DC | PRN

## 2016-02-07 SURGICAL SUPPLY — 22 items
BALLN LUTONIX 5X150X130 (BALLOONS) ×3
BALLN ULTRVRSE 3X220X150 (BALLOONS) ×3
BALLOON LUTONIX 5X150X130 (BALLOONS) ×1 IMPLANT
BALLOON ULTRVRSE 3X220X150 (BALLOONS) ×1 IMPLANT
CATH CXI 4F 90 DAV (CATHETERS) ×3 IMPLANT
CATH CXI SUPP ANG 4FR 135 (MICROCATHETER) ×1 IMPLANT
CATH CXI SUPP ANG 4FR 135CM (MICROCATHETER) ×3
CATH PIG 70CM (CATHETERS) ×3 IMPLANT
CATH RIM 65CM (CATHETERS) ×3 IMPLANT
CATH VERT 100CM (CATHETERS) ×3 IMPLANT
DEVICE PRESTO INFLATION (MISCELLANEOUS) ×3 IMPLANT
DEVICE STARCLOSE SE CLOSURE (Vascular Products) ×3 IMPLANT
GLIDEWIRE ADV .035X260CM (WIRE) ×3 IMPLANT
GUIDEWIRE PFTE-COATED .018X300 (WIRE) ×3 IMPLANT
PACK ANGIOGRAPHY (CUSTOM PROCEDURE TRAY) ×3 IMPLANT
SHEATH ANL2 6FRX45 HC (SHEATH) ×3 IMPLANT
SHEATH BRITE TIP 5FRX11 (SHEATH) ×3 IMPLANT
STENT VIABAHN 6X250X120 (Permanent Stent) ×3 IMPLANT
SYR MEDRAD MARK V 150ML (SYRINGE) ×3 IMPLANT
TUBING CONTRAST HIGH PRESS 72 (TUBING) ×3 IMPLANT
WIRE G V18X300CM (WIRE) ×3 IMPLANT
WIRE J 3MM .035X145CM (WIRE) ×3 IMPLANT

## 2016-02-07 NOTE — H&P (Signed)
  [facility] History & Physical Update  The patient was interviewed and re-examined.  The patient's previous History and Physical has been reviewed and is unchanged.  There is no change in the plan of care. We plan to proceed with the scheduled procedure.  DEW,JASON, MD  02/07/2016, 9:28 AM

## 2016-02-07 NOTE — Op Note (Signed)
[name] Percutaneous Study/Intervention Procedural Note   Date of Surgery: 02/07/2016  Surgeon(s):DEW,JASON   Assistants:none  Pre-operative Diagnosis: PAD with ulceration right lower extremity  Post-operative diagnosis: Same  Procedure(s) Performed: 1. Ultrasound guidance for vascular access left femoral artery 2. Catheter placement into right peroneal artery from left femoral approach 3. Aortogram and selective right lower extremity angiogram 4. Percutaneous transluminal angioplasty of tibioperoneal trunk and proximal peroneal artery with 3 mm diameter by 30 cm length angioplasty balloon 5. Percutaneous transluminal angioplasty of the right distal SFA and popliteal artery with 5 mm diameter by 15 cm length Lutonix drug-coated angioplasty balloon used twice  6.  Viabahn stent placement to the right distal SFA and popliteal artery for greater than 50% stenosis and dissection after angioplasty using a 6 mm diameter by 25 cm length stent 7. StarClose closure device left femoral artery  EBL: 20 cc  Contrast: 97 cc  Fluoro Time: 16.3 minutes  Moderate Conscious Sedation Time: approximately 50 minutes using 6 mg of Versed and 200 mcg of Fentanyl  Indications: Patient is a 82 y.o.male with a nonhealing ulceration on the right leg. The patient has noninvasive study showing a markedly reduced right ABI 0.6 range with a normal left ABI of 1. The patient is brought in for angiography for further evaluation and potential treatment. Risks and benefits are discussed and informed consent is obtained  Procedure: The patient was identified and appropriate procedural time out was performed. The patient was then placed supine on the table and prepped and draped in the usual sterile fashion.Moderate conscious sedation was administered during a face to face encounter with the  patient throughout the procedure with my supervision of the RN administering medicines and monitoring the patient's vital signs, pulse oximetry, telemetry and mental status throughout from the start of the procedure until the patient was taken to the recovery room. Ultrasound was used to evaluate the left common femoral artery. It was patent . A digital ultrasound image was acquired. A Seldinger needle was used to access the left common femoral artery under direct ultrasound guidance and a permanent image was performed. A 0.035 J wire was advanced without resistance and a 5Fr sheath was placed. Pigtail catheter was placed into the aorta and an AP aortogram was performed. This demonstrated normal aorta with what appeared to be a moderate left renal artery stenosis and the right renal artery not particularly well visualized, normal iliac arteries with tortuosity but no stenosis. I then crossed the aortic bifurcation and advanced to the right femoral head. Selective right lower extremity angiogram was then performed. This demonstrated normal common femoral artery and profunda femoris artery. The proximal SFA was widely patent but in the distal SFA there was an abrupt occlusion with reconstitution of the popliteal artery just above the knee. There was then a moderately diseased tibioperoneal trunk and proximal peroneal artery with what appeared to be 50-60% stenosis. The posterior tibial artery reconstituted in the mid to distal calf. The patient was systemically heparinized and a 6 [place] Ansell sheath was then placed over the [employer] wire. I then used a Kumpe catheter and the advantage wire to navigate through the occlusion in the SFA and popliteal artery with a moderate amount of difficulty. Significant subintimal planes were created in the popliteal artery which were difficult to reenter we eventually reentered the tibioperoneal trunk and confirm intraluminal flow in the popliteal artery after  exchanging for a CXI catheter. I then placed a 0.018 advantage wire  and proceeded with treatment. The tibioperoneal trunk and peroneal arteries looked more irregular and so these were treated initially with a 3 mm diameter by 30 cm length angioplasty balloon that was taken up into the popliteal artery. This was inflated to 12 atm. The SFA and popliteal artery occlusion was then addressed with a 5 mm diameter by 15 cm length Lutonix drug-coated angioplasty balloon. 2 inflations were performed and both were to about 10 atm for 1 minute. Completion angiogram showed the tibioperoneal trunk and peroneal artery be patent with less than 20% residual stenosis but the popliteal artery up to the distal SFA had multiple areas of greater than 50% stenosis after angioplasty. I elected to cover this area with a stent. A 6 mm diameter by 25 cm length Viabahn stent was deployed from the knee up to the distal superficial femoral artery and was postdilated with a 5 mm balloon with an excellent angiographic completion result and less than 10% residual stenosis. I elected to terminate the procedure. The sheath was removed and StarClose closure device was deployed in the left femoral artery with excellent hemostatic result. The patient was taken to the recovery room in stable condition having tolerated the procedure well.  Findings:  Aortogram: Normal aorta with what appeared to be a moderate left renal artery stenosis and the right renal artery not particularly well visualized, normal iliac arteries with tortuosity but no stenosis. Right Lower Extremity: This demonstrated normal common femoral artery and profunda femoris artery. The proximal SFA was widely patent but in the distal SFA there was an abrupt occlusion with reconstitution of the popliteal artery just above the knee. There was then a moderately diseased tibioperoneal trunk and proximal peroneal artery with what appeared to be 50-60% stenosis.  The posterior tibial artery reconstituted in the mid to distal calf.   Disposition: Patient was taken to the recovery room in stable condition having tolerated the procedure well.  Complications: None  DEW,JASON 02/07/2016 12:19 PM

## 2016-02-10 ENCOUNTER — Encounter: Payer: Medicare Other | Admitting: Surgery

## 2016-02-10 DIAGNOSIS — I83212 Varicose veins of right lower extremity with both ulcer of calf and inflammation: Secondary | ICD-10-CM | POA: Diagnosis not present

## 2016-02-10 NOTE — Progress Notes (Addendum)
[name], [name] ([id number]) Visit Report for 02/10/2016 Chief Complaint Document Details Patient Name: [name] Date of Service: 02/10/2016 12:45 PM Medical Record Number: [id number] Patient Account Number: [id number] Date of Birth/Sex: Aug 23, 1922 (82 y.o. Male) Treating RN: Cornell Barman Primary Care Physician: Ramonita Lab Other Clinician: Referring Physician: Ramonita Lab Treating Physician/Extender: Frann Rider in Treatment: 4 Information Obtained from: Patient Chief Complaint Patient presents for treatment of an open ulcer due to venous insufficiency which is had on and off on the right lower extremity for several months Electronic Signature(s) Signed: 02/10/2016 1:31:08 PM By: Christin Fudge MD, FACS Entered By: Christin Fudge on 02/10/2016 13:31:07 Andrew [name] ([id number]) -------------------------------------------------------------------------------- HPI Details Patient Name: Andrew [name] Date of Service: 02/10/2016 12:45 PM Medical Record Number: [id number] Patient Account Number: [id number] Date of Birth/Sex: Dec 24, 1922 (82 y.o. Male) Treating RN: Cornell Barman Primary Care Physician: Ramonita Lab Other Clinician: Referring Physician: Ramonita Lab Treating Physician/Extender: Frann Rider in Treatment: 4 History of Present Illness Location: right lower extremity ulceration Quality: Patient reports experiencing a dull pain to affected area(s). Severity: Patient states wound are getting worse. Duration: Patient has had the wound for > 3 months prior to seeking treatment at the wound center Timing: Pain in wound is Intermittent (comes and goes Context: The wound appeared gradually over time Modifying Factors: Other treatment(s) tried include:local care with hydrogen peroxide and alcohol Associated Signs and Symptoms: Patient reports having increase swelling. HPI Description: 82 year old gentleman who was seen by his PCP Dr. Kerrin Mo for a  ulceration on the right calf which she's had for several months. The patient also has past medical history significant for hypertension, COPD, chronic kidney disease stage III,anemia, bilateral leg edema, status post appendectomy and prostate surgery. he is a former smoker and has not smoked since 1994 he had a venous duplex study done of the right lower extremity in 2012 which was negative for a deep vein thrombosis. he has not had an arterial study done at anytime as per the medical records 01/27/2016 -- he recently had workup done at [facility] and his lower extremity venous duplex for reflux showed chronic deep vein thrombosis in the left femoral and popliteal veins, no evidence of lower extremity superficial thrombophlebitis bilaterally and no incompetence of the greater small saphenous veins bilaterally. Lower extremity arterial study showed resting ABI of 0.62 on the right and 1.0 on the left. Dr Lucky Cowboy saw him and recommended a right lower extremity angiogram. 02/10/2016 -- he had his procedure done on 02/07/2016 for a peripheral arterial disease of the right lower extremity and hand percutaneous transluminal angioplasty of the tibioperoneal trunk and proximal peroneal artery, right distal SFA and popliteal arteries and also had a stent placed in the right distal SFA and popliteal artery. Electronic Signature(s) Signed: 02/10/2016 1:31:18 PM By: Christin Fudge MD, FACS Previous Signature: 02/10/2016 1:24:36 PM Version By: Christin Fudge MD, FACS Entered By: Christin Fudge on 02/10/2016 13:31:17 [name] ([id number]) -------------------------------------------------------------------------------- Physical Exam Details Patient Name: Andrew [name] Date of Service: 02/10/2016 12:45 PM Medical Record Number: [id number] Patient Account Number: [id number] Date of Birth/Sex: 1922-12-02 (82 y.o. Male) Treating RN: Cornell Barman Primary Care Physician: Ramonita Lab Other Clinician: Referring Physician: Ramonita Lab Treating Physician/Extender: Frann Rider in Treatment: 4 Constitutional . Pulse regular. Respirations normal and unlabored. Afebrile. . Eyes Nonicteric. Reactive to light. Ears, Nose, Mouth, and Throat Lips, teeth, and gums WNL.Marland Kitchen Moist mucosa without lesions. Neck supple and nontender. No  palpable supraclavicular or cervical adenopathy. Normal sized without goiter. Respiratory WNL. No retractions.. Cardiovascular Pedal Pulses WNL. No clubbing, cyanosis or edema. Chest Breasts symmetical and no nipple discharge.. Breast tissue WNL, no masses, lumps, or tenderness.. Lymphatic No adneopathy. No adenopathy. No adenopathy. Musculoskeletal Adexa without tenderness or enlargement.. Digits and nails w/o clubbing, cyanosis, infection, petechiae, ischemia, or inflammatory conditions.. Integumentary (Hair, Skin) No suspicious lesions. No crepitus or fluctuance. No peri-wound warmth or erythema. No masses.Marland Kitchen Psychiatric Judgement and insight Intact.. No evidence of depression, anxiety, or agitation.. Notes there is some superficial debris which I washed out with moist saline gauze and the base of the ulcer looks clean Electronic Signature(s) Signed: 02/10/2016 1:32:11 PM By: Christin Fudge MD, FACS Entered By: Christin Fudge on 02/10/2016 13:32:10 [name] ([id number]) -------------------------------------------------------------------------------- Physician Orders Details Patient Name: Andrew [name] Date of Service: 02/10/2016 12:45 PM Medical Record Number: [id number] Patient Account Number: [id number] Date of Birth/Sex: 23-May-1923 (82 y.o. Male) Treating RN: Cornell Barman Primary Care Physician: Ramonita Lab Other Clinician: Referring Physician: Ramonita Lab Treating Physician/Extender: Frann Rider in Treatment: 4 Verbal / Phone Orders: Yes Clinician: Cornell Barman Read Back and Verified: Yes Diagnosis  Coding Wound Cleansing Wound #1 Right,Medial Lower Leg o Cleanse wound with mild soap and water o May Shower, gently pat wound dry prior to applying new dressing. o May shower with protection. Anesthetic Wound #1 Right,Medial Lower Leg o Topical Lidocaine 4% cream applied to wound bed prior to debridement Primary Wound Dressing Wound #1 Right,Medial Lower Leg o Medihoney gel Secondary Dressing Wound #1 Right,Medial Lower Leg o Boardered Foam Dressing Dressing Change Frequency Wound #1 Right,Medial Lower Leg o Change dressing every day. Follow-up Appointments Wound #1 Right,Medial Lower Leg o Return Appointment in 1 week. Edema Control Wound #1 Right,Medial Lower Leg o Patient to wear own Juxtalite/Juzo compression garment. o Elevate legs to the level of the heart and pump ankles as often as possible Additional Orders / Instructions Wound #1 Right,Medial Lower Leg o Increase protein intake. o Activity as tolerated Andrew [name], Andrew [name] ([id number]) Electronic Signature(s) Signed: 02/10/2016 4:56:45 PM By: Christin Fudge MD, FACS Signed: 02/10/2016 5:15:55 PM By: Gretta Cool RN, BSN, Kim RN, BSN Entered By: Gretta Cool, RN, BSN, Kim on 02/10/2016 13:29:07 Andrew [name] ([id number]) -------------------------------------------------------------------------------- Problem List Details Patient Name: Andrew [name] Date of Service: 02/10/2016 12:45 PM Medical Record Number: [id number] Patient Account Number: [id number] Date of Birth/Sex: [birth year] (82 y.o. 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 of 01/20/2016 Yes calf and inflammation I70.232 Atherosclerosis of native arteries of right leg with 01/27/2016 Yes ulceration of calf Inactive Problems Resolved Problems Electronic Signature(s) Signed: 02/10/2016 1:30:57 PM By: Christin Fudge MD, FACS Entered By: Christin Fudge on 02/10/2016 13:30:56 Andrew [name] ([id number]) -------------------------------------------------------------------------------- Progress Note Details Patient Name: Andrew [name] Date of Service: 02/10/2016 12:45 PM Medical Record Number: [id number] Patient Account Number: [id number] Date of Birth/Sex: 05-20-1923 (82 y.o. Male) Treating RN: Cornell Barman Primary Care Physician: Ramonita Lab Other Clinician: Referring Physician: Ramonita Lab Treating Physician/Extender: Frann Rider in Treatment: 4 Subjective Chief Complaint Information obtained from Patient Patient presents for treatment of an open ulcer due to venous insufficiency which is had on and off  on the right lower extremity for several months History of Present Illness (HPI) The following HPI elements were documented for the patient's wound: Location: right lower extremity ulceration Quality: Patient reports experiencing a dull pain to affected area(s). Severity: Patient states wound are getting worse. Duration: Patient has had the wound for > 3 months prior to seeking treatment at the wound center Timing: Pain in wound is Intermittent (comes and goes Context: The wound appeared gradually over time Modifying Factors: Other treatment(s) tried include:local care with hydrogen peroxide and alcohol Associated Signs and Symptoms: Patient reports having increase swelling. 82 year old gentleman who was seen by his PCP Dr. Kerrin Mo for a ulceration on the right calf which she's had for several months. The patient also has past medical history significant for hypertension, COPD, chronic kidney disease stage III,anemia,  bilateral leg edema, status post appendectomy and prostate surgery. he is a former smoker and has not smoked since 1994 he had a venous duplex study done of the right lower extremity in 2012 which was negative for a deep vein thrombosis. he has not had an arterial study done at anytime as per the medical records 01/27/2016 -- he recently had workup done at [facility] and his lower extremity venous duplex for reflux showed chronic deep vein thrombosis in the left femoral and popliteal veins, no evidence of lower extremity superficial thrombophlebitis bilaterally and no incompetence of the greater small saphenous veins bilaterally. Lower extremity arterial study showed resting ABI of 0.62 on the right and 1.0 on the left. Dr Lucky Cowboy saw him and recommended a right lower extremity angiogram. 02/10/2016 -- he had his procedure done on 02/07/2016 for a peripheral arterial disease of the right lower extremity and hand percutaneous transluminal angioplasty of the tibioperoneal trunk and proximal peroneal artery, right distal SFA and popliteal arteries and also had a stent placed in the right distal SFA and popliteal artery. Andrew [name] ([id number]) Objective Constitutional Pulse regular. Respirations normal and unlabored. Afebrile. Vitals Time Taken: 1:01 PM, Height: 64 in, Weight: 195 lbs, BMI: 33.5, Pulse: 110 bpm, Respiratory Rate: 20 breaths/min, Blood Pressure: 144/84 mmHg. Eyes Nonicteric. Reactive to light. Ears, Nose, Mouth, and Throat Lips, teeth, and gums WNL.Marland Kitchen Moist mucosa without lesions. Neck supple and nontender. No palpable supraclavicular or cervical adenopathy. Normal sized without goiter. Respiratory WNL. No retractions.. Cardiovascular Pedal Pulses WNL. No clubbing, cyanosis or edema. Chest Breasts symmetical and no nipple discharge.. Breast tissue WNL, no masses, lumps, or tenderness.. Lymphatic No adneopathy. No adenopathy. No  adenopathy. Musculoskeletal Adexa without tenderness or enlargement.. Digits and nails w/o clubbing, cyanosis, infection, petechiae, ischemia, or inflammatory conditions.Marland Kitchen Psychiatric Judgement and insight Intact.. No evidence of depression, anxiety, or agitation.. General Notes: there is some superficial debris which I washed out with moist saline gauze and the base of the ulcer looks clean Integumentary (Hair, Skin) No suspicious lesions. No crepitus or fluctuance. No peri-wound warmth or erythema. No masses.. Wound #1 status is Open. Original cause of wound was Gradually Appeared. The wound is located on the Right,Medial Lower Leg. The wound measures 2.3cm length x 1cm width x 0.2cm depth; 1.806cm^2 area and 0.361cm^3 volume. The wound is limited to skin breakdown. There is no tunneling or undermining Andrew [name], [name] ([id number]) noted. There is a small amount of serous drainage noted. The wound margin is distinct with the outline attached to the wound base. There is small (1-33%) pale granulation within the wound bed. There is a large (67-100%) amount of necrotic  tissue within the wound bed including Adherent Slough. The periwound skin appearance exhibited: Localized Edema, Moist, Hemosiderin Staining. The periwound skin appearance did not exhibit: Callus, Crepitus, Excoriation, Fluctuance, Friable, Induration, Rash, Scarring, Dry/Scaly, Maceration, Atrophie Blanche, Cyanosis, Ecchymosis, Mottled, Pallor, Rubor, Erythema. Periwound temperature was noted as No Abnormality. Assessment Active Problems ICD-10 I89.0 - Lymphedema, not elsewhere classified I73.9 - Peripheral vascular disease, unspecified I83.212 - Varicose veins of right lower extremity with both ulcer of calf and inflammation I70.232 - Atherosclerosis of native arteries of right leg with ulceration of calf Plan Wound Cleansing: Wound #1 Right,Medial Lower Leg: Cleanse wound with mild soap and water May Shower, gently  pat wound dry prior to applying new dressing. May shower with protection. Anesthetic: Wound #1 Right,Medial Lower Leg: Topical Lidocaine 4% cream applied to wound bed prior to debridement Primary Wound Dressing: Wound #1 Right,Medial Lower Leg: Medihoney gel Secondary Dressing: Wound #1 Right,Medial Lower Leg: Boardered Foam Dressing Dressing Change Frequency: Wound #1 Right,Medial Lower Leg: Change dressing every day. Follow-up Appointments: Wound #1 Right,Medial Lower Leg: Return Appointment in 1 week. Edema Control: Wound #1 Right,Medial Lower Leg: Andrew [name], Andrew [name] ([id number]) Patient to wear own Juxtalite/Juzo compression garment. Elevate legs to the level of the heart and pump ankles as often as possible Additional Orders / Instructions: Wound #1 Right,Medial Lower Leg: Increase protein intake. Activity as tolerated We will continue to treat him with: 1. medihoney to be applied to the wound and apply his juxta light compression stockings 2. Elevation and exercise 3. regular visits the wound center 4. having reviewed his arterial surgery notes I have recommended that he see the vascular office to schedule his follow up as planned. Electronic Signature(s) Signed: 02/10/2016 1:33:26 PM By: Christin Fudge MD, FACS Entered By: Christin Fudge on 02/10/2016 13:33:25 Andrew [name] ([id number]) -------------------------------------------------------------------------------- SuperBill Details Patient Name: Andrew [name] Date of Service: 02/10/2016 Medical Record Number: [id number] Patient Account Number: [id number] Date of Birth/Sex: 10-08-1922 (82 y.o. Male) Treating RN: Cornell Barman Primary Care Physician: Ramonita Lab Other Clinician: Referring Physician: Ramonita Lab Treating Physician/Extender: Frann Rider in Treatment: 4 Diagnosis Coding ICD-10 Codes Code Description I89.0 Lymphedema, not elsewhere classified I73.9 Peripheral vascular disease,  unspecified I83.212 Varicose veins of right lower extremity with both ulcer of calf and inflammation I70.232 Atherosclerosis of native arteries of right leg with ulceration of calf Facility Procedures CPT4 Code: [id number] Description: [id number] - WOUND CARE VISIT-LEV 2 EST PT Modifier: Quantity: 1 Physician Procedures CPT4 Code Description: [id number] - WC PHYS LEVEL 3 - EST PT ICD-10 Description Diagnosis I89.0 Lymphedema, not elsewhere classified I73.9 Peripheral vascular disease, unspecified I70.232 Atherosclerosis of native arteries of right leg with Modifier: ulceration of c Quantity: 1 alf Electronic Signature(s) Signed: 02/10/2016 1:33:41 PM By: Christin Fudge MD, FACS Entered By: Christin Fudge on 02/10/2016 13:33:40

## 2016-02-11 NOTE — Progress Notes (Signed)
EATON, DICRISTINA ([id number]) Visit Report for 02/10/2016 Arrival Information Details Patient Name: Andrew [name], Andrew [name] Date of Service: 02/10/2016 12:45 PM Medical Record Number: [id number] Patient Account Number: [id number] Date of Birth/Sex: 04-29-1923 (82 y.o. Male) Treating RN: Cornell Barman Primary Care Physician: Ramonita Lab Other Clinician: Referring Physician: Ramonita Lab Treating Physician/Extender: Frann Rider in Treatment: 4 Visit Information History Since Last Visit Added or deleted any medications: Yes Patient Arrived: Cane Any new allergies or adverse reactions: No Arrival Time: 12:55 Had a fall or experienced change in No Accompanied By: self activities of daily living that may affect Transfer Assistance: None risk of falls: Patient Identification Verified: Yes Signs or symptoms of abuse/neglect since last No Secondary Verification Process Yes visito Completed: Hospitalized since last visit: No Patient Requires Transmission- No Has Dressing in Place as Prescribed: No Based Precautions: Pain Present Now: No Patient Has Alerts: Yes Patient Alerts: ABI Non Compressible Electronic Signature(s) Signed: 02/10/2016 5:16:57 PM By: Richarda Overlie Entered By: Richarda Overlie on 02/10/2016 13:00:25 Andrew [name] ([id number]) -------------------------------------------------------------------------------- Clinic Level of Care Assessment Details Patient Name: Andrew [name] Date of Service: 02/10/2016 12:45 PM Medical Record Number: [id number] Patient Account Number: [id number] Date of Birth/Sex: Sep 24, 1922 (82 y.o. Male) Treating RN: Cornell Barman Primary Care Physician: Ramonita Lab Other Clinician: Referring Physician: Ramonita Lab Treating Physician/Extender: Frann Rider in Treatment: 4 Clinic Level of Care Assessment Items TOOL 4 Quantity Score []  - Use when only an EandM is performed on FOLLOW-UP visit 0 ASSESSMENTS - Nursing Assessment /  Reassessment []  - Reassessment of Co-morbidities (includes updates in patient status) 0 X - Reassessment of Adherence to Treatment Plan 1 5 ASSESSMENTS - Wound and Skin Assessment / Reassessment X - Simple Wound Assessment / Reassessment - one wound 1 5 []  - Complex Wound Assessment / Reassessment - multiple wounds 0 []  - Dermatologic / Skin Assessment (not related to wound area) 0 ASSESSMENTS - Focused Assessment []  - Circumferential Edema Measurements - multi extremities 0 []  - Nutritional Assessment / Counseling / Intervention 0 []  - Lower Extremity Assessment (monofilament, tuning fork, pulses) 0 []  - Peripheral Arterial Disease Assessment (using hand held doppler) 0 ASSESSMENTS - Ostomy and/or Continence Assessment and Care []  - Incontinence Assessment and Management 0 []  - Ostomy Care Assessment and Management (repouching, etc.) 0 PROCESS - Coordination of Care X - Simple Patient / Family Education for ongoing care 1 15 []  - Complex (extensive) Patient / Family Education for ongoing care 0 X - Staff obtains Programmer, systems, Records, Test Results / Process Orders 1 10 []  - Staff telephones HHA, Nursing Homes / Clarify orders / etc 0 []  - Routine Transfer to another Facility (non-emergent condition) 0 ILARIO, MARLO ([id number]) []  - Routine Hospital Admission (non-emergent condition) 0 []  - New Admissions / Biomedical engineer / Ordering NPWT, Apligraf, etc. 0 []  - Emergency Hospital Admission (emergent condition) 0 X - Simple Discharge Coordination 1 10 []  - Complex (extensive) Discharge Coordination 0 PROCESS - Special Needs []  - Pediatric / Minor Patient Management 0 []  - Isolation Patient Management 0 []  - Hearing / Language / Visual special needs 0 []  - Assessment of Community assistance (transportation, D/C planning, etc.) 0 []  - Additional assistance / Altered mentation 0 []  - Support Surface(s) Assessment (bed, cushion, seat, etc.) 0 INTERVENTIONS - Wound Cleansing /  Measurement X - Simple Wound Cleansing - one wound 1 5 []  - Complex Wound Cleansing - multiple wounds 0 X - Wound Imaging (photographs - any number of  wounds) 1 5 []  - Wound Tracing (instead of photographs) 0 X - Simple Wound Measurement - one wound 1 5 []  - Complex Wound Measurement - multiple wounds 0 INTERVENTIONS - Wound Dressings X - Small Wound Dressing one or multiple wounds 1 10 []  - Medium Wound Dressing one or multiple wounds 0 []  - Large Wound Dressing one or multiple wounds 0 []  - Application of Medications - topical 0 []  - Application of Medications - injection 0 INTERVENTIONS - Miscellaneous []  - External ear exam 0 Ledesma, Kharter D. ([id number]) []  - Specimen Collection (cultures, biopsies, blood, body fluids, etc.) 0 []  - Specimen(s) / Culture(s) sent or taken to Lab for analysis 0 []  - Patient Transfer (multiple staff / Harrel Lemon Lift / Similar devices) 0 []  - Simple Staple / Suture removal (25 or less) 0 []  - Complex Staple / Suture removal (26 or more) 0 []  - Hypo / Hyperglycemic Management (close monitor of Blood Glucose) 0 []  - Ankle / Brachial Index (ABI) - do not check if billed separately 0 X - Vital Signs 1 5 Has the patient been seen at the hospital within the last three years: Yes Total Score: 75 Level Of Care: New/Established - Level 2 Electronic Signature(s) Signed: 02/10/2016 5:15:55 PM By: Gretta Cool, RN, BSN, Kim RN, BSN Entered By: Gretta Cool, RN, BSN, Kim on 02/10/2016 13:30:50 Andrew [name] ([id number]) -------------------------------------------------------------------------------- Encounter Discharge Information Details Patient Name: Andrew [name] Date of Service: 02/10/2016 12:45 PM Medical Record Number: [id number] Patient Account Number: [id number] Date of Birth/Sex: [birth year] (82 y.o. Male) Treating RN: Cornell Barman Primary Care Physician: Ramonita Lab Other Clinician: Referring Physician: Ramonita Lab Treating Physician/Extender: Frann Rider in Treatment: 4 Encounter Discharge Information Items Discharge Pain Level: 0 Discharge Condition: Stable Ambulatory Status: Cane Discharge Destination: Home Transportation: Private Auto Accompanied By: self Schedule Follow-up Appointment: Yes Medication Reconciliation completed Yes and provided to Patient/Care Rickesha Veracruz: Provided on Clinical Summary of Care: 02/10/2016 Form Type Recipient Paper Patient CB Electronic Signature(s) Signed: 02/10/2016 5:15:55 PM By: Gretta Cool, RN, BSN, Kim RN, BSN Previous Signature: 02/10/2016 1:40:00 PM Version By: Ruthine Dose Entered By: Gretta Cool RN, BSN, Kim on 02/10/2016 13:41:47 Andrew [name] ([id number]) -------------------------------------------------------------------------------- Lower Extremity Assessment Details Patient Name: Andrew [name] Date of Service: 02/10/2016 12:45 PM Medical Record Number: [id number] Patient Account Number: [id number] Date of Birth/Sex: 10/14/1922 (82 y.o. Male) Treating RN: Cornell Barman Primary Care Physician: Ramonita Lab Other Clinician: Referring Physician: Ramonita Lab Treating Physician/Extender: Frann Rider in Treatment: 4 Edema Assessment Assessed: [Left: No] [Right: Yes] E[Left: dema] [Right: :] Vascular Assessment Claudication: Claudication Assessment [Right:None] Pulses: Posterior Tibial Palpable: [Right:Yes] Dorsalis Pedis Palpable: [Right:Yes] Extremity colors, hair growth, and conditions: Extremity Color: [Right:Hyperpigmented] Hair Growth on Extremity: [Right:Yes] Temperature of Extremity: [Right:Warm] Capillary Refill: [Right:< 3 seconds] Dependent Rubor: [Right:No] Toe Nail Assessment Left: Right: Thick: Yes Discolored: Yes Deformed: Yes Improper Length and Hygiene: Yes Electronic Signature(s) Signed: 02/10/2016 5:15:55 PM By: Gretta Cool, RN, BSN, Kim RN, BSN Entered By: Gretta Cool, RN, BSN, Kim on 02/10/2016 13:13:10 Andrew [name]  ([id number]) -------------------------------------------------------------------------------- Multi Wound Chart Details Patient Name: Andrew [name] Date of Service: 02/10/2016 12:45 PM Medical Record Number: [id number] Patient Account Number: [id number] Date of Birth/Sex: [birth year] (82 y.o. Male) Treating RN: Cornell Barman Primary Care Physician: Ramonita Lab Other Clinician: Referring Physician: Ramonita Lab Treating Physician/Extender: Frann Rider in Treatment: 4 Vital Signs Height(in): 64 Pulse(bpm): 110 Weight(lbs): 195 Blood Pressure 144/84 (mmHg): Body Mass Index(BMI): 33 Temperature(F): Respiratory Rate 20 (breaths/min): Photos: [  N/A:N/A] Wound Location: Right Lower Leg - Medial N/A N/A Wounding Event: Gradually Appeared N/A N/A Primary Etiology: Venous Leg Ulcer N/A N/A Comorbid History: Cataracts, Anemia, N/A N/A Chronic Obstructive Pulmonary Disease (COPD), Arrhythmia, Hypertension, Peripheral Venous Disease, Osteoarthritis Date Acquired: 12/14/2015 N/A N/A Weeks of Treatment: 4 N/A N/A Wound Status: Open N/A N/A Measurements L x W x D 2.3x1x0.2 N/A N/A (cm) Area (cm) : 1.806 N/A N/A Volume (cm) : 0.361 N/A N/A % Reduction in Area: 42.50% N/A N/A % Reduction in Volume: 42.50% N/A N/A Classification: Full Thickness Without N/A N/A Exposed Support Structures Exudate Amount: Small N/A N/A CARINA, ALIRE ([id number]) Exudate Type: Serous N/A N/A Exudate Color: amber N/A N/A Wound Margin: Distinct, outline attached N/A N/A Granulation Amount: Small (1-33%) N/A N/A Granulation Quality: Pale N/A N/A Necrotic Amount: Large (67-100%) N/A N/A Exposed Structures: Fascia: No N/A N/A Fat: No Tendon: No Muscle: No Joint: No Bone: No Limited to Skin Breakdown Epithelialization: None N/A N/A Periwound Skin Texture: Edema: Yes N/A N/A Excoriation: No Induration: No Callus: No Crepitus: No Fluctuance: No Friable: No Rash: No Scarring:  No Periwound Skin Moist: Yes N/A N/A Moisture: Maceration: No Dry/Scaly: No Periwound Skin Color: Hemosiderin Staining: Yes N/A N/A Atrophie Blanche: No Cyanosis: No Ecchymosis: No Erythema: No Mottled: No Pallor: No Rubor: No Temperature: No Abnormality N/A N/A Tenderness on No N/A N/A Palpation: Wound Preparation: Ulcer Cleansing: N/A N/A Rinsed/Irrigated with Saline Topical Anesthetic Applied: Other: lidocaine 4% Treatment Notes Electronic Signature(s) CHRISTORPHER, WATLEY ([id number]) Signed: 02/10/2016 5:15:55 PM By: Gretta Cool, RN, BSN, Kim RN, BSN Entered By: Gretta Cool, RN, BSN, Kim on 02/10/2016 13:27:14 Andrew [name] ([id number]) -------------------------------------------------------------------------------- Multi-Disciplinary Care Plan Details Patient Name: Andrew [name] Date of Service: 02/10/2016 12:45 PM Medical Record Number: [id number] Patient Account Number: [id number] Date of Birth/Sex: 11-14-1922 (82 y.o. Male) Treating RN: Cornell Barman Primary Care Physician: Ramonita Lab Other Clinician: Referring Physician: Ramonita Lab Treating Physician/Extender: Frann Rider in Treatment: 4 Active Inactive Venous Leg Ulcer Nursing Diagnoses: Knowledge deficit related to disease process and management Potential for venous Insuffiency (use before diagnosis confirmed) Goals: Non-invasive venous studies are completed as ordered Date Initiated: 01/13/2016 Goal Status: Active Patient will maintain optimal edema control Date Initiated: 01/13/2016 Goal Status: Active Patient/caregiver will verbalize understanding of disease process and disease management Date Initiated: 01/13/2016 Goal Status: Active Verify adequate tissue perfusion prior to therapeutic compression application Date Initiated: 01/13/2016 Goal Status: Active Interventions: Assess peripheral edema status every visit. Compression as ordered Provide education on venous  insufficiency Notes: Wound/Skin Impairment Nursing Diagnoses: Impaired tissue integrity Knowledge deficit related to ulceration/compromised skin integrity Goals: Patient/caregiver will verbalize understanding of skin care regimen Date Initiated: 01/13/2016 Andrew [name] ([id number]) Goal Status: Active Ulcer/skin breakdown will have a volume reduction of 30% by week 4 Date Initiated: 01/13/2016 Goal Status: Active Ulcer/skin breakdown will have a volume reduction of 50% by week 8 Date Initiated: 01/13/2016 Goal Status: Active Ulcer/skin breakdown will have a volume reduction of 80% by week 12 Date Initiated: 01/13/2016 Goal Status: Active Ulcer/skin breakdown will heal within 14 weeks Date Initiated: 01/13/2016 Goal Status: Active Interventions: Assess patient/caregiver ability to obtain necessary supplies Assess patient/caregiver ability to perform ulcer/skin care regimen upon admission and as needed Assess ulceration(s) every visit Provide education on ulcer and skin care Notes: Electronic Signature(s) Signed: 02/10/2016 5:15:55 PM By: Gretta Cool, RN, BSN, Kim RN, BSN Entered By: Gretta Cool, RN, BSN, Kim on 02/10/2016 13:27:01 Andrew [name] ([id number]) -------------------------------------------------------------------------------- Pain Assessment Details Patient Name:  Andrew [name] Date of Service: 02/10/2016 12:45 PM Medical Record Number: [id number] Patient Account Number: [id number] Date of Birth/Sex: 1922/10/08 (82 y.o. Male) Treating RN: Cornell Barman Primary Care Physician: Ramonita Lab Other Clinician: Referring Physician: Ramonita Lab Treating Physician/Extender: Frann Rider in Treatment: 4 Active Problems Location of Pain Severity and Description of Pain Patient Has Paino No Site Locations With Dressing Change: No Pain Management and Medication Current Pain Management: Electronic Signature(s) Signed: 02/10/2016 5:15:55 PM By: Gretta Cool, RN, BSN, Kim RN,  BSN Entered By: Gretta Cool, RN, BSN, Kim on 02/10/2016 13:10:10 Andrew [name] ([id number]) -------------------------------------------------------------------------------- Patient/Caregiver Education Details Patient Name: Andrew [name] Date of Service: 02/10/2016 12:45 PM Medical Record Number: [id number] Patient Account Number: [id number] Date of Birth/Gender: 08-12-1922 (82 y.o. Male) Treating RN: Cornell Barman Primary Care Physician: Ramonita Lab Other Clinician: Referring Physician: Ramonita Lab Treating Physician/Extender: Frann Rider in Treatment: 4 Education Assessment Education Provided To: Patient Education Topics Provided Wound/Skin Impairment: Handouts: Other: continue wound care as prescribed Electronic Signature(s) Signed: 02/10/2016 5:15:55 PM By: Gretta Cool, RN, BSN, Kim RN, BSN Entered By: Gretta Cool, RN, BSN, Kim on 02/10/2016 13:42:36 Andrew [name] ([id number]) -------------------------------------------------------------------------------- Wound Assessment Details Patient Name: Andrew [name] Date of Service: 02/10/2016 12:45 PM Medical Record Number: [id number] Patient Account Number: [id number] Date of Birth/Sex: 08/12/1922 (82 y.o. Male) Treating RN: Cornell Barman Primary Care Physician: Ramonita Lab Other Clinician: Referring Physician: Ramonita Lab Treating Physician/Extender: Frann Rider in Treatment: 4 Wound Status Wound Number: 1 Primary Venous Leg Ulcer Etiology: Wound Location: Right Lower Leg - Medial Wound Open Wounding Event: Gradually Appeared Status: Date Acquired: 12/14/2015 Comorbid Cataracts, Anemia, Chronic Obstructive Weeks Of Treatment: 4 History: Pulmonary Disease (COPD), Clustered Wound: No Arrhythmia, Hypertension, Peripheral Venous Disease, Osteoarthritis Photos Wound Measurements Length: (cm) 2.3 Width: (cm) 1 Depth: (cm) 0.2 Area: (cm) 1.806 Volume: (cm) 0.361 % Reduction in Area: 42.5% % Reduction in  Volume: 42.5% Epithelialization: None Tunneling: No Undermining: No Wound Description Full Thickness Without Exposed Classification: Support Structures Wound Margin: Distinct, outline attached Exudate Small Amount: Exudate Type: Serous Exudate Color: amber Foul Odor After Cleansing: No Wound Bed Granulation Amount: Small (1-33%) Exposed Structure Granulation Quality: Pale Fascia Exposed: No Lett, Keene D. ([id number]) Necrotic Amount: Large (67-100%) Fat Layer Exposed: No Necrotic Quality: Adherent Slough Tendon Exposed: No Muscle Exposed: No Joint Exposed: No Bone Exposed: No Limited to Skin Breakdown Periwound Skin Texture Texture Color No Abnormalities Noted: No No Abnormalities Noted: No Callus: No Atrophie Blanche: No Crepitus: No Cyanosis: No Excoriation: No Ecchymosis: No Fluctuance: No Erythema: No Friable: No Hemosiderin Staining: Yes Induration: No Mottled: No Localized Edema: Yes Pallor: No Rash: No Rubor: No Scarring: No Temperature / Pain Moisture Temperature: No Abnormality No Abnormalities Noted: No Dry / Scaly: No Maceration: No Moist: Yes Wound Preparation Ulcer Cleansing: Rinsed/Irrigated with Saline Topical Anesthetic Applied: Other: lidocaine 4%, Treatment Notes Wound #1 (Right, Medial Lower Leg) 1. Cleansed with: Clean wound with Normal Saline 4. Dressing Applied: Medihoney Gel 5. Secondary Dressing Applied Bordered Foam Dressing Electronic Signature(s) Signed: 02/10/2016 5:15:55 PM By: Gretta Cool, RN, BSN, Kim RN, BSN Entered By: Gretta Cool, RN, BSN, Kim on 02/10/2016 13:16:28 Andrew [name] ([id number]) -------------------------------------------------------------------------------- Vitals Details Patient Name: Andrew [name] Date of Service: 02/10/2016 12:45 PM Medical Record Number: [id number] Patient Account Number: [id number] Date of Birth/Sex: [birth year] (82 y.o. Male) Treating RN: Cornell Barman Primary Care Physician:  Ramonita Lab Other Clinician: Referring Physician: Ramonita Lab Treating Physician/Extender: Frann Rider in Treatment: 4 Vital Signs  Time Taken: 13:01 Pulse (bpm): 110 Height (in): 64 Respiratory Rate (breaths/min): 20 Weight (lbs): 195 Blood Pressure (mmHg): 144/84 Body Mass Index (BMI): 33.5 Reference Range: 80 - 120 mg / dl Electronic Signature(s) Signed: 02/10/2016 5:15:55 PM By: Gretta Cool, RN, BSN, Kim RN, BSN Entered By: Gretta Cool, RN, BSN, Kim on 02/10/2016 13:01:51

## 2016-02-17 ENCOUNTER — Encounter (HOSPITAL_BASED_OUTPATIENT_CLINIC_OR_DEPARTMENT_OTHER): Payer: Medicare Other | Admitting: General Surgery

## 2016-02-17 DIAGNOSIS — I739 Peripheral vascular disease, unspecified: Secondary | ICD-10-CM

## 2016-02-17 DIAGNOSIS — I83212 Varicose veins of right lower extremity with both ulcer of calf and inflammation: Secondary | ICD-10-CM | POA: Diagnosis not present

## 2016-02-17 NOTE — Progress Notes (Signed)
See I heal note 

## 2016-02-18 NOTE — Progress Notes (Signed)
Andrew [name] ([id number]) Visit Report for 02/17/2016 Chief Complaint Document Details Patient Name: Andrew [name], Andrew [name] Date of Service: 02/17/2016 1:30 PM Medical Record Number: [id number] Patient Account Number: [id number] Date of Birth/Sex: 1923/04/07 (82 y.o. Male) Treating RN: Cornell Barman Primary Care Physician: Ramonita Lab Other Clinician: Referring Physician: Ramonita Lab Treating Physician/Extender: Benjaman Pott in Treatment: 5 Information Obtained from: Patient Chief Complaint Patient presents for treatment of an open ulcer due to venous insufficiency which is had on and off on the right lower extremity for several months Electronic Signature(s) Signed: 02/17/2016 1:42:40 PM By: Judene Companion MD Entered By: Judene Companion on 02/17/2016 13:42:40 Andrew [name] ([id number]) -------------------------------------------------------------------------------- Debridement Details Patient Name: Andrew [name] Date of Service: 02/17/2016 1:30 PM Medical Record Number: [id number] Patient Account Number: [id number] Date of Birth/Sex: 1923/07/02 (82 y.o. Male) Treating RN: Cornell Barman Primary Care Physician: Ramonita Lab Other Clinician: Referring Physician: Ramonita Lab Treating Physician/Extender: Judene Companion Weeks in Treatment: 5 Debridement Performed for Wound #1 Right,Medial Lower Leg Assessment: Performed By: Physician Judene Companion, MD Debridement: Debridement Pre-procedure Yes Verification/Time Out Taken: Start Time: 13:35 Pain Control: Other : lidocaine 4% Level: Skin/Subcutaneous Tissue Total Area Debrided (L x 2.2 (cm) x 2 (cm) = 4.4 (cm) W): Tissue and other Viable, Non-Viable, Fibrin/Slough, Subcutaneous material debrided: Instrument: Curette Bleeding: Minimum Hemostasis Achieved: Pressure End Time: 13:41 Procedural Pain: 1 Post Procedural Pain: 1 Response to Treatment: Procedure was tolerated well Post Debridement Measurements of Total  Wound Length: (cm) 2.2 Width: (cm) 2 Depth: (cm) 0.2 Volume: (cm) 0.691 Post Procedure Diagnosis Same as Pre-procedure Electronic Signature(s) Signed: 02/17/2016 5:06:33 PM By: Gretta Cool, RN, BSN, Kim RN, BSN Signed: 02/18/2016 8:09:12 AM By: Judene Companion MD Entered By: Gretta Cool, RN, BSN, Kim on 02/17/2016 13:41:33 Andrew [name] ([id number]) -------------------------------------------------------------------------------- HPI Details Patient Name: Andrew [name] Date of Service: 02/17/2016 1:30 PM Medical Record Number: [id number] Patient Account Number: [id number] Date of Birth/Sex: January 19, 1923 (82 y.o. Male) Treating RN: Cornell Barman Primary Care Physician: Ramonita Lab Other Clinician: Referring Physician: Ramonita Lab Treating Physician/Extender: Judene Companion Weeks in Treatment: 5 History of Present Illness Location: right lower extremity ulceration Quality: Patient reports experiencing a dull pain to affected area(s). Severity: Patient states wound are getting worse. Duration: Patient has had the wound for > 3 months prior to seeking treatment at the wound center Timing: Pain in wound is Intermittent (comes and goes Context: The wound appeared gradually over time Modifying Factors: Other treatment(s) tried include:local care with hydrogen peroxide and alcohol Associated Signs and Symptoms: Patient reports having increase swelling. HPI Description: 82 year old gentleman who was seen by his PCP Dr. Kerrin Mo for a ulceration on the right calf which she's had for several months. The patient also has past medical history significant for hypertension, COPD, chronic kidney disease stage III,anemia, bilateral leg edema, status post appendectomy and prostate surgery. he is a former smoker and has not smoked since 1994 he had a venous duplex study done of the right lower extremity in 2012 which was negative for a deep vein thrombosis. he has not had an arterial study done at anytime as  per the medical records 01/27/2016 -- he recently had workup done at [facility] and his lower extremity venous duplex for reflux showed chronic deep vein thrombosis in the left femoral and popliteal veins, no evidence of lower extremity superficial thrombophlebitis bilaterally and no incompetence of the greater small saphenous veins bilaterally. Lower extremity arterial study showed resting ABI of 0.62 on  the right and 1.0 on the left. Dr Lucky Cowboy saw him and recommended a right lower extremity angiogram. 02/10/2016 -- he had his procedure done on 02/07/2016 for a peripheral arterial disease of the right lower extremity and hand percutaneous transluminal angioplasty of the tibioperoneal trunk and proximal peroneal artery, right distal SFA and popliteal arteries and also had a stent placed in the right distal SFA and popliteal artery. Electronic Signature(s) Signed: 02/17/2016 1:42:50 PM By: Judene Companion MD Entered By: Judene Companion on 02/17/2016 13:42:50 Andrew [name] ([id number]) -------------------------------------------------------------------------------- Physical Exam Details Patient Name: Andrew [name] Date of Service: 02/17/2016 1:30 PM Medical Record Number: [id number] Patient Account Number: [id number] Date of Birth/Sex: 01/19/1923 (82 y.o. Male) Treating RN: Cornell Barman Primary Care Physician: Ramonita Lab Other Clinician: Referring Physician: Ramonita Lab Treating Physician/Extender: Benjaman Pott in Treatment: 5 Electronic Signature(s) Signed: 02/17/2016 1:42:59 PM By: Judene Companion MD Entered By: Judene Companion on 02/17/2016 13:42:59 Andrew [name] ([id number]) -------------------------------------------------------------------------------- Physician Orders Details Patient Name: Andrew [name] Date of Service: 02/17/2016 1:30 PM Medical Record Number: [id number] Patient Account Number: [id number] Date of Birth/Sex: 1922/10/11 (82 y.o.  Male) Treating RN: Cornell Barman Primary Care Physician: Ramonita Lab Other Clinician: Referring Physician: Ramonita Lab Treating Physician/Extender: Benjaman Pott in Treatment: 5 Verbal / Phone Orders: Yes Clinician: Cornell Barman Read Back and Verified: Yes Diagnosis Coding Wound Cleansing Wound #1 Right,Medial Lower Leg o Cleanse wound with mild soap and water o May Shower, gently pat wound dry prior to applying new dressing. o May shower with protection. Anesthetic Wound #1 Right,Medial Lower Leg o Topical Lidocaine 4% cream applied to wound bed prior to debridement Primary Wound Dressing Wound #1 Right,Medial Lower Leg o Aquacel Ag Secondary Dressing Wound #1 Right,Medial Lower Leg o Boardered Foam Dressing Dressing Change Frequency Wound #1 Right,Medial Lower Leg o Change dressing every day. Follow-up Appointments Wound #1 Right,Medial Lower Leg o Return Appointment in 1 week. Edema Control Wound #1 Right,Medial Lower Leg o Patient to wear own Juxtalite/Juzo compression garment. o Elevate legs to the level of the heart and pump ankles as often as possible Additional Orders / Instructions Wound #1 Right,Medial Lower Leg o Increase protein intake. o Activity as tolerated VENICE, RUYLE ([id number]) Electronic Signature(s) Signed: 02/17/2016 5:06:33 PM By: Gretta Cool RN, BSN, Kim RN, BSN Signed: 02/18/2016 8:09:12 AM By: Judene Companion MD Entered By: Gretta Cool, RN, BSN, Kim on 02/17/2016 13:40:06 MORRILL, STEENBERGEN ([id number]) -------------------------------------------------------------------------------- Problem List Details Patient Name: Andrew [name] Date of Service: 02/17/2016 1:30 PM Medical Record Number: [id number] Patient Account Number: [id number] Date of Birth/Sex: 11-11-1922 (82 y.o. 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 Male) Treating RN: Cornell Barman Primary Care Physician: Ramonita Lab Other Clinician: Referring Physician: Ramonita Lab Treating Physician/Extender: Benjaman Pott  in Treatment: 5 Subjective Chief Complaint Information obtained from Patient Patient presents for treatment of an open ulcer due to venous insufficiency which is had on and off on the right lower extremity for several months History of Present Illness (HPI) The following HPI elements were documented for the patient's wound: Location: right lower extremity ulceration Quality: Patient reports experiencing a dull pain to affected area(s). Severity: Patient states wound are getting worse. Duration: Patient has had the wound for > 3 months prior to seeking treatment at the wound center Timing: Pain in wound is Intermittent (comes and goes Context: The wound appeared gradually over time Modifying Factors: Other treatment(s) tried include:local care with hydrogen peroxide and alcohol Associated Signs and Symptoms: Patient reports having  increase swelling. 82 year old gentleman who was seen by his PCP Dr. Kerrin Mo for a ulceration on the right calf which she's had for several months. The patient also has past medical history significant for hypertension, COPD, chronic kidney disease stage III,anemia, bilateral leg edema, status post appendectomy and prostate surgery. he is a former smoker and has not smoked since 1994 he had a venous duplex study done of the right lower extremity in 2012 which was negative for a deep vein thrombosis. he has not had an arterial study done at anytime as per the medical records 01/27/2016 -- he recently had workup done at [facility] and his lower extremity venous duplex for reflux showed chronic deep vein thrombosis in the left femoral and popliteal veins, no evidence of lower extremity superficial thrombophlebitis bilaterally and no incompetence of the greater small saphenous veins bilaterally. Lower extremity arterial study showed resting ABI of 0.62 on the right and 1.0 on the left. Dr Lucky Cowboy saw him and recommended a right lower extremity angiogram. 02/10/2016 -- he had his procedure done on 02/07/2016 for a peripheral arterial disease of the right lower extremity and hand percutaneous transluminal angioplasty of the tibioperoneal trunk and proximal peroneal artery, right distal SFA and popliteal arteries and also had a stent placed in the right distal SFA and popliteal artery. CAL, LISOWSKI D. ([id number]) Objective Constitutional Vitals Time Taken: 1:17 PM, Height: 64 in, Weight: 195 lbs, BMI: 33.5, Temperature: 97.8 F, Pulse: 107 bpm, Respiratory Rate: 18 breaths/min, Blood Pressure: 140/67 mmHg. Integumentary (Hair, Skin) Wound #1 status is Open. Original cause of wound was Gradually Appeared. The wound is located on the Right,Medial Lower Leg. The wound measures 2.2cm length x 2cm width x 0.2cm depth; 3.456cm^2 area and 0.691cm^3 volume. The wound is limited  to skin breakdown. There is no tunneling or undermining noted. There is a small amount of serous drainage noted. The wound margin is distinct with the outline attached to the wound base. There is small (1-33%) pale granulation within the wound bed. There is a large (67-100%) amount of necrotic tissue within the wound bed including Adherent Slough. The periwound skin appearance exhibited: Localized Edema, Moist, Hemosiderin Staining. The periwound skin appearance did not exhibit: Callus, Crepitus, Excoriation, Fluctuance, Friable, Induration, Rash, Scarring, Dry/Scaly, Maceration, Atrophie Blanche, Cyanosis, Ecchymosis, Mottled, Pallor, Rubor, Erythema. Periwound temperature was noted as No Abnormality. 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 @ weeks. Being Rx'ed here for venous ulcer right media ankle. Today debrided  and placed silver alginate dressing. Procedures Wound #1 Wound #1 is a Venous Leg Ulcer located on the Right,Medial Lower Leg . There was a Skin/Subcutaneous Tissue Debridement [id number]) debridement with total area of 4.4 sq cm performed by Newt Lukes, Yusuf D. ([id number]) Orlondo Holycross, MD. with the following instrument(s): Curette to remove Viable and Non-Viable tissue/material including Fibrin/Slough and Subcutaneous after achieving pain control using Other (lidocaine 4%). A time out was conducted prior to the start of the procedure. A Minimum amount of bleeding was controlled with Pressure. The procedure was tolerated well with a pain level of 1 throughout and a pain level of 1 following the procedure. Post Debridement Measurements: 2.2cm length x 2cm width x  0.2cm depth; 0.691cm^3 volume. Post procedure Diagnosis Wound #1: Same as Pre-Procedure Plan Wound Cleansing: Wound #1 Right,Medial Lower Leg: Cleanse wound with mild soap and water May Shower, gently pat wound dry prior to applying new dressing. May shower with protection. Anesthetic: Wound #1 Right,Medial Lower Leg: Topical Lidocaine 4% cream applied to wound bed prior to debridement Primary Wound Dressing: Wound #1 Right,Medial Lower Leg: Aquacel Ag Secondary Dressing: Wound #1 Right,Medial Lower Leg: Boardered Foam Dressing Dressing Change Frequency: Wound #1 Right,Medial Lower Leg: Change dressing every day. Follow-up Appointments: Wound #1 Right,Medial Lower Leg: Return Appointment in 1 week. Edema Control: Wound #1 Right,Medial Lower Leg: Patient to wear own Juxtalite/Juzo compression garment. Elevate legs to the level of the heart and pump ankles as often as possible Additional Orders / Instructions: Wound #1 Right,Medial Lower Leg: Increase protein intake. Activity as tolerated Follow-Up Appointments: A follow-up appointment should be scheduled. KHIRY, VOELKEL ([id number]) Medication Reconciliation completed and provided to Patient/Care Provider. Electronic Signature(s) Signed: 02/17/2016 1:46:57 PM By: Judene Companion MD Entered By: Judene Companion on 02/17/2016 13:46:56 Andrew [name] ([id number]) -------------------------------------------------------------------------------- [place] Details Patient Name: Andrew [name] Date of Service: 02/17/2016 Medical Record Number: [id number] Patient Account Number: [id number] Date of Birth/Sex: 08/22/1922 (82 y.o. 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 I70.232 Atherosclerosis of native arteries of right leg with ulceration of calf Facility Procedures CPT4 Code: [id number] Description: [id number] - DEB SUBQ TISSUE 20 SQ CM/< ICD-10 Description Diagnosis I73.9 Peripheral vascular disease, unspecified Modifier: Quantity: 1 Physician Procedures CPT4: Description Modifier Quantity Code [id number] - WC PHYS LEVEL 3 - EST PT 1 ICD-10 Description Diagnosis 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

## 2016-02-18 NOTE — Progress Notes (Signed)
DORIEN, DENNO ([id number]) Visit Report for 02/17/2016 Arrival Information Details Patient Name: Andrew [name], Andrew [name] Date of Service: 02/17/2016 1:30 PM Medical Record Number: [id number] Patient Account Number: [id number] Date of Birth/Sex: [birth year] (82 y.o. Male) Treating RN: Cornell Barman Primary Care Physician: Ramonita Lab Other Clinician: Referring Physician: Ramonita Lab Treating Physician/Extender: Benjaman Pott in Treatment: 5 Visit Information History Since Last Visit Added or deleted any medications: No Patient Arrived: Ambulatory Any new allergies or adverse reactions: No Arrival Time: 13:13 Had a fall or experienced change in No Accompanied By: self activities of daily living that may affect Transfer Assistance: None risk of falls: Patient Identification Verified: Yes Signs or symptoms of abuse/neglect since last No Secondary Verification Process Yes visito Completed: Hospitalized since last visit: No Patient Requires Transmission- No Has Dressing in Place as Prescribed: Yes Based Precautions: Pain Present Now: No Patient Has Alerts: Yes Patient Alerts: ABI Non Compressible Electronic Signature(s) Signed: 02/17/2016 5:06:33 PM By: Gretta Cool, RN, BSN, Kim RN, BSN Entered By: Gretta Cool, RN, BSN, Kim on 02/17/2016 13:14:14 Andrew [name] ([id number]) -------------------------------------------------------------------------------- Encounter Discharge Information Details Patient Name: Andrew [name] Date of Service: 02/17/2016 1:30 PM Medical Record Number: [id number] Patient Account Number: [id number] Date of Birth/Sex: September 21, 1922 (82 y.o. Male) Treating RN: Cornell Barman Primary Care Physician: Ramonita Lab Other Clinician: Referring Physician: Ramonita Lab Treating Physician/Extender: Benjaman Pott in Treatment: 5 Encounter Discharge Information Items Discharge Pain Level: 0 Discharge Condition: Stable Ambulatory Status: Cane Discharge Destination:  Home Transportation: Other Accompanied By: self Schedule Follow-up Appointment: Yes Medication Reconciliation completed Yes and provided to Patient/Care Andrew [name]: Provided on Clinical Summary of Care: 02/17/2016 Form Type Recipient Paper Patient CB Electronic Signature(s) Signed: 02/17/2016 1:47:56 PM By: Judene Companion MD Previous Signature: 02/17/2016 1:47:04 PM Version By: Ruthine Dose Entered By: Judene Companion on 02/17/2016 13:47:55 Andrew [name], Andrew [name] ([id number]) -------------------------------------------------------------------------------- Lower Extremity Assessment Details Patient Name: Andrew [name] Date of Service: 02/17/2016 1:30 PM Medical Record Number: [id number] Patient Account Number: [id number] Date of Birth/Sex: 09-Feb-1923 (82 y.o. Male) Treating RN: Cornell Barman Primary Care Physician: Ramonita Lab Other Clinician: Referring Physician: Ramonita Lab Treating Physician/Extender: Judene Companion Weeks in Treatment: 5 Edema Assessment Assessed: [Left: No] [Right: No] Edema: [Left: Ye] [Right: s] Calf Left: Right: Point of Measurement: 34 cm From Medial Instep cm 36.5 cm Ankle Left: Right: Point of Measurement: 13 cm From Medial Instep cm 24.2 cm Vascular Assessment Claudication: Claudication Assessment [Right:None] Pulses: Posterior Tibial Dorsalis Pedis Palpable: [Right:Yes] Extremity colors, hair growth, and conditions: Extremity Color: [Right:Red] Hair Growth on Extremity: [Right:No] Temperature of Extremity: [Right:Warm] Capillary Refill: [Right:< 3 seconds] Lipodermatosclerosis: [Right:No] Toe Nail Assessment Left: Right: Thick: Yes Discolored: Yes Deformed: Yes Improper Length and Hygiene: Yes Electronic Signature(s) Signed: 02/17/2016 5:06:33 PM By: Gretta Cool, RN, BSN, Kim RN, BSN Andrew [name], [name] ([id number]) Entered By: Gretta Cool, RN, BSN, Kim on 02/17/2016 13:28:37 Andrew [name], Andrew [name]  ([id number]) -------------------------------------------------------------------------------- Multi Wound Chart Details Patient Name: Andrew [name] Date of Service: 02/17/2016 1:30 PM Medical Record Number: [id number] Patient Account Number: [id number] Date of Birth/Sex: 12-21-1922 (82 y.o. Male) Treating RN: Cornell Barman Primary Care Physician: Ramonita Lab Other Clinician: Referring Physician: Ramonita Lab Treating Physician/Extender: Judene Companion Weeks in Treatment: 5 Vital Signs Height(in): 64 Pulse(bpm): 107 Weight(lbs): 195 Blood Pressure 140/67 (mmHg): Body Mass Index(BMI): 33 Temperature(F): 97.8 Respiratory Rate 18 (breaths/min): Photos: [N/A:N/A] Wound Location: Right Lower Leg - Medial N/A N/A Wounding Event: Gradually Appeared N/A N/A Primary Etiology: Venous Leg Ulcer N/A N/A Comorbid History: Cataracts, Anemia,  N/A N/A Chronic Obstructive Pulmonary Disease (COPD), Arrhythmia, Hypertension, Peripheral Venous Disease, Osteoarthritis Date Acquired: 12/14/2015 N/A N/A Weeks of Treatment: 5 N/A N/A Wound Status: Open N/A N/A Measurements L x W x D 2.2x2x0.2 N/A N/A (cm) Area (cm) : 3.456 N/A N/A Volume (cm) : 0.691 N/A N/A % Reduction in Area: -10.00% N/A N/A % Reduction in Volume: -10.00% N/A N/A Classification: Full Thickness Without N/A N/A Exposed Support Structures Exudate Amount: Small N/A N/A BLAYTON, FARE ([id number]) Exudate Type: Serous N/A N/A Exudate Color: amber N/A N/A Wound Margin: Distinct, outline attached N/A N/A Granulation Amount: Small (1-33%) N/A N/A Granulation Quality: Pale N/A N/A Necrotic Amount: Large (67-100%) N/A N/A Exposed Structures: Fascia: No N/A N/A Fat: No Tendon: No Muscle: No Joint: No Bone: No Limited to Skin Breakdown Epithelialization: None N/A N/A Periwound Skin Texture: Edema: Yes N/A N/A Excoriation: No Induration: No Callus: No Crepitus: No Fluctuance: No Friable: No Rash:  No Scarring: No Periwound Skin Moist: Yes N/A N/A Moisture: Maceration: No Dry/Scaly: No Periwound Skin Color: Hemosiderin Staining: Yes N/A N/A Atrophie Blanche: No Cyanosis: No Ecchymosis: No Erythema: No Mottled: No Pallor: No Rubor: No Temperature: No Abnormality N/A N/A Tenderness on No N/A N/A Palpation: Wound Preparation: Ulcer Cleansing: N/A N/A Rinsed/Irrigated with Saline Topical Anesthetic Applied: Other: lidocaine 4% Treatment Notes Electronic Signature(s) Andrew [name], Andrew [name] ([id number]) Signed: 02/17/2016 5:06:33 PM By: Gretta Cool, RN, BSN, Kim RN, BSN Entered By: Gretta Cool, RN, BSN, Kim on 02/17/2016 13:26:53 Andrew [name] ([id number]) -------------------------------------------------------------------------------- [place] Details Patient Name: Andrew [name] Date of Service: 02/17/2016 1:30 PM Medical Record Number: [id number] Patient Account Number: [id number] Date of Birth/Sex: February 26, 1923 (82 y.o. Male) Treating RN: Cornell Barman Primary Care Physician: Ramonita Lab Other Clinician: Referring Physician: Ramonita Lab Treating Physician/Extender: Benjaman Pott in Treatment: 5 Active Inactive Venous Leg Ulcer Nursing Diagnoses: Knowledge deficit related to disease process and management Potential for venous Insuffiency (use before diagnosis confirmed) Goals: Non-invasive venous studies are completed as ordered Date Initiated: 01/13/2016 Goal Status: Active Patient will maintain optimal edema control Date Initiated: 01/13/2016 Goal Status: Active Patient/caregiver will verbalize understanding of disease process and disease management Date Initiated: 01/13/2016 Goal Status: Active Verify adequate tissue perfusion prior to therapeutic compression application Date Initiated: 01/13/2016 Goal Status: Active Interventions: Assess peripheral edema status every visit. Compression as ordered Provide education on venous  insufficiency Notes: Wound/Skin Impairment Nursing Diagnoses: Impaired tissue integrity Knowledge deficit related to ulceration/compromised skin integrity Goals: Patient/caregiver will verbalize understanding of skin care regimen Date Initiated: 01/13/2016 Andrew [name] ([id number]) Goal Status: Active Ulcer/skin breakdown will have a volume reduction of 30% by week 4 Date Initiated: 01/13/2016 Goal Status: Active Ulcer/skin breakdown will have a volume reduction of 50% by week 8 Date Initiated: 01/13/2016 Goal Status: Active Ulcer/skin breakdown will have a volume reduction of 80% by week 12 Date Initiated: 01/13/2016 Goal Status: Active Ulcer/skin breakdown will heal within 14 weeks Date Initiated: 01/13/2016 Goal Status: Active Interventions: Assess patient/caregiver ability to obtain necessary supplies Assess patient/caregiver ability to perform ulcer/skin care regimen upon admission and as needed Assess ulceration(s) every visit Provide education on ulcer and skin care Notes: Electronic Signature(s) Signed: 02/17/2016 5:06:33 PM By: Gretta Cool, RN, BSN, Kim RN, BSN Entered By: Gretta Cool, RN, BSN, Kim on 02/17/2016 13:26:41 Andrew [name] ([id number]) -------------------------------------------------------------------------------- Pain Assessment Details Patient Name: Andrew [name] Date of Service: 02/17/2016 1:30 PM Medical Record Number: [id number] Patient Account Number: [id number] Date of Birth/Sex: 1923-04-19 (82 y.o. Male) Treating RN: Gretta Cool,  Maudie Mercury Primary Care Physician: Ramonita Lab Other Clinician: Referring Physician: Ramonita Lab Treating Physician/Extender: Benjaman Pott in Treatment: 5 Active Problems Location of Pain Severity and Description of Pain Patient Has Paino No Site Locations With Dressing Change: No Pain Management and Medication Current Pain Management: Electronic Signature(s) Signed: 02/17/2016 5:06:33 PM By: Gretta Cool, RN, BSN, Kim RN,  BSN Entered By: Gretta Cool, RN, BSN, Kim on 02/17/2016 13:14:22 Andrew [name] ([id number]) -------------------------------------------------------------------------------- Patient/Caregiver Education Details Patient Name: Andrew [name] Date of Service: 02/17/2016 1:30 PM Medical Record Number: [id number] Patient Account Number: [id number] Date of Birth/Gender: 04-Mar-1923 (82 y.o. Male) Treating RN: Cornell Barman Primary Care Physician: Ramonita Lab Other Clinician: Referring Physician: Ramonita Lab Treating Physician/Extender: Benjaman Pott in Treatment: 5 Education Assessment Education Provided To: Patient Education Topics Provided Wound/Skin Impairment: Handouts: Caring for Your Ulcer, Other: wound care as prescribed Methods: Demonstration Responses: State content correctly Electronic Signature(s) Signed: 02/18/2016 8:09:12 AM By: Judene Companion MD Entered By: Judene Companion on 02/17/2016 13:48:06 Andrew [name] ([id number]) -------------------------------------------------------------------------------- Wound Assessment Details Patient Name: Andrew [name] Date of Service: 02/17/2016 1:30 PM Medical Record Number: [id number] Patient Account Number: [id number] Date of Birth/Sex: 21-Dec-1922 (82 y.o. Male) Treating RN: Cornell Barman Primary Care Physician: Ramonita Lab Other Clinician: Referring Physician: Ramonita Lab Treating Physician/Extender: Judene Companion Weeks in Treatment: 5 Wound Status Wound Number: 1 Primary Venous Leg Ulcer Etiology: Wound Location: Right Lower Leg - Medial Wound Open Wounding Event: Gradually Appeared Status: Date Acquired: 12/14/2015 Comorbid Cataracts, Anemia, Chronic Obstructive Weeks Of Treatment: 5 History: Pulmonary Disease (COPD), Clustered Wound: No Arrhythmia, Hypertension, Peripheral Venous Disease, Osteoarthritis Photos Wound Measurements Length: (cm) 2.2 Width: (cm) 2 Depth: (cm) 0.2 Area: (cm) 3.456 Volume: (cm)  0.691 % Reduction in Area: -10% % Reduction in Volume: -10% Epithelialization: None Tunneling: No Undermining: No Wound Description Full Thickness Without Exposed Classification: Support Structures Wound Margin: Distinct, outline attached Exudate Small Amount: Exudate Type: Serous Exudate Color: amber Foul Odor After Cleansing: No Wound Bed Granulation Amount: Small (1-33%) Exposed Structure Granulation Quality: Pale Fascia Exposed: No Andrew [name], [name] ([id number]) Necrotic Amount: Large (67-100%) Fat Layer Exposed: No Necrotic Quality: Adherent Slough Tendon Exposed: No Muscle Exposed: No Joint Exposed: No Bone Exposed: No Limited to Skin Breakdown Periwound Skin Texture Texture Color No Abnormalities Noted: No No Abnormalities Noted: No Callus: No Atrophie Blanche: No Crepitus: No Cyanosis: No Excoriation: No Ecchymosis: No Fluctuance: No Erythema: No Friable: No Hemosiderin Staining: Yes Induration: No Mottled: No Localized Edema: Yes Pallor: No Rash: No Rubor: No Scarring: No Temperature / Pain Moisture Temperature: No Abnormality No Abnormalities Noted: No Dry / Scaly: No Maceration: No Moist: Yes Wound Preparation Ulcer Cleansing: Rinsed/Irrigated with Saline Topical Anesthetic Applied: Other: lidocaine 4%, Treatment Notes Wound #1 (Right, Medial Lower Leg) 1. Cleansed with: Clean wound with Normal Saline 2. Anesthetic Topical Lidocaine 4% cream to wound bed prior to debridement 4. Dressing Applied: Aquacel Ag 5. Secondary Dressing Applied Bordered Foam Dressing Electronic Signature(s) Signed: 02/17/2016 5:06:33 PM By: Gretta Cool, RN, BSN, Kim RN, BSN Entered By: Gretta Cool, RN, BSN, Kim on 02/17/2016 13:23:48 Andrew [name] ([id number]) -------------------------------------------------------------------------------- Vitals Details Patient Name: Andrew [name] Date of Service: 02/17/2016 1:30 PM Medical Record Number: [id number] Patient  Account Number: [id number] Date of Birth/Sex: August 28, 1922 (81 y.o. Male) Treating RN: Cornell Barman Primary Care Physician: Ramonita Lab Other Clinician: Referring Physician: Ramonita Lab Treating Physician/Extender: Benjaman Pott in Treatment: 5 Vital Signs Time Taken: 13:17 Temperature (F): 97.8 Height (in): 64 Pulse (bpm):  107 Weight (lbs): 195 Respiratory Rate (breaths/min): 18 Body Mass Index (BMI): 33.5 Blood Pressure (mmHg): 140/67 Reference Range: 80 - 120 mg / dl Electronic Signature(s) Signed: 02/17/2016 5:06:33 PM By: Gretta Cool, RN, BSN, Kim RN, BSN Entered By: Gretta Cool, RN, BSN, Kim on 02/17/2016 13:17:45

## 2016-02-24 ENCOUNTER — Encounter: Payer: Medicare Other | Admitting: Surgery

## 2016-02-24 DIAGNOSIS — I83212 Varicose veins of right lower extremity with both ulcer of calf and inflammation: Secondary | ICD-10-CM | POA: Diagnosis not present

## 2016-02-25 NOTE — Progress Notes (Signed)
EMER, KAN ([id number]) Visit Report for 02/24/2016 Chief Complaint Document Details Patient Name: Andrew [name], Andrew [name] Date of Service: 02/24/2016 1:30 PM Medical Record Number: [id number] Patient Account Number: [id number] Date of Birth/Sex: Mar 04, 1923 (82 y.o. Male) Treating RN: Cornell Barman Primary Care Physician: Ramonita Lab Other Clinician: Referring Physician: Ramonita Lab Treating Physician/Extender: Frann Rider in Treatment: 6 Information Obtained from: Patient Chief Complaint Patient presents for treatment of an open ulcer due to venous insufficiency which is had on and off on the right lower extremity for several months Electronic Signature(s) Signed: 02/24/2016 2:47:49 PM By: Christin Fudge MD, FACS Entered By: Christin Fudge on 02/24/2016 14:47:49 Andrew [name] ([id number]) -------------------------------------------------------------------------------- HPI Details Patient Name: Andrew [name] Date of Service: 02/24/2016 1:30 PM Medical Record Number: [id number] Patient Account Number: [id number] Date of Birth/Sex: [birth year] (82 y.o. Male) Treating RN: Cornell Barman Primary Care Physician: Ramonita Lab Other Clinician: Referring Physician: Ramonita Lab Treating Physician/Extender: Frann Rider in Treatment: 6 History of Present Illness Location: right lower extremity ulceration Quality: Patient reports experiencing a dull pain to affected area(s). Severity: Patient states wound are getting worse. Duration: Patient has had the wound for > 3 months prior to seeking treatment at the wound center Timing: Pain in wound is Intermittent (comes and goes Context: The wound appeared gradually over time Modifying Factors: Other treatment(s) tried include:local care with hydrogen peroxide and alcohol Associated Signs and Symptoms: Patient reports having increase swelling. HPI Description: 82 year old gentleman who was seen by his PCP Dr. Kerrin Mo for a  ulceration on the right calf which she's had for several months. The patient also has past medical history significant for hypertension, COPD, chronic kidney disease stage III,anemia, bilateral leg edema, status post appendectomy and prostate surgery. he is a former smoker and has not smoked since 1994 he had a venous duplex study done of the right lower extremity in 2012 which was negative for a deep vein thrombosis. he has not had an arterial study done at anytime as per the medical records 01/27/2016 -- he recently had workup done at [facility] and his lower extremity venous duplex for reflux showed chronic deep vein thrombosis in the left femoral and popliteal veins, no evidence of lower extremity superficial thrombophlebitis bilaterally and no incompetence of the greater small saphenous veins bilaterally. Lower extremity arterial study showed resting ABI of 0.62 on the right and 1.0 on the left. Dr Lucky Cowboy saw him and recommended a right lower extremity angiogram. 02/10/2016 -- he had his procedure done on 02/07/2016 for a peripheral arterial disease of the right lower extremity and hand percutaneous transluminal angioplasty of the tibioperoneal trunk and proximal peroneal artery, right distal SFA and popliteal arteries and also had a stent placed in the right distal SFA and popliteal artery. Electronic Signature(s) Signed: 02/24/2016 2:47:54 PM By: Christin Fudge MD, FACS Entered By: Christin Fudge on 02/24/2016 14:47:54 Andrew [name] ([id number]) -------------------------------------------------------------------------------- Physical Exam Details Patient Name: Andrew [name] Date of Service: 02/24/2016 1:30 PM Medical Record Number: [id number] Patient Account Number: [id number] Date of Birth/Sex: 26-Sep-1922 (82 y.o. Male) Treating RN: Cornell Barman Primary Care Physician: Ramonita Lab Other Clinician: Referring Physician: Ramonita Lab Treating  Physician/Extender: Frann Rider in Treatment: 6 Constitutional . Pulse regular. Respirations normal and unlabored. Afebrile. . Eyes Nonicteric. Reactive to light. Ears, Nose, Mouth, and Throat Lips, teeth, and gums WNL.Marland Kitchen Moist mucosa without lesions. Neck supple and nontender. No palpable supraclavicular or cervical adenopathy. Normal sized without goiter. Respiratory WNL.  No retractions.. Cardiovascular Pedal Pulses WNL. No clubbing, cyanosis or edema. Lymphatic No adneopathy. No adenopathy. No adenopathy. Musculoskeletal Adexa without tenderness or enlargement.. Digits and nails w/o clubbing, cyanosis, infection, petechiae, ischemia, or inflammatory conditions.. Integumentary (Hair, Skin) No suspicious lesions. No crepitus or fluctuance. No peri-wound warmth or erythema. No masses.Marland Kitchen Psychiatric Judgement and insight Intact.. No evidence of depression, anxiety, or agitation.. Notes wound continues to have a lot of subcutaneous debris which is fairly dry and I have worse out nicely with moist saline gauze and no sharp debridement was done. Electronic Signature(s) Signed: 02/24/2016 2:48:25 PM By: Christin Fudge MD, FACS Entered By: Christin Fudge on 02/24/2016 14:48:25 Andrew [name] ([id number]) -------------------------------------------------------------------------------- Physician Orders Details Patient Name: Andrew [name] Date of Service: 02/24/2016 1:30 PM Medical Record Number: [id number] Patient Account Number: [id number] Date of Birth/Sex: 1922-11-19 (82 y.o. Male) Treating RN: Baruch Gouty, RN, BSN, Velva Harman Primary Care Physician: Ramonita Lab Other Clinician: Referring Physician: Ramonita Lab Treating Physician/Extender: Frann Rider in Treatment: 6 Verbal / Phone Orders: Yes Clinician: Afful, RN, BSN, Rita Read Back and Verified: Yes Diagnosis Coding Wound Cleansing Wound #1 Right,Medial Lower Leg o Cleanse wound with mild soap and water o May  Shower, gently pat wound dry prior to applying new dressing. o May shower with protection. Primary Wound Dressing Wound #1 Right,Medial Lower Leg o Medihoney gel Secondary Dressing Wound #1 Right,Medial Lower Leg o Gauze and Kerlix/Conform Dressing Change Frequency Wound #1 Right,Medial Lower Leg o Change dressing every day. Follow-up Appointments Wound #1 Right,Medial Lower Leg o Return Appointment in 1 week. Edema Control Wound #1 Right,Medial Lower Leg o Patient to wear own Juxtalite/Juzo compression garment. o Elevate legs to the level of the heart and pump ankles as often as possible Additional Orders / Instructions Wound #1 Right,Medial Lower Leg o Increase protein intake. o Activity as tolerated Electronic Signature(s) Signed: 02/24/2016 4:46:57 PM By: Christin Fudge MD, FACS Andrew [name] ([id number]) Signed: 02/24/2016 5:25:53 PM By: Regan Lemming BSN, RN Entered By: Regan Lemming on 02/24/2016 14:14:37 Andrew [name] ([id number]) -------------------------------------------------------------------------------- Problem List Details Patient Name: Andrew [name] Date of Service: 02/24/2016 1:30 PM Medical Record Number: [id number] Patient Account Number: [id number] Date of Birth/Sex: 1923/02/25 (82 y.o. 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 01/20/2016 Yes calf and inflammation I70.232 Atherosclerosis of native arteries of right leg with 01/27/2016 Yes ulceration of calf Inactive Problems Resolved Problems Electronic Signature(s) Signed: 02/24/2016  2:47:39 PM By: Christin Fudge MD, FACS Entered By: Christin Fudge on 02/24/2016 14:47:39 Andrew [name] ([id number]) -------------------------------------------------------------------------------- Progress Note Details Patient Name: Andrew [name] Date of Service: 02/24/2016 1:30 PM Medical Record Number: [id number] Patient Account Number: [id number] Date of Birth/Sex: March 19, 1923 (82 y.o. Male) Treating RN: Cornell Barman Primary Care Physician: Ramonita Lab Other Clinician: Referring Physician: Ramonita Lab Treating Physician/Extender: Frann Rider in Treatment: 6 Subjective Chief Complaint Information obtained from Patient Patient presents for treatment of an open ulcer due to venous insufficiency which is had on and off on the right lower extremity for several months History of Present Illness (HPI) The following HPI elements were documented for the patient's wound: Location: right lower extremity ulceration Quality: Patient reports experiencing a dull pain to  affected area(s). Severity: Patient states wound are getting worse. Duration: Patient has had the wound for > 3 months prior to seeking treatment at the wound center Timing: Pain in wound is Intermittent (comes and goes Context: The wound appeared gradually over time Modifying Factors: Other treatment(s) tried include:local care with hydrogen peroxide and alcohol Associated Signs and Symptoms: Patient reports having increase swelling. 82 year old gentleman who was seen by his PCP Dr. Kerrin Mo for a ulceration on the right calf which she's had for several months. The patient also has past medical history significant for hypertension, COPD, chronic kidney disease stage III,anemia, bilateral leg edema, status post appendectomy and prostate surgery. he is a former smoker and has not smoked since 1994 he had a venous duplex study done of the right lower extremity in 2012 which was negative for a deep vein thrombosis. he  has not had an arterial study done at anytime as per the medical records 01/27/2016 -- he recently had workup done at [facility] and his lower extremity venous duplex for reflux showed chronic deep vein thrombosis in the left femoral and popliteal veins, no evidence of lower extremity superficial thrombophlebitis bilaterally and no incompetence of the greater small saphenous veins bilaterally. Lower extremity arterial study showed resting ABI of 0.62 on the right and 1.0 on the left. Dr Lucky Cowboy saw him and recommended a right lower extremity angiogram. 02/10/2016 -- he had his procedure done on 02/07/2016 for a peripheral arterial disease of the right lower extremity and hand percutaneous transluminal angioplasty of the tibioperoneal trunk and proximal peroneal artery, right distal SFA and popliteal arteries and also had a stent placed in the right distal SFA and popliteal artery. [place] ([id number]) Objective Constitutional Pulse regular. Respirations normal and unlabored. Afebrile. Vitals Time Taken: 2:48 PM, Height: 64 in, Weight: 195 lbs, BMI: 33.5, Temperature: 97.7 F, Pulse: 107 bpm, Respiratory Rate: 18 breaths/min, Blood Pressure: 147/79 mmHg. Eyes Nonicteric. Reactive to light. Ears, Nose, Mouth, and Throat Lips, teeth, and gums WNL.Marland Kitchen Moist mucosa without lesions. Neck supple and nontender. No palpable supraclavicular or cervical adenopathy. Normal sized without goiter. Respiratory WNL. No retractions.. Cardiovascular Pedal Pulses WNL. No clubbing, cyanosis or edema. Lymphatic No adneopathy. No adenopathy. No adenopathy. Musculoskeletal Adexa without tenderness or enlargement.. Digits and nails w/o clubbing, cyanosis, infection, petechiae, ischemia, or inflammatory conditions.Marland Kitchen Psychiatric Judgement and insight Intact.. No evidence of depression, anxiety, or agitation.. General Notes: wound continues to have a lot of subcutaneous debris  which is fairly dry and I have worse out nicely with moist saline gauze and no sharp debridement was done. Integumentary (Hair, Skin) No suspicious lesions. No crepitus or fluctuance. No peri-wound warmth or erythema. No masses.. Wound #1 status is Open. Original cause of wound was Gradually Appeared. The wound is located on the Right,Medial Lower Leg. The wound measures 2.2cm length x 3.8cm width x 0.2cm depth; 6.566cm^2 area and 1.313cm^3 volume. The wound is limited to skin breakdown. There is no tunneling or undermining noted. There is a small amount of serous drainage noted. The wound margin is distinct with the outline attached to the wound base. There is small (1-33%) pale granulation within the wound bed. There is a large (67-100%) amount of necrotic tissue within the wound bed including Adherent Slough. The periwound skin Andrew [name], [name] ([id number]) appearance exhibited: Localized Edema, Moist, Hemosiderin Staining, Erythema. The periwound skin appearance did not exhibit: Callus, Crepitus, Excoriation, Fluctuance, Friable, Induration, Rash, Scarring, Dry/Scaly, Maceration, Atrophie Blanche, Cyanosis, Ecchymosis,  Mottled, Pallor, Rubor. The surrounding wound skin color is noted with erythema. Periwound temperature was noted as No Abnormality. 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 May shower with protection. Primary Wound Dressing: Wound #1 Right,Medial Lower Leg: Medihoney gel Secondary Dressing: Wound #1 Right,Medial Lower Leg: Gauze and  Kerlix/Conform Dressing Change Frequency: Wound #1 Right,Medial Lower Leg: Change dressing every day. Follow-up Appointments: Wound #1 Right,Medial Lower Leg: Return Appointment in 1 week. Edema Control: Wound #1 Right,Medial Lower Leg: Patient to wear own Juxtalite/Juzo compression garment. Elevate legs to the level of the heart and pump ankles as often as possible Additional Orders / Instructions: Wound #1 Right,Medial Lower Leg: Increase protein intake. Activity as tolerated Andrew [name], [name] ([id number]) We will continue to treat him with: 1. medihoney to be applied to the wound and apply light Kerlix dressing over this 2. Elevation and exercise 3. regular visits the wound center 4. I have recommended that he see the vascular office to schedule his follow up as planned. Electronic Signature(s) Signed: 02/24/2016 2:49:13 PM By: Christin Fudge MD, FACS Entered By: Christin Fudge on 02/24/2016 14:49:13 Andrew [name] ([id number]) -------------------------------------------------------------------------------- SuperBill Details Patient Name: Andrew [name] Date of Service: 02/24/2016 Medical Record Number: [id number] Patient Account Number: [id number] Date of Birth/Sex: 1923/06/16 (82 y.o. 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 I70.232 Atherosclerosis of native arteries of right leg with ulceration of calf Facility Procedures CPT4 Code: [id number] Description: [id number] - WOUND CARE VISIT-LEV 2 EST PT Modifier: Quantity: 1 Physician Procedures CPT4: Description Modifier Quantity Code [id number] - WC PHYS  LEVEL 3 - EST PT 1 ICD-10 Description Diagnosis I89.0 Lymphedema, not elsewhere classified I73.9 Peripheral vascular disease, unspecified I83.212 Varicose veins of right lower extremity  with both ulcer of calf and inflammation I70.232 Atherosclerosis of native arteries of right leg with ulceration of calf Electronic Signature(s) Signed: 02/24/2016 2:49:27 PM By: Christin Fudge MD, FACS Entered By: Christin Fudge on 02/24/2016 14:49:27

## 2016-02-25 NOTE — Progress Notes (Signed)
Andrew [name] ([id number]) Visit Report for 02/24/2016 Arrival Information Details Patient Name: Andrew [name], Andrew [name] Date of Service: 02/24/2016 1:30 PM Medical Record Number: [id number] Patient Account Number: [id number] Date of Birth/Sex: 01-Jul-1923 (82 y.o. Male) Treating RN: Cornell Barman Primary Care Physician: Ramonita Lab Other Clinician: Referring Physician: Ramonita Lab Treating Physician/Extender: Frann Rider in Treatment: 6 Visit Information History Since Last Visit Added or deleted any medications: No Patient Arrived: Andrew [name] Any new allergies or adverse reactions: No Arrival Time: 13:47 Had a fall or experienced change in No Accompanied By: self activities of daily living that may affect Transfer Assistance: None risk of falls: Patient Identification Verified: Yes Signs or symptoms of abuse/neglect since last No Secondary Verification Process Yes visito Completed: Hospitalized since last visit: No Patient Requires Transmission- No Has Dressing in Place as Prescribed: No Based Precautions: Pain Present Now: No Patient Has Alerts: Yes Patient Alerts: ABI Non Compressible Electronic Signature(s) Signed: 02/24/2016 4:59:47 PM By: Gretta Cool, RN, BSN, Kim RN, BSN Entered By: Gretta Cool, RN, BSN, Kim on 02/24/2016 13:48:12 Beacon, Bettye Boeck ([id number]) -------------------------------------------------------------------------------- Clinic Level of Care Assessment Details Patient Name: Andrew [name] Date of Service: 02/24/2016 1:30 PM Medical Record Number: [id number] Patient Account Number: [id number] Date of Birth/Sex: 24-Jan-1923 (82 y.o. Male) Treating RN: Baruch Gouty, RN, BSN, [place] Primary Care Physician: Ramonita Lab Other Clinician: Referring Physician: Ramonita Lab Treating Physician/Extender: Frann Rider in Treatment: 6 Clinic Level of Care Assessment Items TOOL 4 Quantity Score []  - Use when only an EandM is performed on FOLLOW-UP visit 0 ASSESSMENTS -  Nursing Assessment / Reassessment X - Reassessment of Co-morbidities (includes updates in patient status) 1 10 X - Reassessment of Adherence to Treatment Plan 1 5 ASSESSMENTS - Wound and Skin Assessment / Reassessment X - Simple Wound Assessment / Reassessment - one wound 1 5 []  - Complex Wound Assessment / Reassessment - multiple wounds 0 []  - Dermatologic / Skin Assessment (not related to wound area) 0 ASSESSMENTS - Focused Assessment []  - Circumferential Edema Measurements - multi extremities 0 []  - Nutritional Assessment / Counseling / Intervention 0 X - Lower Extremity Assessment (monofilament, tuning fork, pulses) 1 5 []  - Peripheral Arterial Disease Assessment (using hand held doppler) 0 ASSESSMENTS - Ostomy and/or Continence Assessment and Care []  - Incontinence Assessment and Management 0 []  - Ostomy Care Assessment and Management (repouching, etc.) 0 PROCESS - Coordination of Care X - Simple Patient / Family Education for ongoing care 1 15 []  - Complex (extensive) Patient / Family Education for ongoing care 0 []  - Staff obtains Programmer, systems, Records, Test Results / Process Orders 0 []  - Staff telephones HHA, Nursing Homes / Clarify orders / etc 0 []  - Routine Transfer to another Facility (non-emergent condition) 0 TMOTHY, PAPALEO. ([id number]) []  - Routine Hospital Admission (non-emergent condition) 0 []  - New Admissions / Biomedical engineer / Ordering NPWT, Apligraf, etc. 0 []  - Emergency Hospital Admission (emergent condition) 0 []  - Simple Discharge Coordination 0 []  - Complex (extensive) Discharge Coordination 0 PROCESS - Special Needs []  - Pediatric / Minor Patient Management 0 []  - Isolation Patient Management 0 []  - Hearing / Language / Visual special needs 0 []  - Assessment of Community assistance (transportation, D/C planning, etc.) 0 []  - Additional assistance / Altered mentation 0 []  - Support Surface(s) Assessment (bed, cushion, seat, etc.) 0 INTERVENTIONS -  Wound Cleansing / Measurement X - Simple Wound Cleansing - one wound 1 5 []  - Complex Wound Cleansing - multiple wounds 0 X -  Wound Imaging (photographs - any number of wounds) 1 5 []  - Wound Tracing (instead of photographs) 0 X - Simple Wound Measurement - one wound 1 5 []  - Complex Wound Measurement - multiple wounds 0 INTERVENTIONS - Wound Dressings X - Small Wound Dressing one or multiple wounds 1 10 []  - Medium Wound Dressing one or multiple wounds 0 []  - Large Wound Dressing one or multiple wounds 0 []  - Application of Medications - topical 0 []  - Application of Medications - injection 0 INTERVENTIONS - Miscellaneous []  - External ear exam 0 Headings, Holland D. ([id number]) []  - Specimen Collection (cultures, biopsies, blood, body fluids, etc.) 0 []  - Specimen(s) / Culture(s) sent or taken to Lab for analysis 0 []  - Patient Transfer (multiple staff / Harrel Lemon Lift / Similar devices) 0 []  - Simple Staple / Suture removal (25 or less) 0 []  - Complex Staple / Suture removal (26 or more) 0 []  - Hypo / Hyperglycemic Management (close monitor of Blood Glucose) 0 []  - Ankle / Brachial Index (ABI) - do not check if billed separately 0 X - Vital Signs 1 5 Has the patient been seen at the hospital within the last three years: Yes Total Score: 70 Level Of Care: New/Established - Level 2 Electronic Signature(s) Signed: 02/24/2016 5:25:53 PM By: Regan Lemming BSN, RN Entered By: Regan Lemming on 02/24/2016 14:15:44 Andrew [name] ([id number]) -------------------------------------------------------------------------------- Encounter Discharge Information Details Patient Name: Andrew [name] Date of Service: 02/24/2016 1:30 PM Medical Record Number: [id number] Patient Account Number: [id number] Date of Birth/Sex: 1923/02/27 (82 y.o. Male) Treating RN: Cornell Barman Primary Care Physician: Ramonita Lab Other Clinician: Referring Physician: Ramonita Lab Treating Physician/Extender: Frann Rider in Treatment: 6 Encounter Discharge Information Items Discharge Pain Level: 0 Discharge Condition: Stable Ambulatory Status: Cane Discharge Destination: Home Transportation: Private Auto Accompanied By: self Schedule Follow-up Appointment: Yes Medication Reconciliation completed Yes and provided to Patient/Care [place]: Provided on Clinical Summary of Care: 02/24/2016 Form Type Recipient Paper Patient CB Electronic Signature(s) Signed: 02/24/2016 2:22:53 PM By: Ruthine Dose Entered By: Ruthine Dose on 02/24/2016 14:22:53 Andrew [name] ([id number]) -------------------------------------------------------------------------------- Lower Extremity Assessment Details Patient Name: Andrew [name] Date of Service: 02/24/2016 1:30 PM Medical Record Number: [id number] Patient Account Number: [id number] Date of Birth/Sex: 1923-04-06 (82 y.o. Male) Treating RN: Cornell Barman Primary Care Physician: Ramonita Lab Other Clinician: Referring Physician: Ramonita Lab Treating Physician/Extender: Frann Rider in Treatment: 6 Edema Assessment Assessed: [Left: No] [Right: No] E[Left: dema] [Right: :] Calf Left: Right: Point of Measurement: 34 cm From Medial Instep cm 38.5 cm Ankle Left: Right: Point of Measurement: 13 cm From Medial Instep cm 23.5 cm Vascular Assessment Pulses: Posterior Tibial Palpable: [Right:No] Doppler: [Right:Monophasic] Dorsalis Pedis Palpable: [Right:No] Doppler: [Right:Inaudible] Extremity colors, hair growth, and conditions: Extremity Color: [Right:Pale] Hair Growth on Extremity: [Right:No] Temperature of Extremity: [Right:Warm] Capillary Refill: [Right:< 3 seconds] Lipodermatosclerosis: [Right:No] Toe Nail Assessment Left: Right: Thick: Yes Discolored: Yes Deformed: Yes Improper Length and Hygiene: Yes Electronic Signature(s) KYUSS, HESCH ([id number]) Signed: 02/24/2016 4:59:47 PM By: Gretta Cool, RN, BSN, Kim RN, BSN Entered  By: Gretta Cool, RN, BSN, Kim on 02/24/2016 13:54:26 Sarkisyan, Bettye Boeck ([id number]) -------------------------------------------------------------------------------- Multi Wound Chart Details Patient Name: Andrew [name] Date of Service: 02/24/2016 1:30 PM Medical Record Number: [id number] Patient Account Number: [id number] Date of Birth/Sex: August 21, 1922 (82 y.o. Male) Treating RN: Baruch Gouty, RN, BSN, [place] Primary Care Physician: Ramonita Lab Other Clinician: Referring Physician: Ramonita Lab Treating Physician/Extender: Frann Rider in Treatment: 6 Vital  Signs Height(in): 64 Pulse(bpm): 107 Weight(lbs): 195 Blood Pressure 147/79 (mmHg): Body Mass Index(BMI): 33 Temperature(F): 97.7 Respiratory Rate 18 (breaths/min): Photos: [N/A:N/A] Wound Location: Right Lower Leg - Medial N/A N/A Wounding Event: Gradually Appeared N/A N/A Primary Etiology: Venous Leg Ulcer N/A N/A Comorbid History: Cataracts, Anemia, N/A N/A Chronic Obstructive Pulmonary Disease (COPD), Arrhythmia, Hypertension, Peripheral Venous Disease, Osteoarthritis Date Acquired: 12/14/2015 N/A N/A Weeks of Treatment: 6 N/A N/A Wound Status: Open N/A N/A Measurements L x W x D 2.2x3.8x0.2 N/A N/A (cm) Area (cm) : 6.566 N/A N/A Volume (cm) : 1.313 N/A N/A % Reduction in Area: -109.00% N/A N/A % Reduction in Volume: -109.10% N/A N/A Classification: Full Thickness Without N/A N/A Exposed Support Structures Exudate Amount: Small N/A N/A ARLYN, JESSUP ([id number]) Exudate Type: Serous N/A N/A Exudate Color: amber N/A N/A Wound Margin: Distinct, outline attached N/A N/A Granulation Amount: Small (1-33%) N/A N/A Granulation Quality: Pale N/A N/A Necrotic Amount: Large (67-100%) N/A N/A Exposed Structures: Fascia: No N/A N/A Fat: No Tendon: No Muscle: No Joint: No Bone: No Limited to Skin Breakdown Epithelialization: None N/A N/A Periwound Skin Texture: Edema: Yes N/A N/A Excoriation:  No Induration: No Callus: No Crepitus: No Fluctuance: No Friable: No Rash: No Scarring: No Periwound Skin Moist: Yes N/A N/A Moisture: Maceration: No Dry/Scaly: No Periwound Skin Color: Erythema: Yes N/A N/A Hemosiderin Staining: Yes Atrophie Blanche: No Cyanosis: No Ecchymosis: No Mottled: No Pallor: No Rubor: No Temperature: No Abnormality N/A N/A Tenderness on No N/A N/A Palpation: Wound Preparation: Ulcer Cleansing: N/A N/A Rinsed/Irrigated with Saline Topical Anesthetic Applied: Other: lidocaine 4% Treatment Notes Electronic Signature(s) TASMAN, EDELMAN ([id number]) Signed: 02/24/2016 5:25:53 PM By: Regan Lemming BSN, RN Entered By: Regan Lemming on 02/24/2016 14:11:16 Andrew [name] ([id number]) -------------------------------------------------------------------------------- Multi-Disciplinary Care Plan Details Patient Name: Andrew [name] Date of Service: 02/24/2016 1:30 PM Medical Record Number: [id number] Patient Account Number: [id number] Date of Birth/Sex: 1923-02-11 (81 y.o. Male) Treating RN: Baruch Gouty, RN, BSN, [place] Primary Care Physician: Ramonita Lab Other Clinician: Referring Physician: Ramonita Lab Treating Physician/Extender: Frann Rider in Treatment: 6 Active Inactive Venous Leg Ulcer Nursing Diagnoses: Knowledge deficit related to disease process and management Potential for venous Insuffiency (use before diagnosis confirmed) Goals: Non-invasive venous studies are completed as ordered Date Initiated: 01/13/2016 Goal Status: Active Patient will maintain optimal edema control Date Initiated: 01/13/2016 Goal Status: Active Patient/caregiver will verbalize understanding of disease process and disease management Date Initiated: 01/13/2016 Goal Status: Active Verify adequate tissue perfusion prior to therapeutic compression application Date Initiated: 01/13/2016 Goal Status: Active Interventions: Assess peripheral edema status every  visit. Compression as ordered Provide education on venous insufficiency Notes: Wound/Skin Impairment Nursing Diagnoses: Impaired tissue integrity Knowledge deficit related to ulceration/compromised skin integrity Goals: Patient/caregiver will verbalize understanding of skin care regimen Date Initiated: 01/13/2016 Andrew [name] ([id number]) Goal Status: Active Ulcer/skin breakdown will have a volume reduction of 30% by week 4 Date Initiated: 01/13/2016 Goal Status: Active Ulcer/skin breakdown will have a volume reduction of 50% by week 8 Date Initiated: 01/13/2016 Goal Status: Active Ulcer/skin breakdown will have a volume reduction of 80% by week 12 Date Initiated: 01/13/2016 Goal Status: Active Ulcer/skin breakdown will heal within 14 weeks Date Initiated: 01/13/2016 Goal Status: Active Interventions: Assess patient/caregiver ability to obtain necessary supplies Assess patient/caregiver ability to perform ulcer/skin care regimen upon admission and as needed Assess ulceration(s) every visit Provide education on ulcer and skin care Notes: Electronic Signature(s) Signed: 02/24/2016 5:25:53 PM By: Regan Lemming BSN, RN Entered  By: Regan Lemming on 02/24/2016 14:10:52 Barcomb, Bettye Boeck ([id number]) -------------------------------------------------------------------------------- Pain Assessment Details Patient Name: ZYIAN, BROKENSHIRE Date of Service: 02/24/2016 1:30 PM Medical Record Number: [id number] Patient Account Number: [id number] Date of Birth/Sex: [birth year] (82 y.o. Male) Treating RN: Cornell Barman Primary Care Physician: Ramonita Lab Other Clinician: Referring Physician: Ramonita Lab Treating Physician/Extender: Frann Rider in Treatment: 6 Active Problems Location of Pain Severity and Description of Pain Patient Has Paino No Site Locations With Dressing Change: No Pain Management and Medication Current Pain Management: Electronic Signature(s) Signed: 02/24/2016  4:59:47 PM By: Gretta Cool, RN, BSN, Kim RN, BSN Entered By: Gretta Cool, RN, BSN, Kim on 02/24/2016 13:48:21 Andrew [name] ([id number]) -------------------------------------------------------------------------------- Patient/Caregiver Education Details Patient Name: Andrew [name] Date of Service: 02/24/2016 1:30 PM Medical Record Number: [id number] Patient Account Number: [id number] Date of Birth/Gender: 04-11-1923 (82 y.o. Male) Treating RN: Cornell Barman Primary Care Physician: Ramonita Lab Other Clinician: Referring Physician: Ramonita Lab Treating Physician/Extender: Frann Rider in Treatment: 6 Education Assessment Education Provided To: Patient Education Topics Provided Wound/Skin Impairment: Handouts: Caring for Your Ulcer, Other: wound care as prescribed Methods: Demonstration, Explain/Verbal Responses: State content correctly Electronic Signature(s) Signed: 02/24/2016 4:59:47 PM By: Gretta Cool, RN, BSN, Kim RN, BSN Entered By: Gretta Cool, RN, BSN, Kim on 02/24/2016 14:22:20 Andrew [name] ([id number]) -------------------------------------------------------------------------------- Wound Assessment Details Patient Name: Andrew [name] Date of Service: 02/24/2016 1:30 PM Medical Record Number: [id number] Patient Account Number: [id number] Date of Birth/Sex: [birth year] (82 y.o. Male) Treating RN: Cornell Barman Primary Care Physician: Ramonita Lab Other Clinician: Referring Physician: Ramonita Lab Treating Physician/Extender: Frann Rider in Treatment: 6 Wound Status Wound Number: 1 Primary Venous Leg Ulcer Etiology: Wound Location: Right Lower Leg - Medial Wound Open Wounding Event: Gradually Appeared Status: Date Acquired: 12/14/2015 Comorbid Cataracts, Anemia, Chronic Obstructive Weeks Of Treatment: 6 History: Pulmonary Disease (COPD), Clustered Wound: No Arrhythmia, Hypertension, Peripheral Venous Disease, Osteoarthritis Photos Wound Measurements Length: (cm)  2.2 Width: (cm) 3.8 Depth: (cm) 0.2 Area: (cm) 6.566 Volume: (cm) 1.313 % Reduction in Area: -109% % Reduction in Volume: -109.1% Epithelialization: None Tunneling: No Undermining: No Wound Description Full Thickness Without Exposed Foul Odor Afte Classification: Support Structures Wound Margin: Distinct, outline attached Exudate Small Amount: Exudate Type: Serous Exudate Color: amber r Cleansing: No Wound Bed Granulation Amount: Small (1-33%) Exposed Structure Granulation Quality: Pale Fascia Exposed: No Bowley, Capers D. ([id number]) Necrotic Amount: Large (67-100%) Fat Layer Exposed: No Necrotic Quality: Adherent Slough Tendon Exposed: No Muscle Exposed: No Joint Exposed: No Bone Exposed: No Limited to Skin Breakdown Periwound Skin Texture Texture Color No Abnormalities Noted: No No Abnormalities Noted: No Callus: No Atrophie Blanche: No Crepitus: No Cyanosis: No Excoriation: No Ecchymosis: No Fluctuance: No Erythema: Yes Friable: No Hemosiderin Staining: Yes Induration: No Mottled: No Localized Edema: Yes Pallor: No Rash: No Rubor: No Scarring: No Temperature / Pain Moisture Temperature: No Abnormality No Abnormalities Noted: No Dry / Scaly: No Maceration: No Moist: Yes Wound Preparation Ulcer Cleansing: Rinsed/Irrigated with Saline Topical Anesthetic Applied: Other: lidocaine 4%, Treatment Notes Wound #1 (Right, Medial Lower Leg) 1. Cleansed with: Clean wound with Normal Saline 2. Anesthetic Topical Lidocaine 4% cream to wound bed prior to debridement 4. Dressing Applied: Medihoney Gel 5. Secondary [place] 7. Secured with Self adhesive bandage Electronic Signature(s) Signed: 02/24/2016 4:59:47 PM By: Gretta Cool, RN, BSN, Kim RN, BSN Entered By: Gretta Cool, RN, BSN, Kim on 02/24/2016 13:56:19 KAZIM, PIERMAN ([id number]) SAIVON, HOOBLER  ([id number]) -------------------------------------------------------------------------------- Vitals Details Patient Name:  Andrew [name] Date of Service: 02/24/2016 1:30 PM Medical Record Number: [id number] Patient Account Number: [id number] Date of Birth/Sex: 1923/01/17 (82 y.o. Male) Treating RN: Cornell Barman Primary Care Physician: Ramonita Lab Other Clinician: Referring Physician: Ramonita Lab Treating Physician/Extender: Frann Rider in Treatment: 6 Vital Signs Time Taken: 14:48 Temperature (F): 97.7 Height (in): 64 Pulse (bpm): 107 Weight (lbs): 195 Respiratory Rate (breaths/min): 18 Body Mass Index (BMI): 33.5 Blood Pressure (mmHg): 147/79 Reference Range: 80 - 120 mg / dl Electronic Signature(s) Signed: 02/24/2016 4:59:47 PM By: Gretta Cool, RN, BSN, Kim RN, BSN Entered By: Gretta Cool, RN, BSN, Kim on 02/24/2016 13:48:59

## 2016-03-02 ENCOUNTER — Encounter: Payer: Medicare Other | Admitting: Surgery

## 2016-03-02 DIAGNOSIS — I83212 Varicose veins of right lower extremity with both ulcer of calf and inflammation: Secondary | ICD-10-CM | POA: Diagnosis not present

## 2016-03-03 NOTE — Progress Notes (Signed)
DANTWAN, HOSBACH ([id number]) Visit Report for 03/02/2016 Chief Complaint Document Details Patient Name: Andrew [name], Andrew [name] Date of Service: 03/02/2016 1:30 PM Medical Record Number: [id number] Patient Account Number: [id number] Date of Birth/Sex: 30-May-1923 (82 y.o. Male) Treating RN: Cornell Barman Primary Care Physician: Ramonita Lab Other Clinician: Referring Physician: Ramonita Lab Treating Physician/Extender: Frann Rider in Treatment: 7 Information Obtained from: Patient Chief Complaint Patient presents for treatment of an open ulcer due to venous insufficiency which is had on and off on the right lower extremity for several months Electronic Signature(s) Signed: 03/02/2016 1:56:53 PM By: Christin Fudge MD, FACS Entered By: Christin Fudge on 03/02/2016 13:56:53 Andrew [name] ([id number]) -------------------------------------------------------------------------------- Debridement Details Patient Name: Andrew [name] Date of Service: 03/02/2016 1:30 PM Medical Record Number: [id number] Patient Account Number: [id number] Date of Birth/Sex: September 21, 1922 (82 y.o. Male) Treating RN: Cornell Barman Primary Care Physician: Ramonita Lab Other Clinician: Referring Physician: Ramonita Lab Treating Physician/Extender: Frann Rider in Treatment: 7 Debridement Performed for Wound #1 Right,Medial Lower Leg Assessment: Performed By: Physician Christin Fudge, MD Debridement: Debridement Pre-procedure Yes - 13:34 Verification/Time Out Taken: Start Time: 13:34 Pain Control: Lidocaine 4% Topical Solution Level: Skin/Subcutaneous Tissue Total Area Debrided (L x 2.5 (cm) x 4 (cm) = 10 (cm) W): Tissue and other Viable, Non-Viable, Exudate, Fibrin/Slough, Subcutaneous material debrided: Instrument: Curette Bleeding: Minimum Hemostasis Achieved: Pressure End Time: 13:39 Procedural Pain: 0 Post Procedural Pain: 0 Response to Treatment: Procedure was tolerated well Post Debridement  Measurements of Total Wound Length: (cm) 2.5 Width: (cm) 4 Depth: (cm) 0.2 Volume: (cm) 1.571 Character of Wound/Ulcer Post Requires Further Debridement Debridement: Severity of Tissue Post Debridement: Fat layer exposed Post Procedure Diagnosis Same as Pre-procedure Electronic Signature(s) Signed: 03/02/2016 1:56:41 PM By: Christin Fudge MD, FACS Signed: 03/02/2016 4:46:05 PM By: Gretta Cool RN, BSN, Kim RN, BSN Entered By: Christin Fudge on 03/02/2016 13:56:41 Andrew [name], Andrew [name] ([id number]) Andrew [name], Andrew [name] ([id number]) -------------------------------------------------------------------------------- HPI Details Patient Name: Andrew [name] Date of Service: 03/02/2016 1:30 PM Medical Record Number: [id number] Patient Account Number: [id number] Date of Birth/Sex: 1923/05/30 (82 y.o. Male) Treating RN: Cornell Barman Primary Care Physician: Ramonita Lab Other Clinician: Referring Physician: Ramonita Lab Treating Physician/Extender: Frann Rider in Treatment: 7 History of Present Illness Location: right lower extremity ulceration Quality: Patient reports experiencing a dull pain to affected area(s). Severity: Patient states wound are getting worse. Duration: Patient has had the wound for > 3 months prior to seeking treatment at the wound center Timing: Pain in wound is Intermittent (comes and goes Context: The wound appeared gradually over time Modifying Factors: Other treatment(s) tried include:local care with hydrogen peroxide and alcohol Associated Signs and Symptoms: Patient reports having increase swelling. HPI Description: 82 year old gentleman who was seen by his PCP Dr. Kerrin Mo for a ulceration on the right calf which she's had for several months. The patient also has past medical history significant for hypertension, COPD, chronic kidney disease stage III,anemia, bilateral leg edema, status post appendectomy and prostate surgery. he is a former smoker and has not smoked  since 1994 he had a venous duplex study done of the right lower extremity in 2012 which was negative for a deep vein thrombosis. he has not had an arterial study done at anytime as per the medical records 01/27/2016 -- he recently had workup done at [facility] and his lower extremity venous duplex for reflux showed chronic deep vein thrombosis in the left femoral and popliteal veins, no evidence of lower extremity  superficial thrombophlebitis bilaterally and no incompetence of the greater small saphenous veins bilaterally. Lower extremity arterial study showed resting ABI of 0.62 on the right and 1.0 on the left. Dr Lucky Cowboy saw him and recommended a right lower extremity angiogram. 02/10/2016 -- he had his procedure done on 02/07/2016 for a peripheral arterial disease of the right lower extremity and hand percutaneous transluminal angioplasty of the tibioperoneal trunk and proximal peroneal artery, right distal SFA and popliteal arteries and also had a stent placed in the right distal SFA and popliteal artery. 03/01/2016 -- -- was seen by Dr. Weldon Inches office today by Ms. Marcelle Overlie. Is made of the fact that the patient is status post right lower extremity angiogram for arterial disease on 02/07/2016. His most recent ABI shows that on the right it is 0.44 and on the left 0.86. Plan was to see Dr. Lucky Cowboy back in the office and undergo a right lower extremity arterial duplex study to access if the stent is still patent. Electronic Signature(s) Signed: 03/02/2016 1:57:43 PM By: Christin Fudge MD, FACS Entered By: Christin Fudge on 03/02/2016 13:57:42 Andrew [name], Andrew [name] ([id number]) Andrew [name], Andrew [name] ([id number]) -------------------------------------------------------------------------------- Physical Exam Details Patient Name: Andrew [name] Date of Service: 03/02/2016 1:30 PM Medical Record Number: [id number] Patient Account Number: [id number] Date of Birth/Sex:  May 09, 1923 (82 y.o. Male) Treating RN: Cornell Barman Primary Care Physician: Ramonita Lab Other Clinician: Referring Physician: Ramonita Lab Treating Physician/Extender: Frann Rider in Treatment: 7 Constitutional . Pulse regular. Respirations normal and unlabored. Afebrile. . Eyes Nonicteric. Reactive to light. Ears, Nose, Mouth, and Throat Lips, teeth, and gums WNL.Marland Kitchen Moist mucosa without lesions. Neck supple and nontender. No palpable supraclavicular or cervical adenopathy. Normal sized without goiter. Respiratory WNL. No retractions.. Breath sounds WNL, No rubs, rales, rhonchi, or wheeze.. Cardiovascular Heart rhythm and rate regular, no murmur or gallop.. Pedal Pulses WNL. No clubbing, cyanosis or edema. Lymphatic No adneopathy. No adenopathy. No adenopathy. Musculoskeletal Adexa without tenderness or enlargement.. Digits and nails w/o clubbing, cyanosis, infection, petechiae, ischemia, or inflammatory conditions.. Integumentary (Hair, Skin) No suspicious lesions. No crepitus or fluctuance. No peri-wound warmth or erythema. No masses.Marland Kitchen Psychiatric Judgement and insight Intact.. No evidence of depression, anxiety, or agitation.. Notes the wound continues to have subcutaneous debris which are sharply removed with a #3 curet and minimal bleeding controlled with pressure. It is much better than last week. Electronic Signature(s) Signed: 03/02/2016 1:58:15 PM By: Christin Fudge MD, FACS Entered By: Christin Fudge on 03/02/2016 13:58:15 Andrew [name] ([id number]) -------------------------------------------------------------------------------- Physician Orders Details Patient Name: Andrew [name] Date of Service: 03/02/2016 1:30 PM Medical Record Number: [id number] Patient Account Number: [id number] Date of Birth/Sex: 1922-09-02 (82 y.o. Male) Treating RN: Baruch Gouty, RN, BSN, Velva Harman Primary Care Physician: Ramonita Lab Other Clinician: Referring Physician: Ramonita Lab Treating  Physician/Extender: Frann Rider in Treatment: 7 Verbal / Phone Orders: Yes Clinician: Afful, RN, BSN, Rita Read Back and Verified: Yes Diagnosis Coding Wound Cleansing Wound #1 Right,Medial Lower Leg o Cleanse wound with mild soap and water o May Shower, gently pat wound dry prior to applying new dressing. o May shower with protection. Primary Wound Dressing Wound #1 Right,Medial Lower Leg o Medihoney gel Secondary Dressing Wound #1 Right,Medial Lower Leg o Non-adherent pad - Telfa island Dressing Change Frequency Wound #1 Right,Medial Lower Leg o Change dressing every day. Follow-up Appointments Wound #1 Right,Medial Lower Leg o Return Appointment in 1 week. Edema Control Wound #1 Right,Medial Lower Leg o Patient to wear own Juxtalite/Juzo compression garment.   o Elevate legs to the level of the heart and pump ankles as often as possible Additional Orders / Instructions Wound #1 Right,Medial Lower Leg o Increase protein intake. o Activity as tolerated Electronic Signature(s) Signed: 03/02/2016 4:38:34 PM By: Christin Fudge MD, FACS SAMAAD, [place] ([id number]) Signed: 03/02/2016 5:32:33 PM By: Regan Lemming BSN, RN Entered By: Regan Lemming on 03/02/2016 13:38:56 Andrew [name] ([id number]) -------------------------------------------------------------------------------- Problem List Details Patient Name: Andrew [name] Date of Service: 03/02/2016 1:30 PM Medical Record Number: [id number] Patient Account Number: [id number] Date of Birth/Sex: Feb 17, 1923 (82 y.o. 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/20/2016 Yes calf and inflammation I70.232 Atherosclerosis of native arteries of right leg with 01/27/2016 Yes ulceration of calf Inactive Problems Resolved Problems Electronic Signature(s) Signed: 03/02/2016 1:56:21 PM By: Christin Fudge MD, FACS Entered By: Christin Fudge on 03/02/2016 13:56:21 Andrew [name] ([id number]) -------------------------------------------------------------------------------- Progress Note Details Patient Name: Andrew [name] Date of Service: 03/02/2016 1:30 PM Medical Record Number: [id number] Patient Account Number: [id number] Date of Birth/Sex: May 12, 1923 (82 y.o. Male) Treating RN: Cornell Barman Primary Care Physician: Ramonita Lab Other Clinician: Referring Physician: Ramonita Lab Treating Physician/Extender: Frann Rider in Treatment: 7 Subjective Chief Complaint Information obtained from Patient Patient presents for treatment of an open ulcer due to venous insufficiency which is had on and off on the right lower extremity for several months History of Present Illness (HPI) The following HPI elements were documented for the patient's wound: Location: right lower extremity ulceration Quality: Patient reports experiencing a dull pain to affected area(s). Severity: Patient states wound are getting worse. Duration: Patient has had the wound for > 3 months prior to seeking treatment at the wound center Timing: Pain in wound is Intermittent (comes and goes Context: The wound appeared gradually over time Modifying Factors: Other treatment(s) tried include:local care with hydrogen peroxide and alcohol Associated Signs and Symptoms: Patient reports having increase swelling. 82 year old gentleman who was seen by his PCP Dr. Kerrin Mo for a ulceration on the right calf which she's had for several months. The patient also has past medical history significant for hypertension, COPD, chronic  kidney disease stage III,anemia, bilateral leg edema, status post appendectomy and prostate surgery. he is a former smoker and has not smoked since 1994 he had a venous duplex study done of the right lower extremity in 2012 which was negative for a deep vein thrombosis. he has not had an arterial study done at anytime as per the medical records 01/27/2016 -- he recently had workup done at [facility] and his lower extremity venous duplex for reflux showed chronic deep vein thrombosis in the left femoral and popliteal veins, no evidence of lower extremity superficial thrombophlebitis bilaterally and no incompetence of the greater small saphenous veins bilaterally. Lower extremity arterial study showed resting ABI of 0.62 on the right and 1.0 on the left. Dr Lucky Cowboy saw him and recommended a right lower extremity angiogram. 02/10/2016 -- he had his procedure done on 02/07/2016 for a peripheral arterial disease of the right lower extremity and hand percutaneous transluminal angioplasty of the tibioperoneal trunk and proximal peroneal artery, right distal SFA and popliteal arteries and also had a stent placed  in the right distal SFA and popliteal artery. 03/01/2016 -- -- was seen by Dr. Lucky Cowboy s office today by Ms. Marcelle Overlie. Is made of the fact that the patient is status post right lower extremity angiogram for arterial disease on 02/07/2016. His most recent [place] ([id number]) ABI shows that on the right it is 0.44 and on the left 0.86. Plan was to see Dr. Lucky Cowboy back in the office and undergo a right lower extremity arterial duplex study to access if the stent is still patent. Objective Constitutional Pulse regular. Respirations normal and unlabored. Afebrile. Vitals Time Taken: 1:20 PM, Height: 64 in, Weight: 195 lbs, BMI: 33.5, Temperature: 97.9 F, Pulse: 103 bpm, Respiratory Rate: 18 breaths/min, Blood Pressure: 134/68 mmHg. Eyes Nonicteric. Reactive to  light. Ears, Nose, Mouth, and Throat Lips, teeth, and gums WNL.Marland Kitchen Moist mucosa without lesions. Neck supple and nontender. No palpable supraclavicular or cervical adenopathy. Normal sized without goiter. Respiratory WNL. No retractions.. Breath sounds WNL, No rubs, rales, rhonchi, or wheeze.. Cardiovascular Heart rhythm and rate regular, no murmur or gallop.. Pedal Pulses WNL. No clubbing, cyanosis or edema. Lymphatic No adneopathy. No adenopathy. No adenopathy. Musculoskeletal Adexa without tenderness or enlargement.. Digits and nails w/o clubbing, cyanosis, infection, petechiae, ischemia, or inflammatory conditions.Marland Kitchen Psychiatric Judgement and insight Intact.. No evidence of depression, anxiety, or agitation.. General Notes: the wound continues to have subcutaneous debris which are sharply removed with a #3 curet and minimal bleeding controlled with pressure. It is much better than last week. Integumentary (Hair, Skin) No suspicious lesions. No crepitus or fluctuance. No peri-wound warmth or erythema. No masses.Marland Kitchen Andrew [name], [name] ([id number]) Wound #1 status is Open. Original cause of wound was Gradually Appeared. The wound is located on the Right,Medial Lower Leg. The wound measures 2.5cm length x 4cm width x 0.2cm depth; 7.854cm^2 area and 1.571cm^3 volume. The wound is limited to skin breakdown. There is no tunneling or undermining noted. There is a small amount of serous drainage noted. The wound margin is distinct with the outline attached to the wound base. There is small (1-33%) pale granulation within the wound bed. There is a large (67-100%) amount of necrotic tissue within the wound bed including Adherent Slough. The periwound skin appearance exhibited: Localized Edema, Moist, Erythema. The periwound skin appearance did not exhibit: Callus, Crepitus, Excoriation, Fluctuance, Friable, Induration, Rash, Scarring, Dry/Scaly, Maceration, Atrophie Blanche, Cyanosis, Ecchymosis,  Hemosiderin Staining, Mottled, Pallor, Rubor. The surrounding wound skin color is noted with erythema. Periwound temperature was noted as No Abnormality. 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 There was a Skin/Subcutaneous Tissue Debridement [id number]) debridement with total area of 10 sq cm performed by Christin Fudge, MD. with the following instrument(s): Curette to remove Viable and Non-Viable tissue/material including Exudate, Fibrin/Slough, and Subcutaneous after achieving pain control using Lidocaine 4% Topical Solution. A time out was conducted at 13:34, prior to the start of the procedure. A Minimum amount of bleeding was controlled with Pressure. The procedure was tolerated well with a pain level of 0 throughout and a pain level of 0 following the procedure. Post Debridement Measurements: 2.5cm length x 4cm width x 0.2cm depth; 1.571cm^3 volume. Character of Wound/Ulcer Post Debridement requires  further debridement. Severity of Tissue Post Debridement is: Fat layer exposed. Post procedure Diagnosis Wound #1: Same as Pre-Procedure Andrew [name], Andrew [name] ([id number]) Plan Wound Cleansing: Wound #1 Right,Medial Lower Leg: Cleanse wound with mild soap and water May Shower, gently pat wound dry prior to applying new dressing. May shower with protection. Primary Wound Dressing: Wound #1 Right,Medial Lower Leg: Medihoney gel Secondary Dressing: Wound #1 Right,Medial Lower Leg: Non-adherent pad - Telfa island Dressing Change Frequency: Wound #1 Right,Medial Lower Leg: Change dressing every day. Follow-up  Appointments: Wound #1 Right,Medial Lower Leg: Return Appointment in 1 week. Edema Control: Wound #1 Right,Medial Lower Leg: Patient to wear own Juxtalite/Juzo compression garment. Elevate legs to the level of the heart and pump ankles as often as possible Additional Orders / Instructions: Wound #1 Right,Medial Lower Leg: Increase protein intake. Activity as tolerated It is very difficult to get the patient to follow instructions regarding his dressing and hence I hesitate to use Santyl ointment as I am concerned that he will missuse it and cause further ulceration. We will continue to treat him with: 1. medihoney to be applied to the wound and apply light Kerlix dressing over this 2. Elevation and exercise 3. regular visits the wound center 4. I have recommended that he see the vascular office to schedule his follow up as planned. Electronic Signature(s) Signed: 03/02/2016 1:59:51 PM By: Christin Fudge MD, FACS Entered By: Christin Fudge on 03/02/2016 13:59:51 Andrew [name], Andrew [name] ([id number]) Andrew [name], Andrew [name] ([id number]) -------------------------------------------------------------------------------- [place] Details Patient Name: Andrew [name] Date of Service: 03/02/2016 Medical Record Number: [id number] Patient Account Number: [id number] Date of Birth/Sex: Jul 13, 1922 (82 y.o. 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 I70.232 Atherosclerosis of native arteries of right leg with ulceration of calf Facility Procedures CPT4: Description Modifier Quantity Code [id number] - DEB SUBQ TISSUE 20 SQ CM/< 1 ICD-10  Description Diagnosis I89.0 Lymphedema, not elsewhere classified I73.9 Peripheral vascular disease, unspecified I83.212 Varicose veins of right lower extremity  with both ulcer of calf and inflammation I70.232 Atherosclerosis of native arteries of right leg with ulceration of calf Physician Procedures CPT4: Description Modifier Quantity Code [id number] - WC PHYS SUBQ TISS 20 SQ CM 1 ICD-10 Description Diagnosis I89.0 Lymphedema, not elsewhere classified I73.9 Peripheral vascular disease, unspecified I83.212 Varicose veins of right lower extremity  with both ulcer of calf and inflammation I70.232 Atherosclerosis of native arteries of right leg with ulceration of calf Electronic Signature(s) Signed: 03/02/2016 2:00:13 PM By: Christin Fudge MD, FACS Andrew [name], Andrew [name] D. ([id number]) Entered By: Christin Fudge on 03/02/2016 14:00:13

## 2016-03-03 NOTE — Progress Notes (Signed)
Andrew [name], Andrew [name] ([id number]) Visit Report for 03/02/2016 Arrival Information Details Patient Name: BRIEN, WILKINSON Date of Service: 03/02/2016 1:30 PM Medical Record Number: [id number] Patient Account Number: [id number] Date of Birth/Sex: January 11, 1923 (82 y.o. Male) Treating RN: Cornell Barman Primary Care Physician: Ramonita Lab Other Clinician: Referring Physician: Ramonita Lab Treating Physician/Extender: Frann Rider in Treatment: 7 Visit Information History Since Last Visit Added or deleted any medications: No Patient Arrived: Kasandra Knudsen Any new allergies or adverse reactions: No Arrival Time: 13:15 Had a fall or experienced change in No Accompanied By: self activities of daily living that may affect Transfer Assistance: None risk of falls: Patient Identification Verified: Yes Signs or symptoms of abuse/neglect since last No Secondary Verification Process Yes visito Completed: Hospitalized since last visit: No Patient Requires Transmission- No Has Dressing in Place as Prescribed: Yes Based Precautions: Pain Present Now: No Patient Has Alerts: Yes Patient Alerts: ABI Non Compressible Electronic Signature(s) Signed: 03/02/2016 4:46:05 PM By: Gretta Cool, RN, BSN, Kim RN, BSN Entered By: Gretta Cool, RN, BSN, Kim on 03/02/2016 13:20:41 Andrew [name] ([id number]) -------------------------------------------------------------------------------- Encounter Discharge Information Details Patient Name: Andrew [name] Date of Service: 03/02/2016 1:30 PM Medical Record Number: [id number] Patient Account Number: [id number] Date of Birth/Sex: 08/04/1922 (82 y.o. Male) Treating RN: Cornell Barman Primary Care Physician: Ramonita Lab Other Clinician: Referring Physician: Ramonita Lab Treating Physician/Extender: Frann Rider in Treatment: 7 Encounter Discharge Information Items Discharge Pain Level: 0 Discharge Condition: Stable Ambulatory Status: Cane Discharge Destination:  Home Transportation: Private Auto Accompanied By: self Schedule Follow-up Appointment: Yes Medication Reconciliation completed Yes and provided to Patient/Care Rosaleah Person: Provided on Clinical Summary of Care: 03/02/2016 Form Type Recipient Paper Patient CB Electronic Signature(s) Signed: 03/02/2016 5:32:33 PM By: Regan Lemming BSN, RN Previous Signature: 03/02/2016 1:45:42 PM Version By: Ruthine Dose Entered By: Regan Lemming on 03/02/2016 13:47:00 Andrew [name] ([id number]) -------------------------------------------------------------------------------- Lower Extremity Assessment Details Patient Name: Andrew [name] Date of Service: 03/02/2016 1:30 PM Medical Record Number: [id number] Patient Account Number: [id number] Date of Birth/Sex: 1922/08/21 (82 y.o. Male) Treating RN: Cornell Barman Primary Care Physician: Ramonita Lab Other Clinician: Referring Physician: Ramonita Lab Treating Physician/Extender: Frann Rider in Treatment: 7 Edema Assessment Assessed: [Left: No] [Right: No] E[Left: dema] [Right: :] Calf Left: Right: Point of Measurement: 34 cm From Medial Instep cm 35 cm Ankle Left: Right: Point of Measurement: 13 cm From Medial Instep cm 23.3 cm Vascular Assessment Pulses: Posterior Tibial Dorsalis Pedis Palpable: [Right:Yes] Extremity colors, hair growth, and conditions: Extremity Color: [Right:Red] Hair Growth on Extremity: [Right:Yes] Temperature of Extremity: [Right:Warm] Capillary Refill: [Right:< 3 seconds] Dependent Rubor: [Right:Yes] Blanched when Elevated: [Right:Yes] Lipodermatosclerosis: [Right:No] Toe Nail Assessment Left: Right: Thick: Yes Discolored: Yes Deformed: Yes Improper Length and Hygiene: Yes Electronic Signature(s) Signed: 03/02/2016 4:46:05 PM By: Gretta Cool, RN, BSN, Kim RN, BSN Felipe, Gideon D. ([id number]) Entered By: Gretta Cool, RN, BSN, Kim on 03/02/2016 13:25:23 LIOR, CWIKLINSKI  ([id number]) -------------------------------------------------------------------------------- Multi Wound Chart Details Patient Name: Andrew [name] Date of Service: 03/02/2016 1:30 PM Medical Record Number: [id number] Patient Account Number: [id number] Date of Birth/Sex: February 24, 1923 (82 y.o. Male) Treating RN: Cornell Barman Primary Care Physician: Ramonita Lab Other Clinician: Referring Physician: Ramonita Lab Treating Physician/Extender: Frann Rider in Treatment: 7 Vital Signs Height(in): 64 Pulse(bpm): 103 Weight(lbs): 195 Blood Pressure 134/68 (mmHg): Body Mass Index(BMI): 33 Temperature(F): 97.9 Respiratory Rate 18 (breaths/min): Photos: [N/A:N/A] Wound Location: Right Lower Leg - Medial N/A N/A Wounding Event: Gradually Appeared N/A N/A Primary Etiology: Arterial Insufficiency Ulcer N/A N/A  Secondary Etiology: Venous Leg Ulcer N/A N/A Comorbid History: Cataracts, Anemia, N/A N/A Chronic Obstructive Pulmonary Disease (COPD), Arrhythmia, Hypertension, Peripheral Venous Disease, Osteoarthritis Date Acquired: 12/14/2015 N/A N/A Weeks of Treatment: 7 N/A N/A Wound Status: Open N/A N/A Measurements L x W x D 2.5x4x0.2 N/A N/A (cm) Area (cm) : 7.854 N/A N/A Volume (cm) : 1.571 N/A N/A % Reduction in Area: -150.00% N/A N/A % Reduction in Volume: -150.20% N/A N/A Classification: Full Thickness Without N/A N/A Exposed Support Structures Fambrough, Iren D. ([id number]) Exudate Amount: Small N/A N/A Exudate Type: Serous N/A N/A Exudate Color: amber N/A N/A Wound Margin: Distinct, outline attached N/A N/A Granulation Amount: Small (1-33%) N/A N/A Granulation Quality: Pale N/A N/A Necrotic Amount: Large (67-100%) N/A N/A Exposed Structures: Fascia: No N/A N/A Fat: No Tendon: No Muscle: No Joint: No Bone: No Limited to Skin Breakdown Epithelialization: None N/A N/A Periwound Skin Texture: Edema: Yes N/A N/A Excoriation: No Induration: No Callus:  No Crepitus: No Fluctuance: No Friable: No Rash: No Scarring: No Periwound Skin Moist: Yes N/A N/A Moisture: Maceration: No Dry/Scaly: No Periwound Skin Color: Erythema: Yes N/A N/A Atrophie Blanche: No Cyanosis: No Ecchymosis: No Hemosiderin Staining: No Mottled: No Pallor: No Rubor: No Temperature: No Abnormality N/A N/A Tenderness on No N/A N/A Palpation: Wound Preparation: Ulcer Cleansing: N/A N/A Rinsed/Irrigated with Saline Topical Anesthetic Applied: Other: lidocaine 4% Treatment Notes SHAKIB, SCHOEPF ([id number]) Electronic Signature(s) Signed: 03/02/2016 4:46:05 PM By: Gretta Cool, RN, BSN, Kim RN, BSN Entered By: Gretta Cool, RN, BSN, Kim on 03/02/2016 13:29:07 Andrew [name] ([id number]) -------------------------------------------------------------------------------- Multi-Disciplinary Care Plan Details Patient Name: Andrew [name] Date of Service: 03/02/2016 1:30 PM Medical Record Number: [id number] Patient Account Number: [id number] Date of Birth/Sex: Dec 28, 1922 (82 y.o. Male) Treating RN: Cornell Barman Primary Care Physician: Ramonita Lab Other Clinician: Referring Physician: Ramonita Lab Treating Physician/Extender: Frann Rider in Treatment: 7 Active Inactive Venous Leg Ulcer Nursing Diagnoses: Knowledge deficit related to disease process and management Potential for venous Insuffiency (use before diagnosis confirmed) Goals: Non-invasive venous studies are completed as ordered Date Initiated: 01/13/2016 Goal Status: Active Patient will maintain optimal edema control Date Initiated: 01/13/2016 Goal Status: Active Patient/caregiver will verbalize understanding of disease process and disease management Date Initiated: 01/13/2016 Goal Status: Active Verify adequate tissue perfusion prior to therapeutic compression application Date Initiated: 01/13/2016 Goal Status: Active Interventions: Assess peripheral edema status every visit. Compression as  ordered Provide education on venous insufficiency Notes: Wound/Skin Impairment Nursing Diagnoses: Impaired tissue integrity Knowledge deficit related to ulceration/compromised skin integrity Goals: Patient/caregiver will verbalize understanding of skin care regimen Date Initiated: 01/13/2016 Andrew [name] ([id number]) Goal Status: Active Ulcer/skin breakdown will have a volume reduction of 30% by week 4 Date Initiated: 01/13/2016 Goal Status: Active Ulcer/skin breakdown will have a volume reduction of 50% by week 8 Date Initiated: 01/13/2016 Goal Status: Active Ulcer/skin breakdown will have a volume reduction of 80% by week 12 Date Initiated: 01/13/2016 Goal Status: Active Ulcer/skin breakdown will heal within 14 weeks Date Initiated: 01/13/2016 Goal Status: Active Interventions: Assess patient/caregiver ability to obtain necessary supplies Assess patient/caregiver ability to perform ulcer/skin care regimen upon admission and as needed Assess ulceration(s) every visit Provide education on ulcer and skin care Notes: Electronic Signature(s) Signed: 03/02/2016 4:46:05 PM By: Gretta Cool, RN, BSN, Kim RN, BSN Entered By: Gretta Cool, RN, BSN, Kim on 03/02/2016 13:27:26 Andrew [name] ([id number]) -------------------------------------------------------------------------------- Pain Assessment Details Patient Name: Andrew [name] Date of Service: 03/02/2016 1:30 PM Medical Record Number: [id number] Patient Account Number:  [id number] Date of Birth/Sex: 03/24/1923 (82 y.o. Male) Treating RN: Cornell Barman Primary Care Physician: Ramonita Lab Other Clinician: Referring Physician: Ramonita Lab Treating Physician/Extender: Frann Rider in Treatment: 7 Active Problems Location of Pain Severity and Description of Pain Patient Has Paino No Site Locations With Dressing Change: No Pain Management and Medication Current Pain Management: Electronic Signature(s) Signed: 03/02/2016 4:46:05  PM By: Gretta Cool, RN, BSN, Kim RN, BSN Entered By: Gretta Cool, RN, BSN, Kim on 03/02/2016 13:20:51 Andrew [name] ([id number]) -------------------------------------------------------------------------------- Patient/Caregiver Education Details Patient Name: Andrew [name] Date of Service: 03/02/2016 1:30 PM Medical Record Number: [id number] Patient Account Number: [id number] Date of Birth/Gender: 1922/08/11 (82 y.o. Male) Treating RN: Baruch Gouty, RN, BSN, [place] Primary Care Physician: Ramonita Lab Other Clinician: Referring Physician: Ramonita Lab Treating Physician/Extender: Frann Rider in Treatment: 7 Education Assessment Education Provided To: Patient Education Topics Provided Wound/Skin Impairment: Handouts: Caring for Your Ulcer, Other: wound care as prescribed Methods: Demonstration, Explain/Verbal Responses: State content correctly Electronic Signature(s) Signed: 03/02/2016 5:32:33 PM By: Regan Lemming BSN, RN Entered By: Regan Lemming on 03/02/2016 13:47:18 Andrew [name] ([id number]) -------------------------------------------------------------------------------- Wound Assessment Details Patient Name: Andrew [name] Date of Service: 03/02/2016 1:30 PM Medical Record Number: [id number] Patient Account Number: [id number] Date of Birth/Sex: 1923/06/01 (82 y.o. Male) Treating RN: Cornell Barman Primary Care Physician: Ramonita Lab Other Clinician: Referring Physician: Ramonita Lab Treating Physician/Extender: Frann Rider in Treatment: 7 Wound Status Wound Number: 1 Primary Arterial Insufficiency Ulcer Etiology: Wound Location: Right Lower Leg - Medial Secondary Venous Leg Ulcer Wounding Event: Gradually Appeared Etiology: Date Acquired: 12/14/2015 Wound Open Weeks Of Treatment: 7 Status: Clustered Wound: No Comorbid Cataracts, Anemia, Chronic Obstructive History: Pulmonary Disease (COPD), Arrhythmia, Hypertension, Peripheral Venous Disease,  Osteoarthritis Photos Wound Measurements Length: (cm) 2.5 Width: (cm) 4 Depth: (cm) 0.2 Area: (cm) 7.854 Volume: (cm) 1.571 % Reduction in Area: -150% % Reduction in Volume: -150.2% Epithelialization: None Tunneling: No Undermining: No Wound Description Full Thickness Without Exposed Foul Odor Afte Classification: Support Structures Wound Margin: Distinct, outline attached Exudate Small Amount: Exudate Type: Serous Exudate Color: amber r Cleansing: No Wound Bed Gainer, Jeromiah D. ([id number]) Granulation Amount: Small (1-33%) Exposed Structure Granulation Quality: Pale Fascia Exposed: No Necrotic Amount: Large (67-100%) Fat Layer Exposed: No Necrotic Quality: Adherent Slough Tendon Exposed: No Muscle Exposed: No Joint Exposed: No Bone Exposed: No Limited to Skin Breakdown Periwound Skin Texture Texture Color No Abnormalities Noted: No No Abnormalities Noted: No Callus: No Atrophie Blanche: No Crepitus: No Cyanosis: No Excoriation: No Ecchymosis: No Fluctuance: No Erythema: Yes Friable: No Hemosiderin Staining: No Induration: No Mottled: No Localized Edema: Yes Pallor: No Rash: No Rubor: No Scarring: No Temperature / Pain Moisture Temperature: No Abnormality No Abnormalities Noted: No Dry / Scaly: No Maceration: No Moist: Yes Wound Preparation Ulcer Cleansing: Rinsed/Irrigated with Saline Topical Anesthetic Applied: Other: lidocaine 4%, Treatment Notes Wound #1 (Right, Medial Lower Leg) 1. Cleansed with: Clean wound with Normal Saline 2. Anesthetic Topical Lidocaine 4% cream to wound bed prior to debridement 4. Dressing Applied: Medihoney Gel 5. [place] Signature(s) Signed: 03/02/2016 4:46:05 PM By: Gretta Cool, RN, BSN, Kim RN, BSN Entered By: Gretta Cool, RN, BSN, Kim on 03/02/2016 13:26:34 BRYKEN, NEWEY ([id number]) ANDREAZ, KALMBACH  ([id number]) -------------------------------------------------------------------------------- Vitals Details Patient Name: Andrew [name] Date of Service: 03/02/2016 1:30 PM Medical Record Number: [id number] Patient Account Number: [id number] Date of Birth/Sex: [birth year] (82 y.o. Male) Treating RN: Cornell Barman Primary Care Physician: Ramonita Lab Other Clinician: Referring  Physician: Ramonita Lab Treating Physician/Extender: Frann Rider in Treatment: 7 Vital Signs Time Taken: 13:20 Temperature (F): 97.9 Height (in): 64 Pulse (bpm): 103 Weight (lbs): 195 Respiratory Rate (breaths/min): 18 Body Mass Index (BMI): 33.5 Blood Pressure (mmHg): 134/68 Reference Range: 80 - 120 mg / dl Electronic Signature(s) Signed: 03/02/2016 4:46:05 PM By: Gretta Cool, RN, BSN, Kim RN, BSN Entered By: Gretta Cool, RN, BSN, Kim on 03/02/2016 13:21:11

## 2016-03-10 ENCOUNTER — Encounter: Payer: Medicare Other | Attending: Surgery | Admitting: Surgery

## 2016-03-10 ENCOUNTER — Ambulatory Visit (INDEPENDENT_AMBULATORY_CARE_PROVIDER_SITE_OTHER): Payer: Medicare Other | Admitting: Podiatry

## 2016-03-10 ENCOUNTER — Encounter: Payer: Self-pay | Admitting: Podiatry

## 2016-03-10 DIAGNOSIS — I83212 Varicose veins of right lower extremity with both ulcer of calf and inflammation: Secondary | ICD-10-CM | POA: Insufficient documentation

## 2016-03-10 DIAGNOSIS — L84 Corns and callosities: Secondary | ICD-10-CM | POA: Diagnosis not present

## 2016-03-10 DIAGNOSIS — M7989 Other specified soft tissue disorders: Secondary | ICD-10-CM

## 2016-03-10 DIAGNOSIS — D649 Anemia, unspecified: Secondary | ICD-10-CM | POA: Insufficient documentation

## 2016-03-10 DIAGNOSIS — M79669 Pain in unspecified lower leg: Secondary | ICD-10-CM

## 2016-03-10 DIAGNOSIS — N183 Chronic kidney disease, stage 3 (moderate): Secondary | ICD-10-CM | POA: Diagnosis not present

## 2016-03-10 DIAGNOSIS — B351 Tinea unguium: Secondary | ICD-10-CM

## 2016-03-10 DIAGNOSIS — M79676 Pain in unspecified toe(s): Secondary | ICD-10-CM

## 2016-03-10 DIAGNOSIS — I872 Venous insufficiency (chronic) (peripheral): Secondary | ICD-10-CM

## 2016-03-10 DIAGNOSIS — Z87891 Personal history of nicotine dependence: Secondary | ICD-10-CM | POA: Diagnosis not present

## 2016-03-10 DIAGNOSIS — J449 Chronic obstructive pulmonary disease, unspecified: Secondary | ICD-10-CM | POA: Diagnosis not present

## 2016-03-10 DIAGNOSIS — I70232 Atherosclerosis of native arteries of right leg with ulceration of calf: Secondary | ICD-10-CM | POA: Insufficient documentation

## 2016-03-10 DIAGNOSIS — I890 Lymphedema, not elsewhere classified: Secondary | ICD-10-CM | POA: Insufficient documentation

## 2016-03-10 DIAGNOSIS — L97211 Non-pressure chronic ulcer of right calf limited to breakdown of skin: Secondary | ICD-10-CM | POA: Diagnosis not present

## 2016-03-10 DIAGNOSIS — I129 Hypertensive chronic kidney disease with stage 1 through stage 4 chronic kidney disease, or unspecified chronic kidney disease: Secondary | ICD-10-CM | POA: Diagnosis not present

## 2016-03-10 NOTE — Progress Notes (Signed)
SUBJECTIVE Patient presents to office today complaining of elongated, thickened nails. Pain while ambulating in shoes. Patient is unable to trim their own nails. Patient also has painful hyperkeratotic lesions sub-first MPJs bilaterally. He states that they are painful when he walks.  No Known Allergies  OBJECTIVE General Patient is awake, alert, and oriented x 3 and in no acute distress. Derm hyperkeratotic callus lesion noted to the subcutaneous first MPJs bilaterally. Skin is dry and supple bilateral. Negative open lesions or macerations. Remaining integument unremarkable. Nails are tender, long, thickened and dystrophic with subungual debris, consistent with onychomycosis, 1-5 bilateral. No signs of infection noted. Vasc  DP and PT pedal pulses palpable bilaterally. Temperature gradient within normal limits.  Neuro Epicritic and protective threshold sensation diminished bilaterally.  Musculoskeletal Exam No symptomatic pedal deformities noted bilateral. Muscular strength within normal limits.  ASSESSMENT 1. Dermatophytosis of nails 1 through 5 bilateral 2. Onychomycosis of nail due to dermatophyte bilateral 3. Pain in foot bilateral 4. Callous formation bilateral sub-first MPJ  PLAN OF CARE Patient evaluated today. Instructed to maintain good pedal hygiene and foot care. Excisional debridement of hyperkeratotic lesions was performed using a chisel blade to the bilateral sub-first MPJs Mechanical debridement of nails 1-5 bilaterally performed using a nail nipper. Filed with dremel without incident.  All patient questions were answered. Return to clinic in 3 mos.    Edrick Kins, DPM

## 2016-03-11 NOTE — Progress Notes (Signed)
KOLBI, SHETTY ([id number]) Visit Report for 03/10/2016 Chief Complaint Document Details Patient Name: Andrew [name], Andrew [name] Date of Service: 03/10/2016 10:45 AM Medical Record Number: [id number] Patient Account Number: [id number] Date of Birth/Sex: 1923/05/03 (82 y.o. Male) Treating RN: Baruch Gouty, RN, BSN, Velva Harman Primary Care Physician: Ramonita Lab Other Clinician: Referring Physician: Ramonita Lab Treating Physician/Extender: Frann Rider in Treatment: 8 Information Obtained from: Patient Chief Complaint Patient presents for treatment of an open ulcer due to venous insufficiency which is had on and off on the right lower extremity for several months Electronic Signature(s) Signed: 03/10/2016 10:57:37 AM By: Christin Fudge MD, FACS Entered By: Christin Fudge on 03/10/2016 10:57:37 Andrew [name] ([id number]) -------------------------------------------------------------------------------- Debridement Details Patient Name: Andrew [name] Date of Service: 03/10/2016 10:45 AM Medical Record Number: [id number] Patient Account Number: [id number] Date of Birth/Sex: 01/10/1923 (82 y.o. Male) Treating RN: Baruch Gouty, RN, BSN, [place] Primary Care Physician: Ramonita Lab Other Clinician: Referring Physician: Ramonita Lab Treating Physician/Extender: Frann Rider in Treatment: 8 Debridement Performed for Wound #1 Right,Medial Lower Leg Assessment: Performed By: Physician Christin Fudge, MD Debridement: Debridement Pre-procedure Yes - 10:50 Verification/Time Out Taken: Start Time: 10:50 Pain Control: Lidocaine 4% Topical Solution Level: Skin/Subcutaneous Tissue Total Area Debrided (L x 2.5 (cm) x 4 (cm) = 10 (cm) W): Tissue and other Non-Viable, Exudate, Fibrin/Slough, Subcutaneous material debrided: Instrument: Curette Bleeding: Minimum Hemostasis Achieved: Pressure End Time: 10:55 Procedural Pain: 0 Post Procedural Pain: 0 Response to Treatment: Procedure was tolerated well Post  Debridement Measurements of Total Wound Length: (cm) 2.5 Width: (cm) 4 Depth: (cm) 0.2 Volume: (cm) 1.571 Character of Wound/Ulcer Post Requires Further Debridement Debridement: Severity of Tissue Post Debridement: Fat layer exposed Post Procedure Diagnosis Same as Pre-procedure Electronic Signature(s) Signed: 03/10/2016 10:57:30 AM By: Christin Fudge MD, FACS Signed: 03/10/2016 2:52:21 PM By: Regan Lemming BSN, RN Entered By: Christin Fudge on 03/10/2016 10:57:29 Andrew [name], Andrew [name] ([id number]) Andrew Richters, Andrew [name] ([id number]) -------------------------------------------------------------------------------- HPI Details Patient Name: Andrew [name] Date of Service: 03/10/2016 10:45 AM Medical Record Number: [id number] Patient Account Number: [id number] Date of Birth/Sex: [birth year] (82 y.o. Male) Treating RN: Baruch Gouty, RN, BSN, [place] Primary Care Physician: Ramonita Lab Other Clinician: Referring Physician: Ramonita Lab Treating Physician/Extender: Frann Rider in Treatment: 8 History of Present Illness Location: right lower extremity ulceration Quality: Patient reports experiencing a dull pain to affected area(s). Severity: Patient states wound are getting worse. Duration: Patient has had the wound for > 3 months prior to seeking treatment at the wound center Timing: Pain in wound is Intermittent (comes and goes Context: The wound appeared gradually over time Modifying Factors: Other treatment(s) tried include:local care with hydrogen peroxide and alcohol Associated Signs and Symptoms: Patient reports having increase swelling. HPI Description: 82 year old gentleman who was seen by his PCP Dr. Kerrin Mo for a ulceration on the right calf which she's had for several months. The patient also has past medical history significant for hypertension, COPD, chronic kidney disease stage III,anemia, bilateral leg edema, status post appendectomy and prostate surgery. he is a former smoker and  has not smoked since 1994 he had a venous duplex study done of the right lower extremity in 2012 which was negative for a deep vein thrombosis. he has not had an arterial study done at anytime as per the medical records 01/27/2016 -- he recently had workup done at [facility] and his lower extremity venous duplex for reflux showed chronic deep vein thrombosis in the left femoral and popliteal veins, no evidence  of lower extremity superficial thrombophlebitis bilaterally and no incompetence of the greater small saphenous veins bilaterally. Lower extremity arterial study showed resting ABI of 0.62 on the right and 1.0 on the left. Dr Lucky Cowboy saw him and recommended a right lower extremity angiogram. 02/10/2016 -- he had his procedure done on 02/07/2016 for a peripheral arterial disease of the right lower extremity and hand percutaneous transluminal angioplasty of the tibioperoneal trunk and proximal peroneal artery, right distal SFA and popliteal arteries and also had a stent placed in the right distal SFA and popliteal artery. 03/01/2016 -- -- was seen by Dr. Weldon Inches office today by Ms. Marcelle Overlie. Is made of the fact that the patient is status post right lower extremity angiogram for arterial disease on 02/07/2016. His most recent ABI shows that on the right it is 0.44 and on the left 0.86. Plan was to see Dr. Lucky Cowboy back in the office and undergo a right lower extremity arterial duplex study to access if the stent is still patent. Electronic Signature(s) Signed: 03/10/2016 10:57:43 AM By: Christin Fudge MD, FACS Entered By: Christin Fudge on 03/10/2016 10:57:43 Andrew [name], Andrew [name] ([id number]) Andrew [name], Andrew [name] ([id number]) -------------------------------------------------------------------------------- Physical Exam Details Patient Name: Andrew [name] Date of Service: 03/10/2016 10:45 AM Medical Record Number: [id number] Patient Account Number: [id number] Date of  Birth/Sex: Mar 31, 1923 (82 y.o. Male) Treating RN: Baruch Gouty, RN, BSN, Velva Harman Primary Care Physician: Ramonita Lab Other Clinician: Referring Physician: Ramonita Lab Treating Physician/Extender: Frann Rider in Treatment: 8 Constitutional . Pulse regular. Respirations normal and unlabored. Afebrile. . Eyes Nonicteric. Reactive to light. Ears, Nose, Mouth, and Throat Lips, teeth, and gums WNL.Marland Kitchen Moist mucosa without lesions. Neck supple and nontender. No palpable supraclavicular or cervical adenopathy. Normal sized without goiter. Respiratory WNL. No retractions.. Cardiovascular Pedal Pulses WNL. No clubbing, cyanosis or edema. Lymphatic No adneopathy. No adenopathy. No adenopathy. Musculoskeletal Adexa without tenderness or enlargement.. Digits and nails w/o clubbing, cyanosis, infection, petechiae, ischemia, or inflammatory conditions.. Integumentary (Hair, Skin) No suspicious lesions. No crepitus or fluctuance. No peri-wound warmth or erythema. No masses.Marland Kitchen Psychiatric Judgement and insight Intact.. No evidence of depression, anxiety, or agitation.. Notes I have sharply remove some of the subcutaneous tissue with a #3 curet and minimal bleeding controlled with pressure. Electronic Signature(s) Signed: 03/10/2016 10:58:13 AM By: Christin Fudge MD, FACS Entered By: Christin Fudge on 03/10/2016 10:58:12 Andrew [name] ([id number]) -------------------------------------------------------------------------------- Physician Orders Details Patient Name: Andrew [name] Date of Service: 03/10/2016 10:45 AM Medical Record Number: [id number] Patient Account Number: [id number] Date of Birth/Sex: [birth year] (82 y.o. Male) Treating RN: Baruch Gouty, RN, BSN, Velva Harman Primary Care Physician: Ramonita Lab Other Clinician: Referring Physician: Ramonita Lab Treating Physician/Extender: Frann Rider in Treatment: 8 Verbal / Phone Orders: Yes Clinician: Afful, RN, BSN, Rita Read Back and Verified:  Yes Diagnosis Coding Wound Cleansing Wound #1 Right,Medial Lower Leg o Cleanse wound with mild soap and water o May Shower, gently pat wound dry prior to applying new dressing. o May shower with protection. Primary Wound Dressing Wound #1 Right,Medial Lower Leg o Medihoney gel Secondary Dressing Wound #1 Right,Medial Lower Leg o Non-adherent pad - Telfa island Dressing Change Frequency Wound #1 Right,Medial Lower Leg o Change dressing every day. Follow-up Appointments Wound #1 Right,Medial Lower Leg o Return Appointment in 1 week. Edema Control Wound #1 Right,Medial Lower Leg o Patient to wear own Juxtalite/Juzo compression garment. o Elevate legs to the level of the heart and pump ankles as often as possible Additional Orders / Instructions Wound #1  Right,Medial Lower Leg o Increase protein intake. o Activity as tolerated Electronic Signature(s) Signed: 03/10/2016 1:43:20 PM By: Christin Fudge MD, FACS Andrew [name], Andrew [name] ([id number]) Signed: 03/10/2016 2:52:21 PM By: Regan Lemming BSN, RN Entered By: Regan Lemming on 03/10/2016 10:54:44 Andrew [name] ([id number]) -------------------------------------------------------------------------------- Problem List Details Patient Name: Andrew [name] Date of Service: 03/10/2016 10:45 AM Medical Record Number: [id number] Patient Account Number: [id number] Date of Birth/Sex: 1922/07/07 (82 y.o. 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 01/20/2016 Yes calf and inflammation I70.232 Atherosclerosis of native  arteries of right leg with 01/27/2016 Yes ulceration of calf Inactive Problems Resolved Problems Electronic Signature(s) Signed: 03/10/2016 10:57:18 AM By: Christin Fudge MD, FACS Entered By: Christin Fudge on 03/10/2016 10:57:18 Andrew [name] ([id number]) -------------------------------------------------------------------------------- Progress Note Details Patient Name: Andrew [name] Date of Service: 03/10/2016 10:45 AM Medical Record Number: [id number] Patient Account Number: [id number] Date of Birth/Sex: 05-Feb-1923 (82 y.o. Male) Treating RN: Baruch Gouty, RN, BSN, [place] Primary Care Physician: Ramonita Lab Other Clinician: Referring Physician: Ramonita Lab Treating Physician/Extender: Frann Rider in Treatment: 8 Subjective Chief Complaint Information obtained from Patient Patient presents for treatment of an open ulcer due to venous insufficiency which is had on and off on the right lower extremity for several months History of Present Illness (HPI) The following HPI elements were documented for the patient's wound: Location: right lower extremity ulceration Quality: Patient reports experiencing a dull pain to affected area(s). Severity: Patient states wound are getting worse. Duration: Patient has had the wound for > 3 months prior to seeking treatment at the wound center Timing: Pain in wound is Intermittent (comes and goes Context: The wound appeared gradually over time Modifying Factors: Other treatment(s) tried include:local care with hydrogen peroxide and alcohol Associated Signs and Symptoms: Patient reports having increase swelling. 82 year old gentleman who was seen by his PCP Dr. Kerrin Mo for a ulceration on the right calf which she's had for several months. The patient also has past medical history significant for hypertension, COPD, chronic kidney disease stage III,anemia, bilateral leg edema, status post appendectomy and prostate surgery. he is a former smoker  and has not smoked since 1994 he had a venous duplex study done of the right lower extremity in 2012 which was negative for a deep vein thrombosis. he has not had an arterial study done at anytime as per the medical records 01/27/2016 -- he recently had workup done at [facility] and his lower extremity venous duplex for reflux showed chronic deep vein thrombosis in the left femoral and popliteal veins, no evidence of lower extremity superficial thrombophlebitis bilaterally and no incompetence of the greater small saphenous veins bilaterally. Lower extremity arterial study showed resting ABI of 0.62 on the right and 1.0 on the left. Dr Lucky Cowboy saw him and recommended a right lower extremity angiogram. 02/10/2016 -- he had his procedure done on 02/07/2016 for a peripheral arterial disease of the right lower extremity and hand percutaneous transluminal angioplasty of the tibioperoneal trunk and proximal peroneal artery, right distal SFA and popliteal arteries and also had a stent placed in the right distal SFA and popliteal artery. 03/01/2016 -- -- was seen by Dr. Lucky Cowboy s office  today by Ms. Marcelle Overlie. Is made of the fact that the patient is status post right lower extremity angiogram for arterial disease on 02/07/2016. His most recent [place] ([id number]) ABI shows that on the right it is 0.44 and on the left 0.86. Plan was to see Dr. Lucky Cowboy back in the office and undergo a right lower extremity arterial duplex study to access if the stent is still patent. Objective Constitutional Pulse regular. Respirations normal and unlabored. Afebrile. Vitals Time Taken: 10:38 AM, Height: 64 in, Weight: 195 lbs, BMI: 33.5, Temperature: 97.4 F, Pulse: 97 bpm, Respiratory Rate: 18 breaths/min, Blood Pressure: 140/83 mmHg. Eyes Nonicteric. Reactive to light. Ears, Nose, Mouth, and Throat Lips, teeth, and gums WNL.Marland Kitchen Moist mucosa without lesions. Neck supple and  nontender. No palpable supraclavicular or cervical adenopathy. Normal sized without goiter. Respiratory WNL. No retractions.. Cardiovascular Pedal Pulses WNL. No clubbing, cyanosis or edema. Lymphatic No adneopathy. No adenopathy. No adenopathy. Musculoskeletal Adexa without tenderness or enlargement.. Digits and nails w/o clubbing, cyanosis, infection, petechiae, ischemia, or inflammatory conditions.Marland Kitchen Psychiatric Judgement and insight Intact.. No evidence of depression, anxiety, or agitation.. General Notes: I have sharply remove some of the subcutaneous tissue with a #3 curet and minimal bleeding controlled with pressure. Integumentary (Hair, Skin) No suspicious lesions. No crepitus or fluctuance. No peri-wound warmth or erythema. No masses.Marland Kitchen Andrew [name], [name] ([id number]) Wound #1 status is Open. Original cause of wound was Gradually Appeared. The wound is located on the Right,Medial Lower Leg. The wound measures 2.5cm length x 4cm width x 0.2cm depth; 7.854cm^2 area and 1.571cm^3 volume. The wound is limited to skin breakdown. There is no tunneling or undermining noted. There is a medium amount of serous drainage noted. The wound margin is distinct with the outline attached to the wound base. There is small (1-33%) pale granulation within the wound bed. There is a large (67-100%) amount of necrotic tissue within the wound bed including Adherent Slough. The periwound skin appearance exhibited: Localized Edema, Moist, Hemosiderin Staining, Erythema. The periwound skin appearance did not exhibit: Callus, Crepitus, Excoriation, Fluctuance, Friable, Induration, Rash, Scarring, Dry/Scaly, Maceration, Atrophie Blanche, Cyanosis, Ecchymosis, Mottled, Pallor, Rubor. The surrounding wound skin color is noted with erythema. Periwound temperature was noted as No Abnormality. The periwound has tenderness on palpation. Assessment Active Problems ICD-10 I89.0 - Lymphedema, not elsewhere  classified I73.9 - Peripheral vascular disease, unspecified I83.212 - Varicose veins of right lower extremity with both ulcer of calf and inflammation I70.232 - Atherosclerosis of native arteries of right leg with ulceration of calf Procedures Wound #1 Wound #1 is an Arterial Insufficiency Ulcer located on the Right,Medial Lower Leg . There was a Skin/Subcutaneous Tissue Debridement [id number]) debridement with total area of 10 sq cm performed by Christin Fudge, MD. with the following instrument(s): Curette to remove Non-Viable tissue/material including Exudate, Fibrin/Slough, and Subcutaneous after achieving pain control using Lidocaine 4% Topical Solution. A time out was conducted at 10:50, prior to the start of the procedure. A Minimum amount of bleeding was controlled with Pressure. The procedure was tolerated well with a pain level of 0 throughout and a pain level of 0 following the procedure. Post Debridement Measurements: 2.5cm length x 4cm width x 0.2cm depth; 1.571cm^3 volume. Character of Wound/Ulcer Post Debridement requires further debridement. Severity of Tissue Post Debridement is: Fat layer exposed. Post procedure Diagnosis Wound #1: Same as Pre-Procedure Andrew [name], Andrew [name] ([id number]) Plan Wound Cleansing: Wound #1 Right,Medial Lower Leg: Cleanse wound with mild soap and water May Shower, gently  pat wound dry prior to applying new dressing. May shower with protection. Primary Wound Dressing: Wound #1 Right,Medial Lower Leg: Medihoney gel Secondary Dressing: Wound #1 Right,Medial Lower Leg: Non-adherent pad - Telfa island Dressing Change Frequency: Wound #1 Right,Medial Lower Leg: Change dressing every day. Follow-up Appointments: Wound #1 Right,Medial Lower Leg: Return Appointment in 1 week. Edema Control: Wound #1 Right,Medial Lower Leg: Patient to wear own Juxtalite/Juzo compression garment. Elevate legs to the level of the heart and pump ankles as often as  possible Additional Orders / Instructions: Wound #1 Right,Medial Lower Leg: Increase protein intake. Activity as tolerated We will continue to treat him with: 1. medihoney to be applied to the wound and apply light Kerlix dressing over this 2. Elevation and exercise 3. regular visits the wound center 4. I have recommended that he see the vascular office to schedule his follow up as planned -- the appointment is next week Electronic Signature(s) Signed: 03/10/2016 10:58:43 AM By: Christin Fudge MD, FACS Entered By: Christin Fudge on 03/10/2016 10:58:43 Andrew [name] ([id number]) -------------------------------------------------------------------------------- [place] Details Patient Name: Andrew [name] Date of Service: 03/10/2016 Medical Record Number: [id number] Patient Account Number: [id number] Date of Birth/Sex: 1922/12/16 (82 y.o. 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 of calf and inflammation I70.232 Atherosclerosis of native arteries of right leg with ulceration of calf Facility Procedures CPT4: Description Modifier Quantity Code [id number] - DEB SUBQ TISSUE 20 SQ CM/< 1 ICD-10 Description Diagnosis I89.0 Lymphedema, not elsewhere classified I73.9 Peripheral vascular disease, unspecified I83.212 Varicose veins of right lower extremity  with both ulcer of calf and inflammation I70.232 Atherosclerosis of native arteries of right leg with ulceration of calf Physician Procedures CPT4: Description Modifier Quantity Code [id number] - WC PHYS SUBQ TISS 20 SQ CM 1 ICD-10 Description Diagnosis I89.0 Lymphedema, not elsewhere  classified I73.9 Peripheral vascular disease, unspecified I83.212 Varicose veins of right lower extremity  with both ulcer of calf and inflammation I70.232 Atherosclerosis of native arteries of right leg with ulceration of calf Electronic Signature(s) Signed: 03/10/2016 10:58:53 AM By: Christin Fudge MD, FACS Andrew [name], [name] ([id number]) Entered By: Christin Fudge on 03/10/2016 10:58:53

## 2016-03-11 NOTE — Progress Notes (Signed)
Andrew, [name] ([id number]) Visit Report for 03/10/2016 Arrival Information Details Patient Name: Andrew [name], Andrew [name] Date of Service: 03/10/2016 10:45 AM Medical Record Number: [id number] Patient Account Number: [id number] Date of Birth/Sex: 1923-01-29 (82 y.o. Male) Treating RN: Andrew Gouty, RN, BSN, Andrew [name] Primary Care Physician: Andrew [name] Other Clinician: Referring Physician: Ramonita [name] Treating Physician/Extender: Andrew [name] in Treatment: 8 Visit Information History Since Last Visit All ordered tests and consults were completed: No Patient Arrived: Andrew [name] Added or deleted any medications: No Arrival Time: 10:36 Any new allergies or adverse reactions: No Accompanied By: self Signs or symptoms of abuse/neglect since last No Transfer Assistance: None visito Patient Identification Verified: Yes Has Dressing in Place as Prescribed: Yes Secondary Verification Process Yes Pain Present Now: No Completed: Patient Requires Transmission- No Based Precautions: Patient Has Alerts: Yes Patient Alerts: ABI Non Compressible Electronic Signature(s) Signed: 03/10/2016 2:52:21 PM By: Andrew [name] BSN, RN Entered By: Andrew [name] on 03/10/2016 10:37:06 Andrew [name] ([id number]) -------------------------------------------------------------------------------- Encounter Discharge Information Details Patient Name: Andrew [name] Date of Service: 03/10/2016 10:45 AM Medical Record Number: [id number] Patient Account Number: [id number] Date of Birth/Sex: Dec 27, 1922 (82 y.o. Male) Treating RN: Andrew Gouty, RN, BSN, Andrew [name] Primary Care Physician: Andrew [name] Other Clinician: Referring Physician: Ramonita [name] Treating Physician/Extender: Andrew [name] in Treatment: 8 Encounter Discharge Information Items Discharge Pain Level: 0 Discharge Condition: Stable Ambulatory Status: Cane Discharge Destination: Home Transportation: Private Auto Accompanied By: self Schedule Follow-up Appointment:  No Medication Reconciliation completed No and provided to Patient/Care Andrew [name]: Provided on Clinical Summary of Care: 03/10/2016 Form Type Recipient Paper Patient CB Electronic Signature(s) Signed: 03/10/2016 11:02:56 AM By: Andrew [name] Entered By: Andrew [name] on 03/10/2016 11:02:56 Andrew [name] ([id number]) -------------------------------------------------------------------------------- Lower Extremity Assessment Details Patient Name: Andrew [name] Date of Service: 03/10/2016 10:45 AM Medical Record Number: [id number] Patient Account Number: [id number] Date of Birth/Sex: 01/03/1923 (82 y.o. Male) Treating RN: Andrew Gouty, RN, BSN, Andrew [name] Primary Care Physician: Andrew [name] Other Clinician: Referring Physician: Ramonita [name] Treating Physician/Extender: Andrew [name] in Treatment: 8 Edema Assessment Assessed: [Left: No] [Right: No] Edema: [Left: Ye] [Right: s] Calf Left: Right: Point of Measurement: 34 cm From Medial Instep cm 35.4 cm Ankle Left: Right: Point of Measurement: 13 cm From Medial Instep cm 23.3 cm Vascular Assessment Claudication: Claudication Assessment [Right:None] Pulses: Posterior Tibial Dorsalis Pedis Palpable: [Right:Yes] Extremity colors, hair growth, and conditions: Extremity Color: [Right:Mottled] Hair Growth on Extremity: [Right:Yes] Temperature of Extremity: [Right:Warm] Capillary Refill: [Right:< 3 seconds] Toe Nail Assessment Left: Right: Thick: Yes Discolored: Yes Deformed: Yes Improper Length and Hygiene: Yes Electronic Signature(s) Signed: 03/10/2016 2:52:21 PM By: Andrew [name] BSN, RN Entered By: Andrew [name] on 03/10/2016 10:42:39 Andrew [name], Andrew [name] ([id number]) Andrew [name], Andrew [name] [place] Kitchen ([id number]) -------------------------------------------------------------------------------- Multi Wound Chart Details Patient Name: Andrew [name] Date of Service: 03/10/2016 10:45 AM Medical Record Number: [id number] Patient Account Number:  [id number] Date of Birth/Sex: 1922-08-11 (82 y.o. Male) Treating RN: Andrew Gouty, RN, BSN, [place] Primary Care Physician: Andrew [name] Other Clinician: Referring Physician: Ramonita [name] Treating Physician/Extender: Andrew [name] in Treatment: 8 Vital Signs Height(in): 64 Pulse(bpm): 97 Weight(lbs): 195 Blood Pressure 140/83 (mmHg): Body Mass Index(BMI): 33 Temperature(F): 97.4 Respiratory Rate 18 (breaths/min): Photos: [1:No Photos] [N/A:N/A] Wound Location: [1:Right Lower Leg - Medial N/A] Wounding Event: [1:Gradually Appeared] [N/A:N/A] Primary Etiology: [1:Arterial Insufficiency Ulcer N/A] Secondary Etiology: [1:Venous Leg Ulcer] [N/A:N/A] Comorbid History: [1:Cataracts, Anemia, Chronic Obstructive Pulmonary Disease (COPD), Arrhythmia, Hypertension, Peripheral Venous Disease, Osteoarthritis] [N/A:N/A] Date Acquired: [1:12/14/2015] [N/A:N/A] Weeks of Treatment: [1:8] [N/A:N/A]  Wound Status: [1:Open] [N/A:N/A] Measurements L x W x D 2.5x4x0.2 [N/A:N/A] (cm) Area (cm) : [1:7.854] [N/A:N/A] Volume (cm) : [1:1.571] [N/A:N/A] % Reduction in Area: [1:-150.00%] [N/A:N/A] % Reduction in Volume: -150.20% [N/A:N/A] Classification: [1:Full Thickness Without Exposed Support Structures] [N/A:N/A] Exudate Amount: [1:Medium] [N/A:N/A] Exudate Type: [1:Serous] [N/A:N/A] Exudate Color: [1:amber] [N/A:N/A] Wound Margin: [1:Distinct, outline attached N/A] Granulation Amount: [1:Small (1-33%)] [N/A:N/A] Granulation Quality: [1:Pale] [N/A:N/A] Necrotic Amount: Large (67-100%) N/A N/A Exposed Structures: Fascia: No N/A N/A Fat: No Tendon: No Muscle: No Joint: No Bone: No Limited to Skin Breakdown Epithelialization: None N/A N/A Periwound Skin Texture: Edema: Yes N/A N/A Excoriation: No Induration: No Callus: No Crepitus: No Fluctuance: No Friable: No Rash: No Scarring: No Periwound Skin Moist: Yes N/A N/A Moisture: Maceration: No Dry/Scaly: No Periwound Skin Color:  Erythema: Yes N/A N/A Hemosiderin Staining: Yes Atrophie Blanche: No Cyanosis: No Ecchymosis: No Mottled: No Pallor: No Rubor: No Temperature: No Abnormality N/A N/A Tenderness on Yes N/A N/A Palpation: Wound Preparation: Ulcer Cleansing: N/A N/A Rinsed/Irrigated with Saline Topical Anesthetic Applied: Other: lidocaine 4% Treatment Notes Electronic Signature(s) Signed: 03/10/2016 2:52:21 PM By: Andrew [name] BSN, RN Entered By: Andrew [name] on 03/10/2016 10:44:59 Andrew [name] ([id number]) -------------------------------------------------------------------------------- Multi-Disciplinary Care Plan Details Patient Name: Andrew [name] Date of Service: 03/10/2016 10:45 AM Medical Record Number: [id number] Patient Account Number: [id number] Date of Birth/Sex: 02-15-1923 (82 y.o. Male) Treating RN: Andrew Gouty, RN, BSN, Andrew [name] Primary Care Physician: Andrew [name] Other Clinician: Referring Physician: Ramonita [name] Treating Physician/Extender: Andrew [name] in Treatment: 8 Active Inactive Venous Leg Ulcer Nursing Diagnoses: Knowledge deficit related to disease process and management Potential for venous Insuffiency (use before diagnosis confirmed) Goals: Non-invasive venous studies are completed as ordered Date Initiated: 01/13/2016 Goal Status: Active Patient will maintain optimal edema control Date Initiated: 01/13/2016 Goal Status: Active Patient/caregiver will verbalize understanding of disease process and disease management Date Initiated: 01/13/2016 Goal Status: Active Verify adequate tissue perfusion prior to therapeutic compression application Date Initiated: 01/13/2016 Goal Status: Active Interventions: Assess peripheral edema status every visit. Compression as ordered Provide education on venous insufficiency Notes: Wound/Skin Impairment Nursing Diagnoses: Impaired tissue integrity Knowledge deficit related to ulceration/compromised skin  integrity Goals: Patient/caregiver will verbalize understanding of skin care regimen Date Initiated: 01/13/2016 Andrew [name] ([id number]) Goal Status: Active Ulcer/skin breakdown will have a volume reduction of 30% by week 4 Date Initiated: 01/13/2016 Goal Status: Active Ulcer/skin breakdown will have a volume reduction of 50% by week 8 Date Initiated: 01/13/2016 Goal Status: Active Ulcer/skin breakdown will have a volume reduction of 80% by week 12 Date Initiated: 01/13/2016 Goal Status: Active Ulcer/skin breakdown will heal within 14 weeks Date Initiated: 01/13/2016 Goal Status: Active Interventions: Assess patient/caregiver ability to obtain necessary supplies Assess patient/caregiver ability to perform ulcer/skin care regimen upon admission and as needed Assess ulceration(s) every visit Provide education on ulcer and skin care Notes: Electronic Signature(s) Signed: 03/10/2016 2:52:21 PM By: Andrew [name] BSN, RN Entered By: Andrew [name] on 03/10/2016 10:44:50 Andrew [name] ([id number]) -------------------------------------------------------------------------------- Pain Assessment Details Patient Name: Andrew [name] Date of Service: 03/10/2016 10:45 AM Medical Record Number: [id number] Patient Account Number: [id number] Date of Birth/Sex: 03-19-1923 (82 y.o. Male) Treating RN: Andrew Gouty, RN, BSN, Andrew [name] Primary Care Physician: Andrew [name] Other Clinician: Referring Physician: Ramonita [name] Treating Physician/Extender: Andrew [name] in Treatment: 8 Active Problems Location of Pain Severity and Description of Pain Patient Has Paino No Site Locations With Dressing Change: No Pain Management and Medication Current Pain Management: Electronic Signature(s)  Signed: 03/10/2016 2:52:21 PM By: Andrew [name] BSN, RN Entered By: Andrew [name] on 03/10/2016 10:38:31 Andrew [name]  ([id number]) -------------------------------------------------------------------------------- Patient/Caregiver Education Details Patient Name: Andrew [name] Date of Service: 03/10/2016 10:45 AM Medical Record Number: [id number] Patient Account Number: [id number] Date of Birth/Gender: [birth year] (82 y.o. Male) Treating RN: Andrew Gouty, RN, BSN, Andrew [name] Primary Care Physician: Andrew [name] Other Clinician: Referring Physician: Ramonita [name] Treating Physician/Extender: Andrew [name] in Treatment: 8 Education Assessment Education Provided To: Patient Education Topics Provided Venous: Methods: Explain/Verbal Responses: State content correctly Wound/Skin Impairment: Methods: Explain/Verbal Responses: State content correctly Electronic Signature(s) Signed: 03/10/2016 2:52:21 PM By: Andrew [name] BSN, RN Entered By: Andrew [name] on 03/10/2016 10:56:09 Andrew [name] ([id number]) -------------------------------------------------------------------------------- Wound Assessment Details Patient Name: Andrew [name] Date of Service: 03/10/2016 10:45 AM Medical Record Number: [id number] Patient Account Number: [id number] Date of Birth/Sex: 1923/01/09 (82 y.o. Male) Treating RN: Andrew Gouty, RN, BSN, Andrew [name] Primary Care Physician: Andrew [name] Other Clinician: Referring Physician: Ramonita [name] Treating Physician/Extender: Andrew [name] in Treatment: 8 Wound Status Wound Number: 1 Primary Arterial Insufficiency Ulcer Etiology: Wound Location: Right Lower Leg - Medial Secondary Venous Leg Ulcer Wounding Event: Gradually Appeared Etiology: Date Acquired: 12/14/2015 Wound Open Weeks Of Treatment: 8 Status: Clustered Wound: No Comorbid Cataracts, Anemia, Chronic Obstructive History: Pulmonary Disease (COPD), Arrhythmia, Hypertension, Peripheral Venous Disease, Osteoarthritis Photos Photo Uploaded By: Andrew [name] on 03/10/2016 14:50:55 Wound Measurements Length: (cm) 2.5 Width: (cm)  4 Depth: (cm) 0.2 Area: (cm) 7.854 Volume: (cm) 1.571 % Reduction in Area: -150% % Reduction in Volume: -150.2% Epithelialization: None Tunneling: No Undermining: No Wound Description Full Thickness Without Exposed Foul Odor Aft Classification: Support Structures Wound Margin: Distinct, outline attached Exudate Medium Amount: Exudate Type: Serous Exudate Color: amber Andrew [name], Andrew [name] ([id number]) er Cleansing: No Wound Bed Granulation Amount: Small (1-33%) Exposed Structure Granulation Quality: Pale Fascia Exposed: No Necrotic Amount: Large (67-100%) Fat Layer Exposed: No Necrotic Quality: Adherent Slough Tendon Exposed: No Muscle Exposed: No Joint Exposed: No Bone Exposed: No Limited to Skin Breakdown Periwound Skin Texture Texture Color No Abnormalities Noted: No No Abnormalities Noted: No Callus: No Atrophie Blanche: No Crepitus: No Cyanosis: No Excoriation: No Ecchymosis: No Fluctuance: No Erythema: Yes Friable: No Hemosiderin Staining: Yes Induration: No Mottled: No Localized Edema: Yes Pallor: No Rash: No Rubor: No Scarring: No Temperature / Pain Moisture Temperature: No Abnormality No Abnormalities Noted: No Tenderness on Palpation: Yes Dry / Scaly: No Maceration: No Moist: Yes Wound Preparation Ulcer Cleansing: Rinsed/Irrigated with Saline Topical Anesthetic Applied: Other: lidocaine 4%, Treatment Notes Wound #1 (Right, Medial Lower Leg) 1. Cleansed with: Clean wound with Normal Saline 4. Dressing Applied: Medihoney Gel 5. Secondary Dressing Applied Dry [place] 7. Secured with Patient to wear own compression stockings Electronic Signature(s) Signed: 03/10/2016 2:52:21 PM By: Andrew [name] BSN, RN [place], Madox D. ([id number]) Entered By: Andrew [name] on 03/10/2016 10:44:43 Andrew [name] ([id number]) -------------------------------------------------------------------------------- Vitals Details Patient Name:  Andrew [name] Date of Service: 03/10/2016 10:45 AM Medical Record Number: [id number] Patient Account Number: [id number] Date of Birth/Sex: 06/06/1923 (82 y.o. Male) Treating RN: [place], RN, BSN, [place] Primary Care Physician: Andrew [name] Other Clinician: Referring Physician: Ramonita [name] Treating Physician/Extender: Andrew [name] in Treatment: 8 Vital Signs Time Taken: 10:38 Temperature (F): 97.4 Height (in): 64 Pulse (bpm): 97 Weight (lbs): 195 Respiratory Rate (breaths/min): 18 Body Mass Index (BMI): 33.5 Blood Pressure (mmHg): 140/83 Reference Range: 80 - 120 mg / dl Electronic Signature(s) Signed: 03/10/2016 2:52:21 PM By: Andrew [name],  Andrew [name] BSN, RN Entered By: Andrew [name] on 03/10/2016 10:39:05

## 2016-03-16 ENCOUNTER — Encounter: Payer: Medicare Other | Admitting: Surgery

## 2016-03-16 DIAGNOSIS — I83212 Varicose veins of right lower extremity with both ulcer of calf and inflammation: Secondary | ICD-10-CM | POA: Diagnosis not present

## 2016-03-17 ENCOUNTER — Encounter (INDEPENDENT_AMBULATORY_CARE_PROVIDER_SITE_OTHER): Payer: Self-pay

## 2016-03-17 NOTE — Progress Notes (Signed)
GORO, SHOWELL ([id number]) Visit Report for 03/16/2016 Arrival Information Details Patient Name: Andrew [name], Andrew [name] Date of Service: 03/16/2016 3:45 PM Medical Record Number: [id number] Patient Account Number: [id number] Date of Birth/Sex: [birth year] (82 y.o. Male) Treating RN: Cornell Barman Primary Care Physician: Ramonita Lab Other Clinician: Referring Physician: Ramonita Lab Treating Physician/Extender: Frann Rider in Treatment: 9 Visit Information History Since Last Visit Added or deleted any medications: No Patient Arrived: Andrew [name] Any new allergies or adverse reactions: No Arrival Time: 15:32 Had a fall or experienced change in No Accompanied By: self activities of daily living that may affect Transfer Assistance: None risk of falls: Patient Identification Verified: Yes Signs or symptoms of abuse/neglect since last No Secondary Verification Process Yes visito Completed: Hospitalized since last visit: No Patient Requires Transmission- No Has Dressing in Place as Prescribed: Yes Based Precautions: Pain Present Now: No Patient Has Alerts: Yes Patient Alerts: ABI Non Compressible Electronic Signature(s) Signed: 03/16/2016 4:36:12 PM By: Gretta Cool, RN, BSN, Kim RN, BSN Entered By: Gretta Cool, RN, BSN, Kim on 03/16/2016 15:33:01 Andrew [name] ([id number]) -------------------------------------------------------------------------------- Encounter Discharge Information Details Patient Name: Andrew [name] Date of Service: 03/16/2016 3:45 PM Medical Record Number: [id number] Patient Account Number: [id number] Date of Birth/Sex: 10/22/1922 (82 y.o. Male) Treating RN: Baruch Gouty, RN, BSN, Velva Harman Primary Care Physician: Ramonita Lab Other Clinician: Referring Physician: Ramonita Lab Treating Physician/Extender: Frann Rider in Treatment: 9 Encounter Discharge Information Items Discharge Pain Level: 1 Discharge Condition: Stable Ambulatory Status: Cane Discharge Destination:  Home Transportation: Private Auto Schedule Follow-up Appointment: Yes Medication Reconciliation completed Yes and provided to Patient/Care Myrla Malanowski: Provided on Clinical Summary of Care: 03/16/2016 Form Type Recipient Paper Patient CB Electronic Signature(s) Signed: 03/16/2016 4:36:12 PM By: Gretta Cool, RN, BSN, Kim RN, BSN Previous Signature: 03/16/2016 3:58:14 PM Version By: Ruthine Dose Entered By: Gretta Cool RN, BSN, Kim on 03/16/2016 16:12:50 Andrew [name] ([id number]) -------------------------------------------------------------------------------- Lower Extremity Assessment Details Patient Name: Andrew [name] Date of Service: 03/16/2016 3:45 PM Medical Record Number: [id number] Patient Account Number: [id number] Date of Birth/Sex: 10-13-1922 (82 y.o. Male) Treating RN: Cornell Barman Primary Care Physician: Ramonita Lab Other Clinician: Referring Physician: Ramonita Lab Treating Physician/Extender: Frann Rider in Treatment: 9 Edema Assessment Assessed: [Left: No] [Right: No] E[Left: dema] [Right: :] Calf Left: Right: Point of Measurement: 34 cm From Medial Instep cm 31.5 cm Ankle Left: Right: Point of Measurement: 13 cm From Medial Instep cm 19 cm Vascular Assessment Pulses: Posterior Tibial Dorsalis Pedis Palpable: [Right:Yes] Extremity colors, hair growth, and conditions: Extremity Color: [Right:Normal] Hair Growth on Extremity: [Right:No] Temperature of Extremity: [Right:Warm] Capillary Refill: [Right:< 3 seconds] Dependent Rubor: [Right:No] Blanched when Elevated: [Right:No] Lipodermatosclerosis: [Right:No] Toe Nail Assessment Left: Right: Thick: Yes Discolored: Yes Deformed: Yes Improper Length and Hygiene: No Electronic Signature(s) Signed: 03/16/2016 4:36:12 PM By: Gretta Cool, RN, BSN, Kim RN, BSN Andrew [name], [name] ([id number]) Entered By: Gretta Cool, RN, BSN, Kim on 03/16/2016 15:37:36 Andrew [name]  ([id number]) -------------------------------------------------------------------------------- Multi Wound Chart Details Patient Name: Andrew [name] Date of Service: 03/16/2016 3:45 PM Medical Record Number: [id number] Patient Account Number: [id number] Date of Birth/Sex: 09/04/1922 (82 y.o. Male) Treating RN: Baruch Gouty, RN, BSN, Riva Primary Care Physician: Ramonita Lab Other Clinician: Referring Physician: Ramonita Lab Treating Physician/Extender: Frann Rider in Treatment: 9 Vital Signs Height(in): 64 Pulse(bpm): 98 Weight(lbs): 195 Blood Pressure 151/80 (mmHg): Body Mass Index(BMI): 33 Temperature(F): 97.5 Respiratory Rate 16 (breaths/min): Photos: [N/A:N/A] Wound Location: Right Lower Leg - Medial N/A N/A Wounding Event: Gradually Appeared N/A N/A Primary Etiology:  Arterial Insufficiency Ulcer N/A N/A Secondary Etiology: Venous Leg Ulcer N/A N/A Comorbid History: Cataracts, Anemia, N/A N/A Chronic Obstructive Pulmonary Disease (COPD), Arrhythmia, Hypertension, Peripheral Venous Disease, Osteoarthritis Date Acquired: 12/14/2015 N/A N/A Weeks of Treatment: 9 N/A N/A Wound Status: Open N/A N/A Measurements L x W x D 2.5x4x0.2 N/A N/A (cm) Area (cm) : 7.854 N/A N/A Volume (cm) : 1.571 N/A N/A % Reduction in Area: -150.00% N/A N/A % Reduction in Volume: -150.20% N/A N/A Classification: Full Thickness Without N/A N/A Exposed Support Structures Exudate Amount: Medium N/A N/A Exudate Type: Serous N/A N/A MAASAI, TURTURRO ([id number]) Exudate Color: amber N/A N/A Wound Margin: Distinct, outline attached N/A N/A Granulation Amount: Small (1-33%) N/A N/A Granulation Quality: Pale N/A N/A Necrotic Amount: Large (67-100%) N/A N/A Exposed Structures: Fascia: No N/A N/A Fat: No Tendon: No Muscle: No Joint: No Bone: No Limited to Skin Breakdown Epithelialization: Small (1-33%) N/A N/A Periwound Skin Texture: Edema: Yes N/A N/A Excoriation:  No Induration: No Callus: No Crepitus: No Fluctuance: No Friable: No Rash: No Scarring: No Periwound Skin Moist: Yes N/A N/A Moisture: Maceration: No Dry/Scaly: No Periwound Skin Color: Hemosiderin Staining: Yes N/A N/A Atrophie Blanche: No Cyanosis: No Ecchymosis: No Erythema: No Mottled: No Pallor: No Rubor: No Temperature: No Abnormality N/A N/A Tenderness on Yes N/A N/A Palpation: Wound Preparation: Ulcer Cleansing: N/A N/A Rinsed/Irrigated with Saline Topical Anesthetic Applied: Other: lidocaine 4% Treatment Notes Electronic Signature(s) Signed: 03/16/2016 5:04:09 PM By: Regan Lemming BSN, RN Guidotti, Yordi D. ([id number]) Entered By: Regan Lemming on 03/16/2016 15:49:32 Andrew [name] ([id number]) -------------------------------------------------------------------------------- [place] Details Patient Name: Andrew [name] Date of Service: 03/16/2016 3:45 PM Medical Record Number: [id number] Patient Account Number: [id number] Date of Birth/Sex: July 23, 1922 (82 y.o. Male) Treating RN: Baruch Gouty, RN, BSN, Velva Harman Primary Care Physician: Ramonita Lab Other Clinician: Referring Physician: Ramonita Lab Treating Physician/Extender: Frann Rider in Treatment: 9 Active Inactive Venous Leg Ulcer Nursing Diagnoses: Knowledge deficit related to disease process and management Potential for venous Insuffiency (use before diagnosis confirmed) Goals: Non-invasive venous studies are completed as ordered Date Initiated: 01/13/2016 Goal Status: Active Patient will maintain optimal edema control Date Initiated: 01/13/2016 Goal Status: Active Patient/caregiver will verbalize understanding of disease process and disease management Date Initiated: 01/13/2016 Goal Status: Active Verify adequate tissue perfusion prior to therapeutic compression application Date Initiated: 01/13/2016 Goal Status: Active Interventions: Assess peripheral edema status every  visit. Compression as ordered Provide education on venous insufficiency Notes: Wound/Skin Impairment Nursing Diagnoses: Impaired tissue integrity Knowledge deficit related to ulceration/compromised skin integrity Goals: Patient/caregiver will verbalize understanding of skin care regimen Date Initiated: 01/13/2016 Andrew [name] ([id number]) Goal Status: Active Ulcer/skin breakdown will have a volume reduction of 30% by week 4 Date Initiated: 01/13/2016 Goal Status: Active Ulcer/skin breakdown will have a volume reduction of 50% by week 8 Date Initiated: 01/13/2016 Goal Status: Active Ulcer/skin breakdown will have a volume reduction of 80% by week 12 Date Initiated: 01/13/2016 Goal Status: Active Ulcer/skin breakdown will heal within 14 weeks Date Initiated: 01/13/2016 Goal Status: Active Interventions: Assess patient/caregiver ability to obtain necessary supplies Assess patient/caregiver ability to perform ulcer/skin care regimen upon admission and as needed Assess ulceration(s) every visit Provide education on ulcer and skin care Notes: Electronic Signature(s) Signed: 03/16/2016 5:04:09 PM By: Regan Lemming BSN, RN Entered By: Regan Lemming on 03/16/2016 15:49:21 Andrew [name] ([id number]) -------------------------------------------------------------------------------- Pain Assessment Details Patient Name: Andrew [name] Date of Service: 03/16/2016 3:45 PM Medical Record Number: [id number] Patient Account Number:  [id number] Date of Birth/Sex: [birth year] (82 y.o. Male) Treating RN: Cornell Barman Primary Care Physician: Ramonita Lab Other Clinician: Referring Physician: Ramonita Lab Treating Physician/Extender: Frann Rider in Treatment: 9 Active Problems Location of Pain Severity and Description of Pain Patient Has Paino No Site Locations With Dressing Change: No Pain Management and Medication Current Pain Management: Electronic Signature(s) Signed: 03/16/2016  4:36:12 PM By: Gretta Cool, RN, BSN, Kim RN, BSN Entered By: Gretta Cool, RN, BSN, Kim on 03/16/2016 15:33:08 Andrew [name] ([id number]) -------------------------------------------------------------------------------- Patient/Caregiver Education Details Patient Name: Andrew [name] Date of Service: 03/16/2016 3:45 PM Medical Record Number: [id number] Patient Account Number: [id number] Date of Birth/Gender: [birth year] (82 y.o. Male) Treating RN: Cornell Barman Primary Care Physician: Ramonita Lab Other Clinician: Referring Physician: Ramonita Lab Treating Physician/Extender: Frann Rider in Treatment: 9 Education Assessment Education Provided To: Patient Education Topics Provided Venous: Handouts: Controlling Swelling with Compression Stockings Methods: Explain/Verbal Responses: Reinforcements needed Wound/Skin Impairment: Handouts: Caring for Your Ulcer, Other: Wound care as ordered Methods: Explain/Verbal Responses: Reinforcements needed Electronic Signature(s) Signed: 03/16/2016 4:36:12 PM By: Gretta Cool, RN, BSN, Kim RN, BSN Entered By: Gretta Cool, RN, BSN, Kim on 03/16/2016 16:14:10 Andrew [name] ([id number]) -------------------------------------------------------------------------------- Wound Assessment Details Patient Name: Andrew [name] Date of Service: 03/16/2016 3:45 PM Medical Record Number: [id number] Patient Account Number: [id number] Date of Birth/Sex: 29-Mar-1923 (82 y.o. Male) Treating RN: Cornell Barman Primary Care Physician: Ramonita Lab Other Clinician: Referring Physician: Ramonita Lab Treating Physician/Extender: Frann Rider in Treatment: 9 Wound Status Wound Number: 1 Primary Arterial Insufficiency Ulcer Etiology: Wound Location: Right Lower Leg - Medial Secondary Venous Leg Ulcer Wounding Event: Gradually Appeared Etiology: Date Acquired: 12/14/2015 Wound Open Weeks Of Treatment: 9 Status: Clustered Wound: No Comorbid Cataracts, Anemia, Chronic  Obstructive History: Pulmonary Disease (COPD), Arrhythmia, Hypertension, Peripheral Venous Disease, Osteoarthritis Photos Wound Measurements Length: (cm) 2.5 Width: (cm) 4 Depth: (cm) 0.2 Area: (cm) 7.854 Volume: (cm) 1.571 % Reduction in Area: -150% % Reduction in Volume: -150.2% Epithelialization: Small (1-33%) Tunneling: No Undermining: No Wound Description Full Thickness Without Exposed Foul Odor Afte Classification: Support Structures Wound Margin: Distinct, outline attached Exudate Medium Amount: Exudate Type: Serous Exudate Color: amber r Cleansing: No Wound Bed Granulation Amount: Small (1-33%) Exposed Structure Granulation Quality: Pale Fascia Exposed: No Andrew [name], [name] ([id number]) Necrotic Amount: Large (67-100%) Fat Layer Exposed: No Necrotic Quality: Adherent Slough Tendon Exposed: No Muscle Exposed: No Joint Exposed: No Bone Exposed: No Limited to Skin Breakdown Periwound Skin Texture Texture Color No Abnormalities Noted: No No Abnormalities Noted: No Callus: No Atrophie Blanche: No Crepitus: No Cyanosis: No Excoriation: No Ecchymosis: No Fluctuance: No Erythema: No Friable: No Hemosiderin Staining: Yes Induration: No Mottled: No Localized Edema: Yes Pallor: No Rash: No Rubor: No Scarring: No Temperature / Pain Moisture Temperature: No Abnormality No Abnormalities Noted: No Tenderness on Palpation: Yes Dry / Scaly: No Maceration: No Moist: Yes Wound Preparation Ulcer Cleansing: Rinsed/Irrigated with Saline Topical Anesthetic Applied: Other: lidocaine 4%, Treatment Notes Wound #1 (Right, Medial Lower Leg) 1. Cleansed with: Clean wound with Normal Saline 2. Anesthetic Topical Lidocaine 4% cream to wound bed prior to debridement 4. Dressing Applied: Medihoney Gel 5. Secondary Dressing Applied Dry Gauze Kerlix/Conform 7. Secured with Recruitment consultant) Signed: 03/16/2016 4:36:12 PM By: Gretta Cool, RN, BSN, Kim  RN, BSN Entered By: Gretta Cool, RN, BSN, Kim on 03/16/2016 15:40:12 Andrew [name], Andrew [name] ([id number]) Andrew [name], Andrew [name] ([id number]) -------------------------------------------------------------------------------- Vitals Details Patient Name: Andrew [name] Date of Service: 03/16/2016 3:45 PM Medical Record Number: [id number] Patient  Account Number: [id number] Date of Birth/Sex: 1922/11/02 (82 y.o. Male) Treating RN: Cornell Barman Primary Care Physician: Ramonita Lab Other Clinician: Referring Physician: Ramonita Lab Treating Physician/Extender: Frann Rider in Treatment: 9 Vital Signs Time Taken: 15:33 Temperature (F): 97.5 Height (in): 64 Pulse (bpm): 98 Weight (lbs): 195 Respiratory Rate (breaths/min): 16 Body Mass Index (BMI): 33.5 Blood Pressure (mmHg): 151/80 Reference Range: 80 - 120 mg / dl Electronic Signature(s) Signed: 03/16/2016 4:36:12 PM By: Gretta Cool, RN, BSN, Kim RN, BSN Entered By: Gretta Cool, RN, BSN, Kim on 03/16/2016 15:35:12

## 2016-03-17 NOTE — Progress Notes (Signed)
SHIAH, QUALE ([id number]) Visit Report for 03/16/2016 Chief Complaint Document Details Patient Name: Andrew [name], Andrew [name] Date of Service: 03/16/2016 3:45 PM Medical Record Number: [id number] Patient Account Number: [id number] Date of Birth/Sex: [birth year] (82 y.o. Male) Treating RN: Baruch Gouty, RN, BSN, Velva Harman Primary Care Physician: Ramonita Lab Other Clinician: Referring Physician: Ramonita Lab Treating Physician/Extender: Frann Rider in Treatment: 9 Information Obtained from: Patient Chief Complaint Patient presents for treatment of an open ulcer due to venous insufficiency which is had on and off on the right lower extremity for several months Electronic Signature(s) Signed: 03/16/2016 3:55:56 PM By: Christin Fudge MD, FACS Entered By: Christin Fudge on 03/16/2016 15:55:56 Andrew [name] ([id number]) -------------------------------------------------------------------------------- Debridement Details Patient Name: Andrew [name] Date of Service: 03/16/2016 3:45 PM Medical Record Number: [id number] Patient Account Number: [id number] Date of Birth/Sex: Nov 14, 1922 (82 y.o. Male) Treating RN: Baruch Gouty, RN, BSN, Velva Harman Primary Care Physician: Ramonita Lab Other Clinician: Referring Physician: Ramonita Lab Treating Physician/Extender: Frann Rider in Treatment: 9 Debridement Performed for Wound #1 Right,Medial Lower Leg Assessment: Performed By: Physician Christin Fudge, MD Debridement: Debridement Pre-procedure Yes - 15:47 Verification/Time Out Taken: Start Time: 15:47 Pain Control: Lidocaine 4% Topical Solution Level: Skin/Subcutaneous Tissue Total Area Debrided (L x 2.5 (cm) x 4 (cm) = 10 (cm) W): Tissue and other Non-Viable, Fibrin/Slough, Subcutaneous material debrided: Instrument: Curette Bleeding: Moderate Hemostasis Achieved: Pressure End Time: 15:52 Procedural Pain: 0 Post Procedural Pain: 0 Response to Treatment: Procedure was tolerated well Post  Debridement Measurements of Total Wound Length: (cm) 2.5 Width: (cm) 4 Depth: (cm) 0.2 Volume: (cm) 1.571 Character of Wound/Ulcer Post Requires Further Debridement Debridement: Severity of Tissue Post Debridement: Fat layer exposed Post Procedure Diagnosis Same as Pre-procedure Electronic Signature(s) Signed: 03/16/2016 3:55:38 PM By: Christin Fudge MD, FACS Signed: 03/16/2016 5:04:09 PM By: Regan Lemming BSN, RN Entered By: Christin Fudge on 03/16/2016 15:55:38 Andrew [name], Andrew [name] ([id number]) Andrew [name], Andrew [name] ([id number]) -------------------------------------------------------------------------------- HPI Details Patient Name: Andrew [name] Date of Service: 03/16/2016 3:45 PM Medical Record Number: [id number] Patient Account Number: [id number] Date of Birth/Sex: [birth year] (82 y.o. Male) Treating RN: Baruch Gouty, RN, BSN, [place] Primary Care Physician: Ramonita Lab Other Clinician: Referring Physician: Ramonita Lab Treating Physician/Extender: Frann Rider in Treatment: 9 History of Present Illness Location: right lower extremity ulceration Quality: Patient reports experiencing a dull pain to affected area(s). Severity: Patient states wound are getting worse. Duration: Patient has had the wound for > 3 months prior to seeking treatment at the wound center Timing: Pain in wound is Intermittent (comes and goes Context: The wound appeared gradually over time Modifying Factors: Other treatment(s) tried include:local care with hydrogen peroxide and alcohol Associated Signs and Symptoms: Patient reports having increase swelling. HPI Description: 82 year old gentleman who was seen by his PCP Dr. Kerrin Mo for a ulceration on the right calf which she's had for several months. The patient also has past medical history significant for hypertension, COPD, chronic kidney disease stage III,anemia, bilateral leg edema, status post appendectomy and prostate surgery. he is a former smoker and  has not smoked since 1994 he had a venous duplex study done of the right lower extremity in 2012 which was negative for a deep vein thrombosis. he has not had an arterial study done at anytime as per the medical records 01/27/2016 -- he recently had workup done at [facility] and his lower extremity venous duplex for reflux showed chronic deep vein thrombosis in the left femoral and popliteal veins, no evidence of  lower extremity superficial thrombophlebitis bilaterally and no incompetence of the greater small saphenous veins bilaterally. Lower extremity arterial study showed resting ABI of 0.62 on the right and 1.0 on the left. Dr Lucky Cowboy saw him and recommended a right lower extremity angiogram. 02/10/2016 -- he had his procedure done on 02/07/2016 for a peripheral arterial disease of the right lower extremity and hand percutaneous transluminal angioplasty of the tibioperoneal trunk and proximal peroneal artery, right distal SFA and popliteal arteries and also had a stent placed in the right distal SFA and popliteal artery. 03/01/2016 -- -- was seen by Dr. Weldon Inches office today by Ms. Marcelle Overlie. Is made of the fact that the patient is status post right lower extremity angiogram for arterial disease on 02/07/2016. His most recent ABI shows that on the right it is 0.44 and on the left 0.86. Plan was to see Dr. Lucky Cowboy back in the office and undergo a right lower extremity arterial duplex study to access if the stent is still patent. Electronic Signature(s) Signed: 03/16/2016 3:56:06 PM By: Christin Fudge MD, FACS Entered By: Christin Fudge on 03/16/2016 15:56:06 Andrew [name], Andrew [name] ([id number]) Andrew [name], Andrew [name] ([id number]) -------------------------------------------------------------------------------- Physical Exam Details Patient Name: Andrew [name] Date of Service: 03/16/2016 3:45 PM Medical Record Number: [id number] Patient Account Number: [id number] Date of  Birth/Sex: May 17, 1923 (82 y.o. Male) Treating RN: Baruch Gouty, RN, BSN, Velva Harman Primary Care Physician: Ramonita Lab Other Clinician: Referring Physician: Ramonita Lab Treating Physician/Extender: Frann Rider in Treatment: 9 Constitutional . Pulse regular. Respirations normal and unlabored. Afebrile. . Eyes Nonicteric. Reactive to light. Ears, Nose, Mouth, and Throat Lips, teeth, and gums WNL.Marland Kitchen Moist mucosa without lesions. Neck supple and nontender. No palpable supraclavicular or cervical adenopathy. Normal sized without goiter. Respiratory WNL. No retractions.. Cardiovascular Pedal Pulses WNL. No clubbing, cyanosis or edema. Lymphatic No adneopathy. No adenopathy. No adenopathy. Musculoskeletal Adexa without tenderness or enlargement.. Digits and nails w/o clubbing, cyanosis, infection, petechiae, ischemia, or inflammatory conditions.. Integumentary (Hair, Skin) No suspicious lesions. No crepitus or fluctuance. No peri-wound warmth or erythema. No masses.Marland Kitchen Psychiatric Judgement and insight Intact.. No evidence of depression, anxiety, or agitation.. Notes he has built up some subcutaneous debris and I have sharply removed this with a #3 curet and minimal bleeding controlled with pressure. Electronic Signature(s) Signed: 03/16/2016 3:56:46 PM By: Christin Fudge MD, FACS Entered By: Christin Fudge on 03/16/2016 15:56:44 Andrew [name] ([id number]) -------------------------------------------------------------------------------- Physician Orders Details Patient Name: Andrew [name] Date of Service: 03/16/2016 3:45 PM Medical Record Number: [id number] Patient Account Number: [id number] Date of Birth/Sex: 21-Feb-1923 (82 y.o. Male) Treating RN: Baruch Gouty, RN, BSN, Velva Harman Primary Care Physician: Ramonita Lab Other Clinician: Referring Physician: Ramonita Lab Treating Physician/Extender: Frann Rider in Treatment: 9 Verbal / Phone Orders: Yes Clinician: Afful, RN, BSN, Rita Read  Back and Verified: Yes Diagnosis Coding Wound Cleansing Wound #1 Right,Medial Lower Leg o Cleanse wound with mild soap and water o May Shower, gently pat wound dry prior to applying new dressing. o May shower with protection. Primary Wound Dressing Wound #1 Right,Medial Lower Leg o Medihoney gel Secondary Dressing Wound #1 Right,Medial Lower Leg o Gauze and Kerlix/Conform Dressing Change Frequency Wound #1 Right,Medial Lower Leg o Change dressing every day. Follow-up Appointments Wound #1 Right,Medial Lower Leg o Return Appointment in 1 week. Edema Control Wound #1 Right,Medial Lower Leg o Patient to wear own Juxtalite/Juzo compression garment. - Patient is non compliant . He has been educated on the importance of wearing it. o Elevate legs to the  level of the heart and pump ankles as often as possible Additional Orders / Instructions Wound #1 Right,Medial Lower Leg o Increase protein intake. o Activity as tolerated o Other: - ZInc, MVI, VIT A, NICOS, Andrew ([id number]) Electronic Signature(s) Signed: 03/16/2016 4:14:15 PM By: Christin Fudge MD, FACS Signed: 03/16/2016 5:04:09 PM By: Regan Lemming BSN, RN Previous Signature: 03/16/2016 3:58:34 PM Version By: Christin Fudge MD, FACS Entered By: Regan Lemming on 03/16/2016 Andrew [name] ([id number]) -------------------------------------------------------------------------------- Problem List Details Patient Name: Andrew [name] Date of Service: 03/16/2016 3:45 PM Medical Record Number: [id number] Patient Account Number: [id number] Date of Birth/Sex: [birth year] (82 y.o. 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 Male) Treating RN: Baruch Gouty, RN, BSN, [place] Primary Care Physician: Ramonita Lab Other Clinician: Referring Physician: Ramonita Lab Treating Physician/Extender: Frann Rider in Treatment: 9 Subjective Chief Complaint Information obtained from Patient Patient presents for treatment of an open ulcer due to venous insufficiency which is had on and off on the right lower extremity for several months History of Present Illness (HPI) The following HPI elements were documented for the patient's wound: Location: right lower extremity ulceration Quality: Patient reports experiencing a dull pain to affected area(s). Severity: Patient states wound are getting worse. Duration: Patient has had the wound for > 3 months prior to seeking treatment at the wound center Timing: Pain in wound is Intermittent (comes and goes Context: The wound appeared gradually over time Modifying Factors: Other treatment(s) tried include:local care with hydrogen peroxide and alcohol Associated Signs and Symptoms: Patient reports having increase swelling. 82 year old gentleman who was seen by his PCP Dr. Kerrin Mo for a ulceration on the right calf which she's had for several months. The  patient also has past medical history significant for hypertension, COPD, chronic kidney disease stage III,anemia, bilateral leg edema, status post appendectomy and prostate surgery. he is a former smoker and has not smoked since 1994 he had a venous duplex study done of the right lower extremity in 2012 which was negative for a deep vein thrombosis. he has not had an arterial study done at anytime as per the medical records 01/27/2016 -- he recently had workup done at [facility] and his lower extremity venous duplex for reflux showed chronic deep vein thrombosis in the left femoral and popliteal veins, no evidence of lower extremity superficial thrombophlebitis bilaterally and no incompetence of the greater small saphenous veins bilaterally. Lower extremity arterial study showed resting ABI of 0.62 on the right and 1.0 on the left. Dr Lucky Cowboy saw him and recommended a right lower extremity angiogram. 02/10/2016 -- he had his procedure done on 02/07/2016 for a peripheral arterial disease of the right lower extremity and hand percutaneous transluminal angioplasty of the  tibioperoneal trunk and proximal peroneal artery, right distal SFA and popliteal arteries and also had a stent placed in the right distal SFA and popliteal artery. 03/01/2016 -- -- was seen by Dr. Lucky Cowboy s office today by Ms. Marcelle Overlie. Is made of the fact that the patient is status post right lower extremity angiogram for arterial disease on 02/07/2016. His most recent [place] ([id number]) ABI shows that on the right it is 0.44 and on the left 0.86. Plan was to see Dr. Lucky Cowboy back in the office and undergo a right lower extremity arterial duplex study to access if the stent is still patent. Objective Constitutional Pulse regular. Respirations normal and unlabored. Afebrile. Vitals Time Taken: 3:33 PM, Height: 64 in, Weight: 195 lbs, BMI: 33.5, Temperature: 97.5 F, Pulse: 98 bpm, Respiratory  Rate: 16 breaths/min, Blood Pressure: 151/80 mmHg. Eyes Nonicteric. Reactive to light. Ears, Nose, Mouth, and Throat Lips, teeth, and gums WNL.Marland Kitchen Moist mucosa without lesions. Neck supple and nontender. No palpable supraclavicular or cervical adenopathy. Normal sized without goiter. Respiratory WNL. No retractions.. Cardiovascular Pedal Pulses WNL. No clubbing, cyanosis or edema. Lymphatic No adneopathy. No adenopathy. No adenopathy. Musculoskeletal Adexa without tenderness or enlargement.. Digits and nails w/o clubbing, cyanosis, infection, petechiae, ischemia, or inflammatory conditions.Marland Kitchen Psychiatric Judgement and insight Intact.. No evidence of depression, anxiety, or agitation.. General Notes: he has built up some subcutaneous debris and I have sharply removed this with a #3 curet and minimal bleeding controlled with pressure. Integumentary (Hair, Skin) No suspicious lesions. No crepitus or fluctuance. No peri-wound warmth or erythema. No masses.Marland Kitchen Andrew [name], [name] ([id number]) Wound #1 status is Open. Original cause of wound was Gradually Appeared. The wound is located on the Right,Medial Lower Leg. The wound measures 2.5cm length x 4cm width x 0.2cm depth; 7.854cm^2 area and 1.571cm^3 volume. The wound is limited to skin breakdown. There is no tunneling or undermining noted. There is a medium amount of serous drainage noted. The wound margin is distinct with the outline attached to the wound base. There is small (1-33%) pale granulation within the wound bed. There is a large (67-100%) amount of necrotic tissue within the wound bed including Adherent Slough. The periwound skin appearance exhibited: Localized Edema, Moist, Hemosiderin Staining. The periwound skin appearance did not exhibit: Callus, Crepitus, Excoriation, Fluctuance, Friable, Induration, Rash, Scarring, Dry/Scaly, Maceration, Atrophie Blanche, Cyanosis, Ecchymosis, Mottled, Pallor, Rubor, Erythema.  Periwound temperature was noted as No Abnormality. The periwound has tenderness on palpation. 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 There was a Skin/Subcutaneous Tissue Debridement [id number]) debridement with total area of 10 sq cm performed by Christin Fudge, MD. with the following instrument(s): Curette to remove Non-Viable tissue/material including Fibrin/Slough and Subcutaneous after achieving pain control using Lidocaine 4% Topical Solution. A time out was conducted at 15:47, prior to the start of the procedure. A Moderate amount of bleeding was controlled with Pressure. The procedure was tolerated well with a pain level of 0 throughout and a pain level of 0 following the procedure. Post Debridement Measurements: 2.5cm length x 4cm width x 0.2cm depth; 1.571cm^3 volume. Character of Wound/Ulcer Post Debridement requires further debridement. Severity of Tissue Post Debridement is: Fat layer exposed.  Post procedure Diagnosis Wound #1: Same as Pre-Procedure Andrew [name], Andrew [name] ([id number]) Plan Wound Cleansing: Wound #1 Right,Medial Lower Leg: Cleanse wound with mild soap and water May Shower, gently pat wound dry prior to applying new dressing. May shower with protection. Primary Wound Dressing: Wound #1 Right,Medial Lower Leg: Medihoney gel Secondary Dressing: Wound #1 Right,Medial Lower Leg: Gauze and Kerlix/Conform Dressing Change Frequency: Wound #1 Right,Medial Lower Leg: Change dressing every day. Follow-up Appointments: Wound #1 Right,Medial Lower Leg: Return Appointment in 1 week. Edema Control: Wound #1  Right,Medial Lower Leg: Patient to wear own Juxtalite/Juzo compression garment. - Patient is non compliant . He has been educated on the importance of wearing it. Elevate legs to the level of the heart and pump ankles as often as possible Additional Orders / Instructions: Wound #1 Right,Medial Lower Leg: Increase protein intake. Activity as tolerated Other: - ZInc, MVI, VIT A, C We will continue to treat him with: 1. medihoney to be applied to the wound and apply light Kerlix dressing over this 2. Elevation and exercise 3. regular visits the wound center 4. I have recommended that he see the vascular office to schedule his follow up as planned -- the appointment is next week Electronic Signature(s) Signed: 03/16/2016 4:17:58 PM By: Christin Fudge MD, FACS Previous Signature: 03/16/2016 3:57:19 PM Version By: Christin Fudge MD, FACS Entered By: Christin Fudge on 03/16/2016 16:17:58 Andrew [name], Andrew [name] ([id number]) ARI, [place] ([id number]) -------------------------------------------------------------------------------- [place] Details Patient Name: Andrew [name] Date of Service: 03/16/2016 Medical Record Number: [id number] Patient Account Number: [id number] Date of Birth/Sex: Feb 16, 1923 (82 y.o. 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 I70.232 Atherosclerosis of native arteries of right leg with ulceration of calf Facility Procedures CPT4: Description Modifier Quantity Code [id number] - DEB SUBQ TISSUE 20 SQ CM/< 1 ICD-10 Description Diagnosis I89.0 Lymphedema, not elsewhere classified I73.9 Peripheral  vascular disease, unspecified I83.212 Varicose veins of right lower extremity  with both ulcer of calf and inflammation I70.232 Atherosclerosis of native arteries of right leg with ulceration of calf Physician Procedures CPT4: Description Modifier Quantity Code [id number] - WC PHYS SUBQ TISS 20 SQ CM 1 ICD-10 Description Diagnosis I89.0 Lymphedema, not elsewhere classified I73.9 Peripheral vascular disease, unspecified I83.212 Varicose veins of right lower extremity  with both ulcer of calf and inflammation I70.232 Atherosclerosis of native arteries of right leg with ulceration of calf Electronic Signature(s) Signed: 03/16/2016 3:58:06 PM By: Christin Fudge MD, FACS Niederer, Carold D. ([id number]) Entered By: Christin Fudge on 03/16/2016 15:58:06

## 2016-03-20 ENCOUNTER — Emergency Department
Admission: EM | Admit: 2016-03-20 | Discharge: 2016-03-20 | Disposition: A | Discharge status: Home / Self Care | Payer: Medicare Other | Attending: Student | Admitting: Student

## 2016-03-20 ENCOUNTER — Encounter: Payer: Self-pay | Admitting: Intensive Care

## 2016-03-20 DIAGNOSIS — S91114A Laceration without foreign body of right lesser toe(s) without damage to nail, initial encounter: Secondary | ICD-10-CM | POA: Diagnosis not present

## 2016-03-20 DIAGNOSIS — Y92009 Unspecified place in unspecified non-institutional (private) residence as the place of occurrence of the external cause: Secondary | ICD-10-CM | POA: Diagnosis not present

## 2016-03-20 DIAGNOSIS — Y9389 Activity, other specified: Secondary | ICD-10-CM | POA: Insufficient documentation

## 2016-03-20 DIAGNOSIS — Z79899 Other long term (current) drug therapy: Secondary | ICD-10-CM | POA: Diagnosis not present

## 2016-03-20 DIAGNOSIS — N189 Chronic kidney disease, unspecified: Secondary | ICD-10-CM | POA: Diagnosis not present

## 2016-03-20 DIAGNOSIS — J45909 Unspecified asthma, uncomplicated: Secondary | ICD-10-CM | POA: Insufficient documentation

## 2016-03-20 DIAGNOSIS — W268XXA Contact with other sharp object(s), not elsewhere classified, initial encounter: Secondary | ICD-10-CM | POA: Diagnosis not present

## 2016-03-20 DIAGNOSIS — Z87891 Personal history of nicotine dependence: Secondary | ICD-10-CM | POA: Diagnosis not present

## 2016-03-20 DIAGNOSIS — I129 Hypertensive chronic kidney disease with stage 1 through stage 4 chronic kidney disease, or unspecified chronic kidney disease: Secondary | ICD-10-CM | POA: Diagnosis not present

## 2016-03-20 DIAGNOSIS — Z7982 Long term (current) use of aspirin: Secondary | ICD-10-CM | POA: Insufficient documentation

## 2016-03-20 DIAGNOSIS — Y999 Unspecified external cause status: Secondary | ICD-10-CM | POA: Diagnosis not present

## 2016-03-20 NOTE — Discharge Instructions (Signed)
DO NOT REMOVE THE SURGICEL BANDAGE  Please see your primary doctor or go to [facility] walk in for wound recheck tomorrow, but only the top bandage should be removed.   Keep foot elevated when not walking.

## 2016-03-20 NOTE — ED Provider Notes (Signed)
[facility] Emergency Department Provider Note  ____________________________________________  Time seen: Approximately 7:44 PM  I have reviewed the triage vital signs and the nursing notes.   HISTORY  Chief Complaint Toe Injury (R foot, 2nd toe)    HPI Andrew [name] is a 82 y.o. male , NAD, presents to the emergency department accompanied by a family friend who assists with history. Patient states he was clipping his toenails around 11 AM today and clipped the "meat" at the tip of the right second toe. States it has been bleeding aggressively since that time. Has not been able control bleeding at home. Denies any chest pain, shortness of breath, dizziness, visual changes, lightheadedness, LOC. Is on Plavix and a daily aspirin.   Past Medical History:  Diagnosis Date  . Asthma   . Chronic kidney disease   . Hypertension   . Peripheral vascular disease [facility])     Patient Active Problem List   Diagnosis Date Noted  . Chronic venous hypertension with ulcer ([place]) 01/13/2016  . Chronic venous hypertension with ulcer ([place]) 01/13/2016    Past Surgical History:  Procedure Laterality Date  . PERIPHERAL VASCULAR CATHETERIZATION Right 02/07/2016   Procedure: Lower Extremity Angiography;  Surgeon: Algernon Huxley, MD;  Location: [facility];  Service: Cardiovascular;  Laterality: Right;  . SHOULDER SURGERY  1974  . UPPER GI ENDOSCOPY      Prior to Admission medications   Medication Sig Start Date End Date Taking? Authorizing Provider  aspirin EC 81 MG tablet Take 1 tablet (81 mg total) by mouth daily. 02/07/16   Algernon Huxley, MD  atorvastatin (LIPITOR) 20 MG tablet Take 1 tablet (20 mg total) by mouth daily. 02/07/16   Algernon Huxley, MD  clopidogrel (PLAVIX) 75 MG tablet Take 1 tablet (75 mg total) by mouth daily. 02/07/16   Algernon Huxley, MD  cyanocobalamin 1000 MCG tablet Take 1,000 mcg by mouth daily.    Historical Provider, MD  fexofenadine (ALLEGRA) 180 MG  tablet Take 180 mg by mouth daily.    Historical Provider, MD  fluticasone (VERAMYST) 27.5 MCG/SPRAY nasal spray Place 2 sprays into the nose daily.    Historical Provider, MD  Fluticasone-Salmeterol (ADVAIR) 250-50 MCG/DOSE AEPB Inhale 1 puff into the lungs 2 (two) times daily.    Historical Provider, MD  hydrochlorothiazide (MICROZIDE) 12.5 MG capsule Take 12.5 mg by mouth daily.    Historical Provider, MD  levothyroxine (SYNTHROID, LEVOTHROID) 75 MCG tablet Take 75 mcg by mouth daily before breakfast.    Historical Provider, MD  Misc Natural Products (LUTEIN 20) CAPS Take by mouth daily.    Historical Provider, MD  omeprazole (PRILOSEC) 20 MG capsule Take 20 mg by mouth daily.    Historical Provider, MD  tiotropium (SPIRIVA HANDIHALER) 18 MCG inhalation capsule Place 18 mcg into inhaler and inhale daily.    Historical Provider, MD  traMADol (ULTRAM) 50 MG tablet Take 1 tablet (50 mg total) by mouth every 6 (six) hours as needed. 02/07/16   Algernon Huxley, MD  vitamin E 400 UNIT capsule Take 400 Units by mouth daily.    Historical Provider, MD    Allergies Review of patient's allergies indicates no known allergies.  Family History  Problem Relation Age of Onset  . Heart failure Mother   . Stroke Father     Social History Social History  Substance Use Topics  . Smoking status: Former Research scientist (life sciences)  . Smokeless tobacco: Former Systems developer  Quit date: 05/16/1993  . Alcohol use No     Review of Systems  Constitutional: No fever/chills, Fatigue Eyes: No visual changes.  Cardiovascular: No chest pain, Palpitations. Respiratory: No shortness of breath.  Musculoskeletal: Negative for Foot or toe pain.  Skin: Laceration to right second toe. Negative for rash. Neurological: Negative for numbness, weakness, tingling. No dizziness, LOC, lightheadedness. 10-point ROS otherwise negative.  ____________________________________________   PHYSICAL EXAM:  VITAL SIGNS: ED Triage Vitals  Enc Vitals Group      BP 03/20/16 1929 (!) 101/59     Pulse Rate 03/20/16 1929 95     Resp 03/20/16 1929 20     Temp 03/20/16 1929 97.8 F (36.6 C)     Temp Source 03/20/16 1929 Oral     SpO2 03/20/16 1929 97 %     Weight 03/20/16 1929 183 lb (83 kg)     Height 03/20/16 1929 5\' 7"  (1.702 m)     Head Circumference --      Peak Flow --      Pain Score 03/20/16 1930 10     Pain Loc --      Pain Edu? --      Excl. in [place]? --      Constitutional: Alert and oriented. Well appearing and in no acute distress. Eyes: Conjunctivae are normal.  Head: Atraumatic. Cardiovascular: Good peripheral circulation with 2+ pulses noted in the right lower extremity. Capillary refill is brisk in all digits of the right foot. Respiratory: Normal respiratory effort without tachypnea or retractions.  Gastrointestinal: Soft and nontender. No distention. No CVA tenderness. Musculoskeletal: No lower extremity tenderness nor edema.  No joint effusions. Neurologic:  Normal speech and language. No gross focal neurologic deficits are appreciated.  Skin:  0.4cm flap laceration to the tip of the right 2nd toe with steady bleeding. Skin is warm, dry. No rash noted. Psychiatric: Mood and affect are normal. Speech and behavior are normal. Patient exhibits appropriate insight and judgement.   ____________________________________________   LABS  None ____________________________________________  EKG  None ____________________________________________  RADIOLOGY  None ____________________________________________    PROCEDURES  Procedure(s) performed: None   Procedures   Medications - No data to display   ____________________________________________   INITIAL IMPRESSION / ASSESSMENT AND PLAN / ED COURSE  Pertinent labs & imaging results that were available during my care of the patient were reviewed by me and considered in my medical decision making (see chart for details).  Clinical Course  Comment By Time   Surgicel and pressure dressing was applied to the right great toe and right foot elevated. Will recheck in approximately 20 minutes. Braxton Feathers, PA-C 09/18 1950  Right great toe was reassessed and hemostasis has been achieved with Surgicel in place. A new nonstick gauze will be applied and dressing placed to protect the toe. Patient will be advised to follow-up with his primary care provider or [facility] tomorrow for wound recheck but I stressed that the Surgicel should remain in place as if it is removed the clot formation will also be removed. If for any reason the toe begins to bleed again the patient may return to this emergency department for further evaluation and treatment. Braxton Feathers, PA-C 09/18 2035    Patient's diagnosis is consistent with Laceration of second toe of the right foot. Patient will be discharged home with instructions to leave dressing on the right second toe in place until seen in follow-up by his primary care provider or  [place]. It is highly advised that only the outer portion of the dressing be removed at that time and Surgicel to remain in place. If Surgicel is removed, the clot will also be removed and bleeding resume. Patient and his family friend at the bedside verbalized agreement and understanding of the plan of care. Patient is given ED precautions to return to the ED for any worsening or new symptoms.     ____________________________________________  FINAL CLINICAL IMPRESSION(S) / ED DIAGNOSES  Final diagnoses:  Laceration of second toe of right foot, initial encounter      NEW MEDICATIONS STARTED DURING THIS VISIT:  Discharge Medication List as of 03/20/2016  8:31 PM           Braxton Feathers, PA-C 03/20/16 2147    Joanne Gavel, MD 03/21/16 [phone]

## 2016-03-20 NOTE — ED Triage Notes (Signed)
Patient presents to ER with laceration for R foot, 2nd toe. Patient reports he was clipping his toe nails and nicked the top of his toe. Bloody drainage noted.

## 2016-03-23 ENCOUNTER — Encounter: Payer: Medicare Other | Admitting: Surgery

## 2016-03-23 DIAGNOSIS — I83212 Varicose veins of right lower extremity with both ulcer of calf and inflammation: Secondary | ICD-10-CM | POA: Diagnosis not present

## 2016-03-25 NOTE — Progress Notes (Signed)
CHRISTEPHER, FRONHEISER ([id number]) Visit Report for 03/23/2016 Arrival Information Details Patient Name: Andrew [name], Andrew [name] Date of Service: 03/23/2016 2:30 PM Medical Record Number: [id number] Patient Account Number: [id number] Date of Birth/Sex: 04/22/1923 (82 y.o. Male) Treating RN: Ahmed Prima Primary Care Physician: Ramonita Lab Other Clinician: Referring Physician: Ramonita Lab Treating Physician/Extender: Frann Rider in Treatment: 10 Visit Information History Since Last Visit All ordered tests and consults were completed: No Patient Arrived: Kasandra Knudsen Added or deleted any medications: No Arrival Time: 14:32 Any new allergies or adverse reactions: No Accompanied By: self Had a fall or experienced change in No Transfer Assistance: EasyPivot Patient activities of daily living that may affect Lift risk of falls: Patient Identification Verified: Yes Signs or symptoms of abuse/neglect since last No Secondary Verification Process Yes visito Completed: Hospitalized since last visit: No Patient Requires Transmission- No Pain Present Now: No Based Precautions: Patient Has Alerts: Yes Patient Alerts: ABI Non Compressible Electronic Signature(s) Signed: 03/24/2016 5:24:41 PM By: Alric Quan Entered By: Alric Quan on 03/23/2016 14:32:28 Andrew [name] ([id number]) -------------------------------------------------------------------------------- Clinic Level of Care Assessment Details Patient Name: Andrew [name] Date of Service: 03/23/2016 2:30 PM Medical Record Number: [id number] Patient Account Number: [id number] Date of Birth/Sex: 05/05/1923 (82 y.o. Male) Treating RN: Ahmed Prima Primary Care Physician: Ramonita Lab Other Clinician: Referring Physician: Ramonita Lab Treating Physician/Extender: Frann Rider in Treatment: 10 Clinic Level of Care Assessment Items TOOL 4 Quantity Score X - Use when only an EandM is performed on FOLLOW-UP visit 1  0 ASSESSMENTS - Nursing Assessment / Reassessment X - Reassessment of Co-morbidities (includes updates in patient status) 1 10 X - Reassessment of Adherence to Treatment Plan 1 5 ASSESSMENTS - Wound and Skin Assessment / Reassessment X - Simple Wound Assessment / Reassessment - one wound 1 5 []  - Complex Wound Assessment / Reassessment - multiple wounds 0 []  - Dermatologic / Skin Assessment (not related to wound area) 0 ASSESSMENTS - Focused Assessment X - Circumferential Edema Measurements - multi extremities 1 5 []  - Nutritional Assessment / Counseling / Intervention 0 []  - Lower Extremity Assessment (monofilament, tuning fork, pulses) 0 []  - Peripheral Arterial Disease Assessment (using hand held doppler) 0 ASSESSMENTS - Ostomy and/or Continence Assessment and Care []  - Incontinence Assessment and Management 0 []  - Ostomy Care Assessment and Management (repouching, etc.) 0 PROCESS - Coordination of Care []  - Simple Patient / Family Education for ongoing care 0 X - Complex (extensive) Patient / Family Education for ongoing care 1 20 X - Staff obtains Programmer, systems, Records, Test Results / Process Orders 1 10 []  - Staff telephones HHA, Nursing Homes / Clarify orders / etc 0 []  - Routine Transfer to another Facility (non-emergent condition) 0 Andrew [name], AVALO. ([id number]) []  - Routine Hospital Admission (non-emergent condition) 0 []  - New Admissions / Biomedical engineer / Ordering NPWT, Apligraf, etc. 0 []  - Emergency Hospital Admission (emergent condition) 0 X - Simple Discharge Coordination 1 10 []  - Complex (extensive) Discharge Coordination 0 PROCESS - Special Needs []  - Pediatric / Minor Patient Management 0 []  - Isolation Patient Management 0 []  - Hearing / Language / Visual special needs 0 []  - Assessment of Community assistance (transportation, D/C planning, etc.) 0 []  - Additional assistance / Altered mentation 0 []  - Support Surface(s) Assessment (bed, cushion, seat,  etc.) 0 INTERVENTIONS - Wound Cleansing / Measurement X - Simple Wound Cleansing - one wound 1 5 []  - Complex Wound Cleansing - multiple wounds 0 X - Wound  Imaging (photographs - any number of wounds) 1 5 []  - Wound Tracing (instead of photographs) 0 X - Simple Wound Measurement - one wound 1 5 []  - Complex Wound Measurement - multiple wounds 0 INTERVENTIONS - Wound Dressings []  - Small Wound Dressing one or multiple wounds 0 X - Medium Wound Dressing one or multiple wounds 1 15 []  - Large Wound Dressing one or multiple wounds 0 X - Application of Medications - topical 1 5 []  - Application of Medications - injection 0 INTERVENTIONS - Miscellaneous []  - External ear exam 0 Andrew [name], [name] ([id number]) []  - Specimen Collection (cultures, biopsies, blood, body fluids, etc.) 0 []  - Specimen(s) / Culture(s) sent or taken to Lab for analysis 0 []  - Patient Transfer (multiple staff / Harrel Lemon Lift / Similar devices) 0 []  - Simple Staple / Suture removal (25 or less) 0 []  - Complex Staple / Suture removal (26 or more) 0 []  - Hypo / Hyperglycemic Management (close monitor of Blood Glucose) 0 []  - Ankle / Brachial Index (ABI) - do not check if billed separately 0 X - Vital Signs 1 5 Has the patient been seen at the hospital within the last three years: Yes Total Score: 105 Level Of Care: New/Established - Level 3 Electronic Signature(s) Signed: 03/24/2016 5:24:41 PM By: Alric Quan Entered By: Alric Quan on 03/23/2016 15:20:32 Andrew [name] ([id number]) -------------------------------------------------------------------------------- Encounter Discharge Information Details Patient Name: Andrew [name] Date of Service: 03/23/2016 2:30 PM Medical Record Number: [id number] Patient Account Number: [id number] Date of Birth/Sex: 1922/10/18 (82 y.o. Male) Treating RN: Ahmed Prima Primary Care Physician: Ramonita Lab Other Clinician: Referring Physician: Ramonita Lab Treating  Physician/Extender: Frann Rider in Treatment: 10 Encounter Discharge Information Items Discharge Pain Level: 0 Discharge Condition: Stable Ambulatory Status: Cane Discharge Destination: Home Transportation: Private Auto Accompanied By: self Schedule Follow-up Appointment: Yes Medication Reconciliation completed Yes and provided to Patient/Care Damarkus Balis: Provided on Clinical Summary of Care: 03/23/2016 Form Type Recipient Paper Patient CB Electronic Signature(s) Signed: 03/23/2016 3:06:24 PM By: Ruthine Dose Entered By: Ruthine Dose on 03/23/2016 15:06:24 Andrew [name] ([id number]) -------------------------------------------------------------------------------- Lower Extremity Assessment Details Patient Name: Andrew [name] Date of Service: 03/23/2016 2:30 PM Medical Record Number: [id number] Patient Account Number: [id number] Date of Birth/Sex: [birth year] (82 y.o. Male) Treating RN: Ahmed Prima Primary Care Physician: Ramonita Lab Other Clinician: Referring Physician: Ramonita Lab Treating Physician/Extender: Frann Rider in Treatment: 10 Edema Assessment Assessed: [Left: No] [Right: No] E[Left: dema] [Right: :] Calf Left: Right: Point of Measurement: 34 cm From Medial Instep cm 31.5 cm Ankle Left: Right: Point of Measurement: 13 cm From Medial Instep cm 19 cm Vascular Assessment Pulses: Posterior Tibial Palpable: [Right:No] Doppler: [Right:Monophasic] Dorsalis Pedis Palpable: [Right:No] Extremity colors, hair growth, and conditions: Extremity Color: [Right:Normal] Temperature of Extremity: [Right:Warm] Capillary Refill: [Right:< 3 seconds] Toe Nail Assessment Left: Right: Thick: Yes Discolored: Yes Deformed: Yes Improper Length and Hygiene: No Electronic Signature(s) Signed: 03/24/2016 5:24:41 PM By: Alric Quan Entered By: Alric Quan on 03/23/2016 14:51:54 Andrew [name], Andrew [name] ([id number]) [name], [place].  ([id number]) -------------------------------------------------------------------------------- Multi Wound Chart Details Patient Name: Andrew [name] Date of Service: 03/23/2016 2:30 PM Medical Record Number: [id number] Patient Account Number: [id number] Date of Birth/Sex: 1922/10/18 (82 y.o. 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] [N/A:N/A] Comorbid History: [1:Cataracts, Anemia, Chronic Obstructive Pulmonary Disease (COPD), Arrhythmia, Hypertension, Peripheral Venous Disease, Osteoarthritis] [N/A:N/A] Date Acquired: [1:12/14/2015] [N/A:N/A] Weeks of Treatment: [1:10] [N/A:N/A] Wound Status: [1:Open] [N/A:N/A] Measurements L x W x D 2.7x3.5x0.2 [N/A:N/A] (cm) Area (cm) : [1:7.422] [N/A:N/A] Volume (cm) : [1:1.484] [N/A:N/A] % Reduction in Area: [1:-136.20%] [N/A:N/A] % Reduction in Volume: -136.30% [N/A:N/A] Classification: [1:Full Thickness Without Exposed Support Structures] [N/A:N/A] Exudate Amount: [1:Large] [N/A:N/A] Exudate Type: [1:Serous] [N/A:N/A] Exudate Color: [1:amber] [N/A:N/A] Wound Margin: [1:Distinct, outline attached N/A] Granulation Amount: [1:Small (1-33%)] [N/A:N/A] Granulation Quality: [1:Pink] [N/A:N/A] Necrotic Amount: Large (67-100%) N/A N/A Exposed Structures: Fascia: No N/A N/A Fat: No Tendon: No Muscle: No Joint: No Bone: No Limited to Skin Breakdown Epithelialization: Small (1-33%) N/A  N/A Periwound Skin Texture: Edema: Yes N/A N/A Excoriation: No Induration: No Callus: No Crepitus: No Fluctuance: No Friable: No Rash: No Scarring: No Periwound Skin Moist: Yes N/A N/A Moisture: Maceration: No Dry/Scaly: No Periwound Skin Color: Hemosiderin Staining: Yes N/A N/A Atrophie Blanche: No Cyanosis: No Ecchymosis: No Erythema: No Mottled: No Pallor: No Rubor: No Temperature: No Abnormality N/A N/A Tenderness on Yes N/A N/A Palpation: Wound Preparation: Ulcer Cleansing: N/A N/A Rinsed/Irrigated with Saline Topical Anesthetic Applied: Other: lidocaine 4% Treatment Notes Electronic Signature(s) Signed: 03/24/2016 5:24:41 PM By: Alric Quan Entered By: Alric Quan on 03/23/2016 14:43:59 Andrew [name] ([id number]) -------------------------------------------------------------------------------- [place] Details Patient Name: Andrew [name] Date of Service: 03/23/2016 2:30 PM Medical Record Number: [id number] Patient Account Number: [id number] Date of Birth/Sex: [birth year] (82 y.o. Male) Treating RN: Ahmed Prima Primary Care Physician: Ramonita Lab Other Clinician: Referring Physician: Ramonita Lab Treating Physician/Extender: Frann Rider in Treatment: 10 Active Inactive Venous Leg Ulcer Nursing Diagnoses: Knowledge deficit related to disease process and management Potential for venous Insuffiency (use before diagnosis confirmed) Goals: Non-invasive venous studies are completed as ordered Date Initiated: 01/13/2016 Goal Status: Active Patient will maintain optimal edema control Date Initiated: 01/13/2016 Goal Status: Active Patient/caregiver will verbalize understanding of disease process and disease management Date Initiated: 01/13/2016 Goal Status: Active Verify adequate tissue perfusion prior to therapeutic compression application Date Initiated: 01/13/2016 Goal Status: Active Interventions: Assess  peripheral edema status every visit. Compression as ordered Provide education on venous insufficiency Notes: Wound/Skin Impairment Nursing Diagnoses: Impaired tissue integrity Knowledge deficit related to ulceration/compromised skin integrity Goals: Patient/caregiver will verbalize understanding of skin care regimen Date Initiated: 01/13/2016 Andrew [name] ([id number]) Goal Status: Active Ulcer/skin breakdown will have a volume reduction of 30% by week 4 Date Initiated: 01/13/2016 Goal Status: Active Ulcer/skin breakdown will have a volume reduction of 50% by week 8 Date Initiated: 01/13/2016 Goal Status: Active Ulcer/skin breakdown will have a volume reduction of 80% by week 12 Date Initiated: 01/13/2016 Goal Status: Active Ulcer/skin breakdown will heal within 14 weeks Date Initiated: 01/13/2016 Goal Status: Active Interventions: Assess patient/caregiver ability to obtain necessary supplies Assess patient/caregiver ability to perform ulcer/skin care regimen upon admission and as needed Assess ulceration(s) every visit Provide education on ulcer and skin care Notes: Electronic Signature(s) Signed: 03/24/2016 5:24:41 PM By: Alric Quan Entered By: Alric Quan on 03/23/2016 14:52:16 Andrew [name] ([id number]) -------------------------------------------------------------------------------- Pain Assessment Details Patient Name: Andrew [name] Date of Service: 03/23/2016 2:30 PM Medical Record Number: [id number] Patient Account Number: [id number] Date of Birth/Sex: 1923-02-05 (82 y.o. Male) Treating RN: Carolyne Fiscal,  Debi Primary Care Physician: Ramonita Lab Other Clinician: Referring Physician: Ramonita Lab Treating Physician/Extender: Frann Rider in Treatment: 10 Active Problems Location of Pain Severity and Description of Pain Patient Has Paino No Site Locations With Dressing Change: No Pain Management and Medication Current Pain  Management: Electronic Signature(s) Signed: 03/24/2016 5:24:41 PM By: Alric Quan Entered By: Alric Quan on 03/23/2016 14:32:33 Andrew [name], Andrew [name] ([id number]) -------------------------------------------------------------------------------- Patient/Caregiver Education Details Patient Name: Andrew [name] Date of Service: 03/23/2016 2:30 PM Medical Record Number: [id number] Patient Account Number: [id number] Date of Birth/Gender: Jan 01, 1923 (82 y.o. Male) Treating RN: Ahmed Prima Primary Care Physician: Ramonita Lab Other Clinician: Referring Physician: Ramonita Lab Treating Physician/Extender: Frann Rider in Treatment: 10 Education Assessment Education Provided To: Patient Education Topics Provided Wound/Skin Impairment: Handouts: Other: change dressing as ordered Methods: Demonstration, Explain/Verbal Responses: State content correctly Electronic Signature(s) Signed: 03/24/2016 5:24:41 PM By: Alric Quan Entered By: Alric Quan on 03/23/2016 14:55:33 [place], Andrew [name] ([id number]) -------------------------------------------------------------------------------- Wound Assessment Details Patient Name: Andrew [name] Date of Service: 03/23/2016 2:30 PM Medical Record Number: [id number] Patient Account Number: [id number] Date of Birth/Sex: July 05, 1922 (82 y.o. Male) Treating RN: Ahmed Prima Primary Care Physician: Ramonita Lab Other Clinician: Referring Physician: Ramonita Lab Treating Physician/Extender: Frann Rider in Treatment: 10 Wound Status Wound Number: 1 Primary Arterial Insufficiency Ulcer Etiology: Wound Location: Right Lower Leg - Medial Secondary Venous Leg Ulcer Wounding Event: Gradually Appeared Etiology: Date Acquired: 12/14/2015 Wound Open Weeks Of Treatment: 10 Status: Clustered Wound: No Comorbid Cataracts, Anemia, Chronic Obstructive History: Pulmonary Disease (COPD), Arrhythmia, Hypertension,  Peripheral Venous Disease, Osteoarthritis Photos Photo Uploaded By: Alric Quan on 03/23/2016 16:25:18 Wound Measurements Length: (cm) 2.7 Width: (cm) 3.5 Depth: (cm) 0.2 Area: (cm) 7.422 Volume: (cm) 1.484 % Reduction in Area: -136.2% % Reduction in Volume: -136.3% Epithelialization: Small (1-33%) Tunneling: No Undermining: No Wound Description Full Thickness Without Exposed Foul Odor Afte Classification: Support Structures Wound Margin: Distinct, outline attached Exudate Large Amount: Exudate Type: Serous Exudate Color: amber Andrew [name], [name] ([id number]) r Cleansing: No Wound Bed Granulation Amount: Small (1-33%) Exposed Structure Granulation Quality: Pink Fascia Exposed: No Necrotic Amount: Large (67-100%) Fat Layer Exposed: No Necrotic Quality: Adherent Slough Tendon Exposed: No Muscle Exposed: No Joint Exposed: No Bone Exposed: No Limited to Skin Breakdown Periwound Skin Texture Texture Color No Abnormalities Noted: No No Abnormalities Noted: No Callus: No Atrophie Blanche: No Crepitus: No Cyanosis: No Excoriation: No Ecchymosis: No Fluctuance: No Erythema: No Friable: No Hemosiderin Staining: Yes Induration: No Mottled: No Localized Edema: Yes Pallor: No Rash: No Rubor: No Scarring: No Temperature / Pain Moisture Temperature: No Abnormality No Abnormalities Noted: No Tenderness on Palpation: Yes Dry / Scaly: No Maceration: No Moist: Yes Wound Preparation Ulcer Cleansing: Rinsed/Irrigated with Saline Topical Anesthetic Applied: Other: lidocaine 4%, Treatment Notes Wound #1 (Right, Medial Lower Leg) 1. Cleansed with: Clean wound with Normal Saline 2. Anesthetic Topical Lidocaine 4% cream to wound bed prior to debridement 3. Peri-wound Care: Barrier cream 4. Dressing Applied: Medihoney Gel 5. Secondary Dressing Applied ABD Pad Dry Gauze Kerlix/Conform Andrew [name], [name] ([id number]) 7. Secured  with Tape Notes netting Electronic Signature(s) Signed: 03/24/2016 5:24:41 PM By: Alric Quan Entered By: Alric Quan on 03/23/2016 14:42:15 Andrew [name] ([id number]) -------------------------------------------------------------------------------- Vitals Details Patient Name: Andrew [name] Date of Service: 03/23/2016 2:30 PM Medical Record Number: [id number] Patient Account Number: [id number] Date of Birth/Sex: 1922/10/06 (82 y.o. Male) Treating RN: Ahmed Prima Primary Care Physician: Ramonita Lab Other Clinician: Referring Physician: Ramonita Lab Treating  Physician/Extender: Frann Rider in Treatment: 10 Vital Signs Time Taken: 14:32 Temperature (F): 97.8 Height (in): 64 Pulse (bpm): 93 Weight (lbs): 195 Respiratory Rate (breaths/min): 16 Body Mass Index (BMI): 33.5 Blood Pressure (mmHg): 133/63 Reference Range: 80 - 120 mg / dl Electronic Signature(s) Signed: 03/24/2016 5:24:41 PM By: Alric Quan Entered By: Alric Quan on 03/23/2016 14:37:03

## 2016-03-25 NOTE — Progress Notes (Signed)
[name], [name] ([id number]) Visit Report for 03/23/2016 Chief Complaint Document Details Patient Name: Andrew [name], Andrew [name] Date of Service: 03/23/2016 2:30 PM Medical Record Number: [id number] Patient Account Number: [id number] Date of Birth/Sex: [birth year] (82 y.o. Male) Treating RN: Ahmed Prima Primary Care Physician: Ramonita Lab Other Clinician: Referring Physician: Ramonita Lab Treating Physician/Extender: Frann Rider in Treatment: 10 Information Obtained from: Patient Chief Complaint Patient presents for treatment of an open ulcer due to venous insufficiency which is had on and off on the right lower extremity for several months Electronic Signature(s) Signed: 03/23/2016 3:04:17 PM By: Christin Fudge MD, FACS Entered By: Christin Fudge on 03/23/2016 15:04:17 Andrew [name] ([id number]) -------------------------------------------------------------------------------- HPI Details Patient Name: Andrew [name] Date of Service: 03/23/2016 2:30 PM Medical Record Number: [id number] Patient Account Number: [id number] Date of Birth/Sex: 12-May-1923 (82 y.o. Male) Treating RN: Ahmed Prima Primary Care Physician: Ramonita Lab Other Clinician: Referring Physician: Ramonita Lab Treating Physician/Extender: Frann Rider in Treatment: 10 History of Present Illness Location: right lower extremity ulceration Quality: Patient reports experiencing a dull pain to affected area(s). Severity: Patient states wound are getting worse. Duration: Patient has had the wound for > 3 months prior to seeking treatment at the wound center Timing: Pain in wound is Intermittent (comes and goes Context: The wound appeared gradually over time Modifying Factors: Other treatment(s) tried include:local care with hydrogen peroxide and alcohol Associated Signs and Symptoms: Patient reports having increase swelling. HPI Description: 82 year old gentleman who was seen by his PCP Dr. Kerrin Mo  for a ulceration on the right calf which she's had for several months. The patient also has past medical history significant for hypertension, COPD, chronic kidney disease stage III,anemia, bilateral leg edema, status post appendectomy and prostate surgery. he is a former smoker and has not smoked since 1994 he had a venous duplex study done of the right lower extremity in 2012 which was negative for a deep vein thrombosis. he has not had an arterial study done at anytime as per the medical records 01/27/2016 -- he recently had workup done at [facility] and his lower extremity venous duplex for reflux showed chronic deep vein thrombosis in the left femoral and popliteal veins, no evidence of lower extremity superficial thrombophlebitis bilaterally and no incompetence of the greater small saphenous veins bilaterally. Lower extremity arterial study showed resting ABI of 0.62 on the right and 1.0 on the left. Dr Lucky Cowboy saw him and recommended a right lower extremity angiogram. 02/10/2016 -- he had his procedure done on 02/07/2016 for a peripheral arterial disease of the right lower extremity and hand percutaneous transluminal angioplasty of the tibioperoneal trunk and proximal peroneal artery, right distal SFA and popliteal arteries and also had a stent placed in the right distal SFA and popliteal artery. 03/01/2016 -- -- was seen by Dr. Weldon Inches office today by Ms. Marcelle Overlie. Is made of the fact that the patient is status post right lower extremity angiogram for arterial disease on 02/07/2016. His most recent ABI shows that on the right it is 0.44 and on the left 0.86. Plan was to see Dr. Lucky Cowboy back in the office and undergo a right lower extremity arterial duplex study to access if the stent is still patent. Electronic Signature(s) Signed: 03/23/2016 3:04:24 PM By: Christin Fudge MD, FACS Entered By: Christin Fudge on 03/23/2016 15:04:23 Andrew [name], Andrew [name]  ([id number]) Andrew [name], Andrew [name] ([id number]) -------------------------------------------------------------------------------- Physical Exam Details Patient Name: Andrew [name] Date of Service: 03/23/2016 2:30 PM Medical  Record Number: [id number] Patient Account Number: [id number] Date of Birth/Sex: 03-07-1923 (82 y.o. Male) Treating RN: Ahmed Prima Primary Care Physician: Ramonita Lab Other Clinician: Referring Physician: Ramonita Lab Treating Physician/Extender: Frann Rider in Treatment: 10 Constitutional . Pulse regular. Respirations normal and unlabored. Afebrile. . Eyes Nonicteric. Reactive to light. Ears, Nose, Mouth, and Throat Lips, teeth, and gums WNL.Marland Kitchen Moist mucosa without lesions. Neck supple and nontender. No palpable supraclavicular or cervical adenopathy. Normal sized without goiter. Respiratory WNL. No retractions.. Breath sounds WNL, No rubs, rales, rhonchi, or wheeze.. Cardiovascular Heart rhythm and rate regular, no murmur or gallop.. Pedal Pulses WNL. No clubbing, cyanosis or edema. Lymphatic No adneopathy. No adenopathy. No adenopathy. Musculoskeletal Adexa without tenderness or enlargement.. Digits and nails w/o clubbing, cyanosis, infection, petechiae, ischemia, or inflammatory conditions.. Integumentary (Hair, Skin) No suspicious lesions. No crepitus or fluctuance. No peri-wound warmth or erythema. No masses.Marland Kitchen Psychiatric Judgement and insight Intact.. No evidence of depression, anxiety, or agitation.. Notes the wounds look clean and there is minimal debris which I washed out with moist saline gauze and did not use any sharp debridement today. His right posterior tibial artery has a good Doppler signal but I cannot feel anything on the dorsalis pedis. Electronic Signature(s) Signed: 03/23/2016 3:05:15 PM By: Christin Fudge MD, FACS Entered By: Christin Fudge on 03/23/2016 15:05:15 Andrew [name]  ([id number]) -------------------------------------------------------------------------------- Physician Orders Details Patient Name: Andrew [name] Date of Service: 03/23/2016 2:30 PM Medical Record Number: [id number] Patient Account Number: [id number] Date of Birth/Sex: 1922/11/19 (82 y.o. Male) Treating RN: Ahmed Prima Primary Care Physician: Ramonita Lab Other Clinician: Referring Physician: Ramonita Lab Treating Physician/Extender: Frann Rider in Treatment: 10 Verbal / Phone Orders: Yes ClinicianCarolyne Fiscal, Debi Read Back and Verified: Yes Diagnosis Coding Wound Cleansing Wound #1 Right,Medial Lower Leg o Cleanse wound with mild soap and water o May Shower, gently pat wound dry prior to applying new dressing. o May shower with protection. Skin Barriers/Peri-Wound Care Wound #1 Right,Medial Lower Leg o Barrier cream Primary Wound Dressing Wound #1 Right,Medial Lower Leg o Medihoney gel Secondary Dressing Wound #1 Right,Medial Lower Leg o ABD pad o Gauze and Kerlix/Conform - netting Dressing Change Frequency Wound #1 Right,Medial Lower Leg o Change dressing every day. Follow-up Appointments Wound #1 Right,Medial Lower Leg o Return Appointment in 1 week. Edema Control Wound #1 Right,Medial Lower Leg o Patient to wear own Juxtalite/Juzo compression garment. - Patient is non compliant . He has been educated on the importance of wearing it. o Elevate legs to the level of the heart and pump ankles as often as possible Additional Orders / Instructions Wound #1 Right,Medial Lower Leg Andrew [name], [name] ([id number]) o Increase protein intake. o Activity as tolerated o Other: - ZInc, MVI, VIT A, C Electronic Signature(s) Signed: 03/23/2016 4:25:20 PM By: Christin Fudge MD, FACS Signed: 03/24/2016 5:24:41 PM By: Alric Quan Entered By: Alric Quan on 03/23/2016 14:59:09 Andrew [name]  ([id number]) -------------------------------------------------------------------------------- Problem List Details Patient Name: Andrew [name] Date of Service: 03/23/2016 2:30 PM Medical Record Number: [id number] Patient Account Number: [id number] Date of Birth/Sex: 1922-09-23 (82 y.o. Male) Treating RN: Ahmed Prima Primary Care Physician: Ramonita Lab Other Clinician: Referring Physician: Ramonita Lab Treating Physician/Extender: Frann Rider in Treatment: 10 Active Problems ICD-10 Encounter Code Description Active Date Diagnosis I89.0 Lymphedema, not elsewhere classified 01/20/2016 Yes I73.9 Peripheral vascular disease, unspecified 01/20/2016 Yes [id number] Varicose veins of right lower extremity with both ulcer of 01/20/2016 Yes calf and inflammation I70.232 Atherosclerosis of native arteries  of right leg with 01/27/2016 Yes ulceration of calf Inactive Problems Resolved Problems Electronic Signature(s) Signed: 03/23/2016 3:04:03 PM By: Christin Fudge MD, FACS Entered By: Christin Fudge on 03/23/2016 15:04:03 Andrew [name] ([id number]) -------------------------------------------------------------------------------- Progress Note Details Patient Name: Andrew [name] Date of Service: 03/23/2016 2:30 PM Medical Record Number: [id number] Patient Account Number: [id number] Date of Birth/Sex: [birth year] (82 y.o. Male) Treating RN: Ahmed Prima Primary Care Physician: Ramonita Lab Other Clinician: Referring Physician: Ramonita Lab Treating Physician/Extender: Frann Rider in Treatment: 10 Subjective Chief Complaint Information obtained from Patient Patient presents for treatment of an open ulcer due to venous insufficiency which is had on and off on the right lower extremity for several months History of Present Illness (HPI) The following HPI elements were documented for the patient's wound: Location: right lower extremity ulceration Quality: Patient reports  experiencing a dull pain to affected area(s). Severity: Patient states wound are getting worse. Duration: Patient has had the wound for > 3 months prior to seeking treatment at the wound center Timing: Pain in wound is Intermittent (comes and goes Context: The wound appeared gradually over time Modifying Factors: Other treatment(s) tried include:local care with hydrogen peroxide and alcohol Associated Signs and Symptoms: Patient reports having increase swelling. 82 year old gentleman who was seen by his PCP Dr. Kerrin Mo for a ulceration on the right calf which she's had for several months. The patient also has past medical history significant for hypertension, COPD, chronic kidney disease stage III,anemia, bilateral leg edema, status post appendectomy and prostate surgery. he is a former smoker and has not smoked since 1994 he had a venous duplex study done of the right lower extremity in 2012 which was negative for a deep vein thrombosis. he has not had an arterial study done at anytime as per the medical records 01/27/2016 -- he recently had workup done at [facility] and his lower extremity venous duplex for reflux showed chronic deep vein thrombosis in the left femoral and popliteal veins, no evidence of lower extremity superficial thrombophlebitis bilaterally and no incompetence of the greater small saphenous veins bilaterally. Lower extremity arterial study showed resting ABI of 0.62 on the right and 1.0 on the left. Dr Lucky Cowboy saw him and recommended a right lower extremity angiogram. 02/10/2016 -- he had his procedure done on 02/07/2016 for a peripheral arterial disease of the right lower extremity and hand percutaneous transluminal angioplasty of the tibioperoneal trunk and proximal peroneal artery, right distal SFA and popliteal arteries and also had a stent placed in the right distal SFA and popliteal artery. 03/01/2016 -- -- was seen by Dr. Lucky Cowboy s office today  by Ms. Marcelle Overlie. Is made of the fact that the patient is status post right lower extremity angiogram for arterial disease on 02/07/2016. His most recent Andrew [name] ([id number]) ABI shows that on the right it is 0.44 and on the left 0.86. Plan was to see Dr. Lucky Cowboy back in the office and undergo a right lower extremity arterial duplex study to access if the stent is still patent. Objective Constitutional Pulse regular. Respirations normal and unlabored. Afebrile. Vitals Time Taken: 2:32 PM, Height: 64 in, Weight: 195 lbs, BMI: 33.5, Temperature: 97.8 F, Pulse: 93 bpm, Respiratory Rate: 16 breaths/min, Blood Pressure: 133/63 mmHg. Eyes Nonicteric. Reactive to light. Ears, Nose, Mouth, and Throat Lips, teeth, and gums WNL.Marland Kitchen Moist mucosa without lesions. Neck supple and nontender. No palpable supraclavicular or cervical adenopathy. Normal sized without goiter. Respiratory WNL. No retractions.. Breath  sounds WNL, No rubs, rales, rhonchi, or wheeze.. Cardiovascular Heart rhythm and rate regular, no murmur or gallop.. Pedal Pulses WNL. No clubbing, cyanosis or edema. Lymphatic No adneopathy. No adenopathy. No adenopathy. Musculoskeletal Adexa without tenderness or enlargement.. Digits and nails w/o clubbing, cyanosis, infection, petechiae, ischemia, or inflammatory conditions.Marland Kitchen Psychiatric Judgement and insight Intact.. No evidence of depression, anxiety, or agitation.. General Notes: the wounds look clean and there is minimal debris which I washed out with moist saline gauze and did not use any sharp debridement today. His right posterior tibial artery has a good Doppler signal but I cannot feel anything on the dorsalis pedis. Integumentary (Hair, Skin) No suspicious lesions. No crepitus or fluctuance. No peri-wound warmth or erythema. No masses.Marland Kitchen Andrew [name], [name] ([id number]) Wound #1 status is Open. Original cause of wound was Gradually Appeared. The wound is located on  the Right,Medial Lower Leg. The wound measures 2.7cm length x 3.5cm width x 0.2cm depth; 7.422cm^2 area and 1.484cm^3 volume. The wound is limited to skin breakdown. There is no tunneling or undermining noted. There is a large amount of serous drainage noted. The wound margin is distinct with the outline attached to the wound base. There is small (1-33%) pink granulation within the wound bed. There is a large (67-100%) amount of necrotic tissue within the wound bed including Adherent Slough. The periwound skin appearance exhibited: Localized Edema, Moist, Hemosiderin Staining. The periwound skin appearance did not exhibit: Callus, Crepitus, Excoriation, Fluctuance, Friable, Induration, Rash, Scarring, Dry/Scaly, Maceration, Atrophie Blanche, Cyanosis, Ecchymosis, Mottled, Pallor, Rubor, Erythema. Periwound temperature was noted as No Abnormality. The periwound has tenderness on palpation. 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 May shower with protection. Skin Barriers/Peri-Wound Care: Wound #1 Right,Medial Lower Leg: Barrier cream Primary Wound Dressing: Wound #1 Right,Medial Lower Leg: Medihoney gel Secondary Dressing: Wound #1 Right,Medial Lower Leg: ABD pad Gauze and Kerlix/Conform - netting Dressing Change Frequency: Wound #1 Right,Medial Lower Leg: Change dressing every day. Andrew [name], Andrew [name] ([id number]) Follow-up Appointments: Wound #1 Right,Medial Lower Leg: Return Appointment in 1 week. Edema Control: Wound #1 Right,Medial Lower Leg: Patient to wear own  Juxtalite/Juzo compression garment. - Patient is non compliant . He has been educated on the importance of wearing it. Elevate legs to the level of the heart and pump ankles as often as possible Additional Orders / Instructions: Wound #1 Right,Medial Lower Leg: Increase protein intake. Activity as tolerated Other: - ZInc, MVI, VIT A, C We will continue to treat him with: 1. medihoney to be applied to the wound and apply light Kerlix dressing over this 2. Elevation and exercise 3. regular visits the wound center 4. I have recommended that he see the vascular office to schedule his follow up as planned -- the appointment is on Oct 3. Electronic Signature(s) Signed: 03/23/2016 3:06:06 PM By: Christin Fudge MD, FACS Entered By: Christin Fudge on 03/23/2016 15:06:06 Andrew [name] ([id number]) -------------------------------------------------------------------------------- SuperBill Details Patient Name: Andrew [name] Date of Service: 03/23/2016 Medical Record Number: [id number] Patient Account Number: [id number] Date of Birth/Sex: 02-Feb-1923 (82 y.o. 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 I70.232 Atherosclerosis of native arteries of right leg with ulceration of calf Facility Procedures CPT4 Code: [id number] Description: 99213 - WOUND CARE VISIT-LEV 3 EST PT Modifier: Quantity: 1 Physician Procedures CPT4: Description Modifier Quantity Code [id number] - WC PHYS LEVEL 3 - EST PT 1 ICD-10 Description Diagnosis I89.0 Lymphedema, not elsewhere classified I73.9 Peripheral vascular disease, unspecified I83.212  Varicose veins of right lower extremity  with both ulcer of calf and inflammation I70.232 Atherosclerosis of native arteries of right leg with ulceration of calf Electronic Signature(s) Signed: 03/23/2016 4:25:20 PM By: Christin Fudge MD, FACS Signed: 03/24/2016 5:24:41 PM By: Alric Quan Previous Signature: 03/23/2016 3:07:19 PM Version By: Christin Fudge MD, FACS Entered By: Alric Quan on 03/23/2016 15:20:48

## 2016-03-27 ENCOUNTER — Inpatient Hospital Stay
Admission: EM | Admit: 2016-03-27 | Discharge: 2016-03-31 | DRG: 271 | Disposition: A | Discharge status: Skilled Nursing Facility | Payer: Medicare Other | Attending: Specialist | Admitting: Specialist

## 2016-03-27 ENCOUNTER — Other Ambulatory Visit: Payer: Self-pay

## 2016-03-27 ENCOUNTER — Emergency Department: Payer: Medicare Other

## 2016-03-27 ENCOUNTER — Other Ambulatory Visit: Payer: Self-pay | Admitting: Vascular Surgery

## 2016-03-27 ENCOUNTER — Encounter: Payer: Self-pay | Admitting: Emergency Medicine

## 2016-03-27 ENCOUNTER — Encounter
Admission: EM | Disposition: A | Discharge status: Skilled Nursing Facility | Payer: Self-pay | Source: Home / Self Care | Attending: Internal Medicine

## 2016-03-27 ENCOUNTER — Inpatient Hospital Stay: Payer: Medicare Other

## 2016-03-27 DIAGNOSIS — L97319 Non-pressure chronic ulcer of right ankle with unspecified severity: Secondary | ICD-10-CM | POA: Diagnosis present

## 2016-03-27 DIAGNOSIS — M6281 Muscle weakness (generalized): Secondary | ICD-10-CM

## 2016-03-27 DIAGNOSIS — Z881 Allergy status to other antibiotic agents status: Secondary | ICD-10-CM

## 2016-03-27 DIAGNOSIS — I998 Other disorder of circulatory system: Secondary | ICD-10-CM

## 2016-03-27 DIAGNOSIS — I743 Embolism and thrombosis of arteries of the lower extremities: Secondary | ICD-10-CM | POA: Diagnosis present

## 2016-03-27 DIAGNOSIS — I739 Peripheral vascular disease, unspecified: Secondary | ICD-10-CM | POA: Diagnosis present

## 2016-03-27 DIAGNOSIS — J449 Chronic obstructive pulmonary disease, unspecified: Secondary | ICD-10-CM | POA: Diagnosis present

## 2016-03-27 DIAGNOSIS — Z79899 Other long term (current) drug therapy: Secondary | ICD-10-CM

## 2016-03-27 DIAGNOSIS — T508X5A Adverse effect of diagnostic agents, initial encounter: Secondary | ICD-10-CM | POA: Diagnosis present

## 2016-03-27 DIAGNOSIS — N183 Chronic kidney disease, stage 3 (moderate): Secondary | ICD-10-CM | POA: Diagnosis present

## 2016-03-27 DIAGNOSIS — I129 Hypertensive chronic kidney disease with stage 1 through stage 4 chronic kidney disease, or unspecified chronic kidney disease: Secondary | ICD-10-CM | POA: Diagnosis present

## 2016-03-27 DIAGNOSIS — L03115 Cellulitis of right lower limb: Secondary | ICD-10-CM | POA: Diagnosis present

## 2016-03-27 DIAGNOSIS — K219 Gastro-esophageal reflux disease without esophagitis: Secondary | ICD-10-CM | POA: Diagnosis present

## 2016-03-27 DIAGNOSIS — Z7982 Long term (current) use of aspirin: Secondary | ICD-10-CM | POA: Diagnosis not present

## 2016-03-27 DIAGNOSIS — N178 Other acute kidney failure: Secondary | ICD-10-CM | POA: Diagnosis present

## 2016-03-27 DIAGNOSIS — T368X5A Adverse effect of other systemic antibiotics, initial encounter: Secondary | ICD-10-CM | POA: Diagnosis present

## 2016-03-27 DIAGNOSIS — M79604 Pain in right leg: Secondary | ICD-10-CM

## 2016-03-27 DIAGNOSIS — Z87891 Personal history of nicotine dependence: Secondary | ICD-10-CM | POA: Diagnosis not present

## 2016-03-27 DIAGNOSIS — Z7902 Long term (current) use of antithrombotics/antiplatelets: Secondary | ICD-10-CM

## 2016-03-27 DIAGNOSIS — E039 Hypothyroidism, unspecified: Secondary | ICD-10-CM | POA: Diagnosis present

## 2016-03-27 DIAGNOSIS — L0390 Cellulitis, unspecified: Secondary | ICD-10-CM

## 2016-03-27 DIAGNOSIS — Z9114 Patient's other noncompliance with medication regimen: Secondary | ICD-10-CM | POA: Diagnosis not present

## 2016-03-27 DIAGNOSIS — Z452 Encounter for adjustment and management of vascular access device: Secondary | ICD-10-CM

## 2016-03-27 DIAGNOSIS — R262 Difficulty in walking, not elsewhere classified: Secondary | ICD-10-CM

## 2016-03-27 HISTORY — PX: PERIPHERAL VASCULAR CATHETERIZATION: SHX172C

## 2016-03-27 LAB — MRSA PCR SCREENING: MRSA by PCR: NEGATIVE

## 2016-03-27 LAB — URINALYSIS COMPLETE WITH MICROSCOPIC (ARMC ONLY)
BILIRUBIN URINE: NEGATIVE
Bacteria, UA: NONE SEEN
GLUCOSE, UA: NEGATIVE mg/dL
HGB URINE DIPSTICK: NEGATIVE
KETONES UR: NEGATIVE mg/dL
LEUKOCYTES UA: NEGATIVE
NITRITE: NEGATIVE
Protein, ur: 100 mg/dL — AB
RBC / HPF: NONE SEEN RBC/hpf (ref 0–5)
SPECIFIC GRAVITY, URINE: 1.013 (ref 1.005–1.030)
Squamous Epithelial / LPF: NONE SEEN
pH: 6 (ref 5.0–8.0)

## 2016-03-27 LAB — CBC
HCT: 36.1 % — ABNORMAL LOW (ref 40.0–52.0)
HEMATOCRIT: 34.4 % — AB (ref 40.0–52.0)
HEMOGLOBIN: 12.1 g/dL — AB (ref 13.0–18.0)
Hemoglobin: 11.8 g/dL — ABNORMAL LOW (ref 13.0–18.0)
MCH: 32 pg (ref 26.0–34.0)
MCH: 32.5 pg (ref 26.0–34.0)
MCHC: 34.2 g/dL (ref 32.0–36.0)
MCHC: 33.5 g/dL (ref 32.0–36.0)
MCV: 95.3 fL (ref 80.0–100.0)
MCV: 95.6 fL (ref 80.0–100.0)
Platelets: 611 10*3/uL — ABNORMAL HIGH (ref 150–440)
Platelets: 824 10*3/uL — ABNORMAL HIGH (ref 150–440)
RBC: 3.61 MIL/uL — ABNORMAL LOW (ref 4.40–5.90)
RBC: 3.78 MIL/uL — AB (ref 4.40–5.90)
RDW: 16.1 % — AB (ref 11.5–14.5)
RDW: 15.7 % — ABNORMAL HIGH (ref 11.5–14.5)
WBC: 12.9 10*3/uL — ABNORMAL HIGH (ref 3.8–10.6)
WBC: 14.3 10*3/uL — ABNORMAL HIGH (ref 3.8–10.6)

## 2016-03-27 LAB — CBC WITH DIFFERENTIAL/PLATELET
BASOS PCT: 2 %
Basophils Absolute: 0.2 10*3/uL — ABNORMAL HIGH (ref 0–0.1)
EOS ABS: 0.4 10*3/uL (ref 0–0.7)
Eosinophils Relative: 4 %
HEMATOCRIT: 36.5 % — AB (ref 40.0–52.0)
Hemoglobin: 12.7 g/dL — ABNORMAL LOW (ref 13.0–18.0)
LYMPHS ABS: 1.7 10*3/uL (ref 1.0–3.6)
Lymphocytes Relative: 19 %
MCH: 32.9 pg (ref 26.0–34.0)
MCHC: 34.7 g/dL (ref 32.0–36.0)
MCV: 94.6 fL (ref 80.0–100.0)
MONO ABS: 0.7 10*3/uL (ref 0.2–1.0)
MONOS PCT: 8 %
Neutro Abs: 5.8 10*3/uL (ref 1.4–6.5)
Neutrophils Relative %: 67 %
Platelets: 795 10*3/uL — ABNORMAL HIGH (ref 150–440)
RBC: 3.86 MIL/uL — ABNORMAL LOW (ref 4.40–5.90)
RDW: 16.2 % — AB (ref 11.5–14.5)
WBC: 8.8 10*3/uL (ref 3.8–10.6)

## 2016-03-27 LAB — BASIC METABOLIC PANEL
Anion gap: 5 (ref 5–15)
BUN: 27 mg/dL — AB (ref 6–20)
CALCIUM: 9.1 mg/dL (ref 8.9–10.3)
CO2: 25 mmol/L (ref 22–32)
CREATININE: 1.59 mg/dL — AB (ref 0.61–1.24)
Chloride: 109 mmol/L (ref 101–111)
GFR calc non Af Amer: 36 mL/min — ABNORMAL LOW (ref >60)
GFR, EST AFRICAN AMERICAN: 42 mL/min — AB (ref >60)
Glucose, Bld: 112 mg/dL — ABNORMAL HIGH (ref 65–99)
Potassium: 4 mmol/L (ref 3.5–5.1)
SODIUM: 139 mmol/L (ref 135–145)

## 2016-03-27 LAB — MAGNESIUM: MAGNESIUM: 1.6 mg/dL — AB (ref 1.7–2.4)

## 2016-03-27 LAB — CREATININE, SERUM
Creatinine, Ser: 1.18 mg/dL (ref 0.61–1.24)
GFR calc Af Amer: >60 mL/min (ref >60)
GFR calc non Af Amer: 52 mL/min — ABNORMAL LOW (ref >60)

## 2016-03-27 LAB — FIBRINOGEN: Fibrinogen: 287 mg/dL (ref 210–475)

## 2016-03-27 LAB — C-REACTIVE PROTEIN: CRP: 0.6 mg/dL (ref <1.0)

## 2016-03-27 LAB — SEDIMENTATION RATE: SED RATE: 2 mm/h (ref 0–20)

## 2016-03-27 LAB — GLUCOSE, CAPILLARY: GLUCOSE-CAPILLARY: 115 mg/dL — AB (ref 65–99)

## 2016-03-27 LAB — HEPARIN LEVEL (UNFRACTIONATED): Heparin Unfractionated: <0.1 IU/mL — ABNORMAL LOW (ref 0.30–0.70)

## 2016-03-27 SURGERY — LOWER EXTREMITY ANGIOGRAPHY
Anesthesia: Moderate Sedation | Laterality: Right

## 2016-03-27 MED ORDER — TRAMADOL HCL 50 MG PO TABS
50.0000 mg | ORAL_TABLET | Freq: Four times a day (QID) | ORAL | Status: DC | PRN
  Administered 2016-03-29 (×2): 50 mg via ORAL
  Filled 2016-03-27 (×2): qty 1

## 2016-03-27 MED ORDER — PIPERACILLIN-TAZOBACTAM 3.375 G IVPB: 3.3750 g | Freq: Three times a day (TID) | INTRAVENOUS | Status: DC

## 2016-03-27 MED ORDER — MIDAZOLAM HCL 2 MG/2ML IJ SOLN
INTRAMUSCULAR | Status: DC | PRN
  Administered 2016-03-27 (×2): 1 mg via INTRAVENOUS
  Administered 2016-03-27: 2 mg via INTRAVENOUS
  Administered 2016-03-27: 1 mg via INTRAVENOUS
  Administered 2016-03-27 (×2): 2 mg via INTRAVENOUS

## 2016-03-27 MED ORDER — MORPHINE SULFATE (PF) 2 MG/ML IV SOLN
2.0000 mg | INTRAVENOUS | Status: DC | PRN
  Administered 2016-03-27 – 2016-03-28 (×4): 2 mg via INTRAVENOUS
  Filled 2016-03-27 (×4): qty 1

## 2016-03-27 MED ORDER — CLINDAMYCIN PHOSPHATE 600 MG/50ML IV SOLN
600.0000 mg | Freq: Once | INTRAVENOUS | Status: AC
  Administered 2016-03-27: 600 mg via INTRAVENOUS
  Filled 2016-03-27: qty 50

## 2016-03-27 MED ORDER — HEPARIN (PORCINE) IN NACL 100-0.45 UNIT/ML-% IJ SOLN
600.0000 [IU]/h | INTRAMUSCULAR | Status: DC
  Administered 2016-03-27: 500 [IU]/h via INTRAVENOUS
  Filled 2016-03-27: qty 250

## 2016-03-27 MED ORDER — OCUVITE-LUTEIN PO CAPS
1.0000 | ORAL_CAPSULE | Freq: Two times a day (BID) | ORAL | Status: DC
  Administered 2016-03-28 – 2016-03-31 (×7): 1 via ORAL
  Filled 2016-03-27 (×7): qty 1

## 2016-03-27 MED ORDER — FENTANYL CITRATE (PF) 100 MCG/2ML IJ SOLN
50.0000 ug | Freq: Once | INTRAMUSCULAR | Status: AC
  Administered 2016-03-27: 50 ug via INTRAVENOUS
  Filled 2016-03-27: qty 2

## 2016-03-27 MED ORDER — ONDANSETRON HCL 4 MG/2ML IJ SOLN: 4.0000 mg | Freq: Four times a day (QID) | INTRAMUSCULAR | Status: DC | PRN

## 2016-03-27 MED ORDER — SODIUM CHLORIDE 0.9 % IV SOLN: INTRAVENOUS | Status: DC

## 2016-03-27 MED ORDER — MOMETASONE FURO-FORMOTEROL FUM 200-5 MCG/ACT IN AERO
2.0000 | INHALATION_SPRAY | Freq: Two times a day (BID) | RESPIRATORY_TRACT | Status: DC
  Administered 2016-03-27 – 2016-03-31 (×7): 2 via RESPIRATORY_TRACT
  Filled 2016-03-27: qty 8.8

## 2016-03-27 MED ORDER — HEPARIN (PORCINE) IN NACL 100-0.45 UNIT/ML-% IJ SOLN
INTRAMUSCULAR | Status: AC
  Filled 2016-03-27: qty 250

## 2016-03-27 MED ORDER — HEPARIN (PORCINE) IN NACL 2-0.9 UNIT/ML-% IJ SOLN
INTRAMUSCULAR | Status: AC
  Filled 2016-03-27: qty 1000

## 2016-03-27 MED ORDER — VANCOMYCIN HCL IN DEXTROSE 1-5 GM/200ML-% IV SOLN: 1000.0000 mg | Freq: Once | INTRAVENOUS | Status: DC

## 2016-03-27 MED ORDER — HYDROCHLOROTHIAZIDE 12.5 MG PO CAPS: 12.5000 mg | ORAL_CAPSULE | ORAL | Status: DC

## 2016-03-27 MED ORDER — VITAMIN E 180 MG (400 UNIT) PO CAPS
400.0000 [IU] | ORAL_CAPSULE | ORAL | Status: DC
  Administered 2016-03-28 – 2016-03-31 (×4): 400 [IU] via ORAL
  Filled 2016-03-27 (×4): qty 1

## 2016-03-27 MED ORDER — HYDRALAZINE HCL 20 MG/ML IJ SOLN: 5.0000 mg | INTRAMUSCULAR | Status: DC | PRN

## 2016-03-27 MED ORDER — VITAMIN B-12 1000 MCG PO TABS
2500.0000 ug | ORAL_TABLET | ORAL | Status: DC
  Administered 2016-03-28 – 2016-03-31 (×4): 2500 ug via ORAL
  Filled 2016-03-27 (×4): qty 3

## 2016-03-27 MED ORDER — SODIUM CHLORIDE 0.9 % IV SOLN
0.5000 mg/h | INTRAVENOUS | Status: DC
  Administered 2016-03-27 – 2016-03-28 (×2): 0.5 mg/h
  Filled 2016-03-27 (×4): qty 10

## 2016-03-27 MED ORDER — ASPIRIN EC 81 MG PO TBEC
81.0000 mg | DELAYED_RELEASE_TABLET | Freq: Every day | ORAL | Status: DC
  Administered 2016-03-28 – 2016-03-31 (×4): 81 mg via ORAL
  Filled 2016-03-27 (×4): qty 1

## 2016-03-27 MED ORDER — NITROGLYCERIN 5 MG/ML IV SOLN
INTRAVENOUS | Status: AC
  Filled 2016-03-27: qty 10

## 2016-03-27 MED ORDER — VITAMIN D 1000 UNITS PO TABS
1000.0000 [IU] | ORAL_TABLET | ORAL | Status: DC
  Administered 2016-03-28 – 2016-03-31 (×4): 1000 [IU] via ORAL
  Filled 2016-03-27 (×4): qty 1

## 2016-03-27 MED ORDER — ATORVASTATIN CALCIUM 20 MG PO TABS
20.0000 mg | ORAL_TABLET | Freq: Every day | ORAL | Status: DC
  Administered 2016-03-28 – 2016-03-31 (×4): 20 mg via ORAL
  Filled 2016-03-27 (×4): qty 1

## 2016-03-27 MED ORDER — VANCOMYCIN HCL IN DEXTROSE 1-5 GM/200ML-% IV SOLN
1000.0000 mg | INTRAVENOUS | Status: DC
  Administered 2016-03-28 – 2016-03-31 (×4): 1000 mg via INTRAVENOUS
  Filled 2016-03-27 (×5): qty 200

## 2016-03-27 MED ORDER — IOPAMIDOL (ISOVUE-300) INJECTION 61%
INTRAVENOUS | Status: DC | PRN
  Administered 2016-03-27: 80 mL via INTRAVENOUS

## 2016-03-27 MED ORDER — FENTANYL CITRATE (PF) 100 MCG/2ML IJ SOLN
INTRAMUSCULAR | Status: AC
  Filled 2016-03-27: qty 2

## 2016-03-27 MED ORDER — HEPARIN SODIUM (PORCINE) 5000 UNIT/ML IJ SOLN: 5000.0000 [IU] | Freq: Three times a day (TID) | INTRAMUSCULAR | Status: DC

## 2016-03-27 MED ORDER — MIDAZOLAM HCL 5 MG/5ML IJ SOLN
INTRAMUSCULAR | Status: AC
  Filled 2016-03-27: qty 5

## 2016-03-27 MED ORDER — MORPHINE SULFATE (PF) 2 MG/ML IV SOLN
INTRAVENOUS | Status: AC
  Filled 2016-03-27: qty 1

## 2016-03-27 MED ORDER — CLINDAMYCIN PHOSPHATE 300 MG/50ML IV SOLN: 300.0000 mg | Freq: Once | INTRAVENOUS | Status: DC

## 2016-03-27 MED ORDER — LORATADINE 10 MG PO TABS
10.0000 mg | ORAL_TABLET | Freq: Every day | ORAL | Status: DC
  Administered 2016-03-28 – 2016-03-31 (×4): 10 mg via ORAL
  Filled 2016-03-27 (×7): qty 1

## 2016-03-27 MED ORDER — SODIUM CHLORIDE 0.9 % IV SOLN: 250.0000 mL | INTRAVENOUS | Status: DC | PRN

## 2016-03-27 MED ORDER — DEXMEDETOMIDINE HCL IN NACL 400 MCG/100ML IV SOLN
0.4000 ug/kg/h | INTRAVENOUS | Status: DC
  Administered 2016-03-27: 0.4 ug/kg/h via INTRAVENOUS
  Administered 2016-03-28: 0.6 ug/kg/h via INTRAVENOUS
  Filled 2016-03-27 (×2): qty 100

## 2016-03-27 MED ORDER — SODIUM CHLORIDE 0.9% FLUSH
3.0000 mL | INTRAVENOUS | Status: DC | PRN
  Administered 2016-03-29: 3 mL via INTRAVENOUS
  Filled 2016-03-27: qty 3

## 2016-03-27 MED ORDER — SODIUM CHLORIDE 0.9% FLUSH
3.0000 mL | Freq: Two times a day (BID) | INTRAVENOUS | Status: DC
  Administered 2016-03-28 – 2016-03-31 (×7): 3 mL via INTRAVENOUS

## 2016-03-27 MED ORDER — MIDAZOLAM HCL 2 MG/2ML IJ SOLN: 1.0000 mg | INTRAMUSCULAR | Status: DC | PRN

## 2016-03-27 MED ORDER — PANTOPRAZOLE SODIUM 40 MG PO TBEC
40.0000 mg | DELAYED_RELEASE_TABLET | Freq: Every day | ORAL | Status: DC
  Administered 2016-03-28 – 2016-03-31 (×4): 40 mg via ORAL
  Filled 2016-03-27 (×4): qty 1

## 2016-03-27 MED ORDER — ALTEPLASE 2 MG IJ SOLR
INTRAMUSCULAR | Status: AC
  Filled 2016-03-27: qty 4

## 2016-03-27 MED ORDER — MIDAZOLAM HCL 2 MG/2ML IJ SOLN
INTRAMUSCULAR | Status: AC
  Filled 2016-03-27: qty 2

## 2016-03-27 MED ORDER — ALBUTEROL SULFATE (2.5 MG/3ML) 0.083% IN NEBU: 5.0000 mg | INHALATION_SOLUTION | Freq: Once | RESPIRATORY_TRACT | Status: DC

## 2016-03-27 MED ORDER — SODIUM CHLORIDE 0.9 % IJ SOLN
INTRAMUSCULAR | Status: AC
  Filled 2016-03-27: qty 50

## 2016-03-27 MED ORDER — VANCOMYCIN HCL IN DEXTROSE 1-5 GM/200ML-% IV SOLN
1000.0000 mg | Freq: Once | INTRAVENOUS | Status: AC
  Administered 2016-03-27: 1000 mg via INTRAVENOUS
  Filled 2016-03-27: qty 200

## 2016-03-27 MED ORDER — LIDOCAINE-EPINEPHRINE (PF) 1 %-1:200000 IJ SOLN
INTRAMUSCULAR | Status: AC
Start: 2016-03-27 — End: 2016-03-27
  Filled 2016-03-27: qty 30

## 2016-03-27 MED ORDER — HEPARIN SODIUM (PORCINE) 1000 UNIT/ML IJ SOLN
INTRAMUSCULAR | Status: AC
  Filled 2016-03-27: qty 1

## 2016-03-27 MED ORDER — LOSARTAN POTASSIUM 25 MG PO TABS
25.0000 mg | ORAL_TABLET | ORAL | Status: DC
  Administered 2016-03-29 – 2016-03-31 (×3): 25 mg via ORAL
  Filled 2016-03-27 (×3): qty 1

## 2016-03-27 MED ORDER — LABETALOL HCL 5 MG/ML IV SOLN: 10.0000 mg | INTRAVENOUS | Status: DC | PRN

## 2016-03-27 MED ORDER — FENTANYL CITRATE (PF) 100 MCG/2ML IJ SOLN
INTRAMUSCULAR | Status: DC | PRN
Start: 2016-03-27 — End: 2016-03-27
  Administered 2016-03-27: 50 ug via INTRAVENOUS
  Administered 2016-03-27: 25 ug via INTRAVENOUS
  Administered 2016-03-27 (×3): 50 ug via INTRAVENOUS
  Administered 2016-03-27: 25 ug via INTRAVENOUS
  Administered 2016-03-27 (×3): 50 ug via INTRAVENOUS

## 2016-03-27 MED ORDER — PIPERACILLIN-TAZOBACTAM 3.375 G IVPB
3.3750 g | Freq: Three times a day (TID) | INTRAVENOUS | Status: DC
  Administered 2016-03-27 – 2016-03-29 (×5): 3.375 g via INTRAVENOUS
  Filled 2016-03-27 (×5): qty 50

## 2016-03-27 MED ORDER — FLUTICASONE PROPIONATE 50 MCG/ACT NA SUSP
2.0000 | Freq: Every day | NASAL | Status: DC
  Administered 2016-03-27 – 2016-03-31 (×5): 2 via NASAL
  Filled 2016-03-27: qty 16

## 2016-03-27 MED ORDER — HYDROMORPHONE HCL 1 MG/ML IJ SOLN: 1.0000 mg | Freq: Once | INTRAMUSCULAR | Status: DC

## 2016-03-27 MED ORDER — LEVOTHYROXINE SODIUM 75 MCG PO TABS
75.0000 ug | ORAL_TABLET | Freq: Every day | ORAL | Status: DC
  Administered 2016-03-29 – 2016-03-31 (×3): 75 ug via ORAL
  Filled 2016-03-27 (×3): qty 1

## 2016-03-27 MED ORDER — MORPHINE SULFATE (PF) 4 MG/ML IV SOLN: 5.0000 mg | INTRAVENOUS | Status: DC | PRN

## 2016-03-27 MED ORDER — TIOTROPIUM BROMIDE MONOHYDRATE 18 MCG IN CAPS
18.0000 ug | ORAL_CAPSULE | Freq: Every day | RESPIRATORY_TRACT | Status: DC
  Administered 2016-03-29 – 2016-03-31 (×3): 18 ug via RESPIRATORY_TRACT
  Filled 2016-03-27: qty 5

## 2016-03-27 MED ORDER — HEPARIN SODIUM (PORCINE) 1000 UNIT/ML IJ SOLN
INTRAMUSCULAR | Status: DC | PRN
  Administered 2016-03-27: 4000 [IU] via INTRAVENOUS

## 2016-03-27 SURGICAL SUPPLY — 25 items
BALLN LUTONIX 6X150X130 (BALLOONS) ×4
BALLN ULTRVRSE 3X200X130 (BALLOONS) ×4
BALLN ULTRVRSE 4X300X150 (BALLOONS) ×4
BALLN ULTRVRSE 6X220X130 (BALLOONS) ×4
BALLOON LUTONIX 6X150X130 (BALLOONS) ×2 IMPLANT
BALLOON ULTRVRSE 3X200X130 (BALLOONS) ×2 IMPLANT
BALLOON ULTRVRSE 4X300X150 (BALLOONS) ×2 IMPLANT
BALLOON ULTRVRSE 6X220X130 (BALLOONS) ×2 IMPLANT
CATH CXI SUPP ANG 4FR 135 (MICROCATHETER) ×2 IMPLANT
CATH CXI SUPP ANG 4FR 135CM (MICROCATHETER) ×4
CATH CXI SUPP ST 4FR 135CM (MICROCATHETER) ×4 IMPLANT
CATH INFUS 135CMX50CM (CATHETERS) ×4 IMPLANT
CATH PIG 70CM (CATHETERS) ×4 IMPLANT
CATH VERT 100CM (CATHETERS) ×4 IMPLANT
DEVICE PRESTO INFLATION (MISCELLANEOUS) ×4 IMPLANT
DEVICE SOLENT OMNI 120CM (CATHETERS) ×4 IMPLANT
GLIDEWIRE ADV .035X260CM (WIRE) ×4 IMPLANT
KIT CATH CVC 3 LUMEN 7FR 8IN (MISCELLANEOUS) ×4 IMPLANT
PACK ANGIOGRAPHY (CUSTOM PROCEDURE TRAY) ×4 IMPLANT
SHEATH ANL2 6FRX45 HC (SHEATH) ×4 IMPLANT
SHEATH BRITE TIP 5FRX11 (SHEATH) ×4 IMPLANT
SYR MEDRAD MARK V 150ML (SYRINGE) ×4 IMPLANT
TUBING CONTRAST HIGH PRESS 72 (TUBING) ×4 IMPLANT
WIRE G V18X300CM (WIRE) ×4 IMPLANT
WIRE J 3MM .035X145CM (WIRE) ×4 IMPLANT

## 2016-03-27 NOTE — Progress Notes (Signed)
Patient for Right Lower Extremity Angiogram around 3:00-3:30pm today with Dr. Lucky Cowboy

## 2016-03-27 NOTE — Progress Notes (Signed)
ANTICOAGULATION CONSULT NOTE - Initial Consult  Pharmacy Consult for heparin drip monitoring Indication: VTE treatment  Allergies  Allergen Reactions  . Keflex [Cephalexin] Other (See Comments)    Patient states this medication made his legs hurt really bad.    Patient Measurements: Height: 5\' 11"  (180.3 cm) Weight: 179 lb 3.7 oz (81.3 kg) IBW/kg (Calculated) : 75.3 Heparin Dosing Weight: 81 kg  Vital Signs: Temp: 97.7 F (36.5 C) (09/25 1840) Temp Source: Oral (09/25 1840) BP: 144/84 (09/25 1900) Pulse Rate: 99 (09/25 1900)  Labs:  Recent Labs  03/27/16 0829  HGB 12.7*  HCT 36.5*  PLT 795*  CREATININE 1.59*    Estimated Creatinine Clearance: 32.2 mL/min (by C-G formula based on SCr of 1.59 mg/dL (H)).   Medical History: Past Medical History:  Diagnosis Date  . Asthma   . Chronic kidney disease   . Hypertension   . Peripheral vascular disease (HCC)     Medications:  Scheduled:  . albuterol  5 mg Nebulization Once  . [START ON 03/28/2016] aspirin EC  81 mg Oral Daily  . [START ON 03/28/2016] atorvastatin  20 mg Oral Daily  . [START ON 03/28/2016] cholecalciferol  1,000 Units Oral BH-q7a  . fluticasone  2 spray Each Nare Daily  . heparin      . [START ON 03/28/2016] hydrochlorothiazide  12.5 mg Oral BH-q7a  . [START ON 03/28/2016] levothyroxine  75 mcg Oral QAC breakfast  . [START ON 03/28/2016] loratadine  10 mg Oral Daily  . [START ON 03/28/2016] losartan  25 mg Oral BH-q7a  . mometasone-formoterol  2 puff Inhalation BID  . morphine      . multivitamin-lutein  1 capsule Oral BID  . [START ON 03/28/2016] pantoprazole  40 mg Oral Daily  . piperacillin-tazobactam (ZOSYN)  IV  3.375 g Intravenous Q8H  . sodium chloride flush  3 mL Intravenous Q12H  . [START ON 03/28/2016] tiotropium  18 mcg Inhalation Daily  . vancomycin  1,000 mg Intravenous Once  . [START ON 03/28/2016] vitamin B-12  2,500 mcg Oral BH-q7a  . [START ON 03/28/2016] vitamin E  400 Units Oral BH-q7a    Infusions:  . alteplase (LIMB ISCHEMIA) 10 mg in normal saline (0.02 mg/mL) infusion 0.5 mg/hr (03/27/16 1821)  . dexmedetomidine    . heparin 500 Units/hr (03/27/16 1710)    Assessment: Patient admitted with ischemic right leg and underwent angiogram today. Patient currently has orders for heparin and alteplase per vascular.   Orders entered for heparin level to be checked q6h  Goal of Therapy:  Heparin level 0.2 - 0.5 units/ml Monitor platelets by anticoagulation protocol: Yes   Plan: Continue heparin drip at current rate of 500 units/hr and check heparin level in 6 hours.  Lenis Noon, PharmD 03/27/2016,7:25 PM

## 2016-03-27 NOTE — Consult Note (Signed)
[facility] Vascular Consult Note  MRN : [id number]  Andrew [name] is a 82 y.o. ([birth year]) male who presents with chief complaint of  Chief Complaint  Patient presents with  . Shortness of Breath  . Leg Pain   History of Present Illness: The patient is a 82 year old male well know to our practice. He endorses a history of undergoing a right lower extremity angiogram with angioplasty and stent place on 02/07/16 by Dr. Lucky Cowboy for PAD with ulceration. He has been treated by Dr. Con Memos over the last few weeks receiving local wound care and hyperbaric oxygen therapy. He states a week of working right lower extremity pain, swelling and redness. The pain has worsened to the point he sought medical attention at [facility] ED today. He was given Keflex for presumed cellulitis however he felt it made the pain worse. Denies any alleviating factors. Denies any fever, nausea or vomiting.  Consulted by primary team, Dr. Anselm Jungling for recurrent cellulitis and ulceration.  Current Facility-Administered Medications  Medication Dose Route Frequency Provider Last Rate Last Dose  . heparin 100-0.45 UNIT/ML-% infusion           . morphine 2 MG/ML injection           . 0.9 %  sodium chloride infusion   Intravenous Continuous Paije Goodhart A Mallisa Alameda, PA-C      . 0.9 %  sodium chloride infusion  250 mL Intravenous PRN Algernon Huxley, MD      . Doug Sou Hold] albuterol (PROVENTIL) (2.5 MG/3ML) 0.083% nebulizer solution 5 mg  5 mg Nebulization Once Eula Listen, MD      . alteplase (LIMB ISCHEMIA) 10 mg in normal saline (0.02 mg/mL) infusion  0.5 mg/hr Intracatheter Continuous Algernon Huxley, MD      . aspirin EC tablet 81 mg  81 mg Oral Daily Vaughan Basta, MD      . atorvastatin (LIPITOR) tablet 20 mg  20 mg Oral Daily Vaughan Basta, MD      . Derrill Memo ON 03/28/2016] Cholecalciferol 1,000 Units  1,000 Units Oral [id number] Vaughan Basta, MD      . fluticasone (VERAMYST) nasal spray 2  spray  2 spray Nasal Daily Vaughan Basta, MD      . heparin ADULT infusion 100 units/mL (25000 units/238mL sodium chloride 0.45%)  500 Units/hr Intravenous Continuous Algernon Huxley, MD      . hydrALAZINE (APRESOLINE) injection 5 mg  5 mg Intravenous Q20 Min PRN Algernon Huxley, MD      . Derrill Memo ON 03/28/2016] hydrochlorothiazide (MICROZIDE) capsule 12.5 mg  12.5 mg Oral BH-q7a Vaughan Basta, MD      . labetalol (NORMODYNE,TRANDATE) injection 10 mg  10 mg Intravenous Q10 min PRN Algernon Huxley, MD      . Derrill Memo ON 03/28/2016] levothyroxine (SYNTHROID, LEVOTHROID) tablet 75 mcg  75 mcg Oral QAC breakfast Vaughan Basta, MD      . loratadine (CLARITIN) tablet 10 mg  10 mg Oral Daily Vaughan Basta, MD      . Derrill Memo ON 03/28/2016] losartan (COZAAR) tablet 25 mg  25 mg Oral BH-q7a Vaughan Basta, MD      . midazolam (VERSED) injection 1 mg  1 mg Intravenous Q1H PRN Algernon Huxley, MD      . mometasone-formoterol (DULERA) 200-5 MCG/ACT inhaler 2 puff  2 puff Inhalation BID Vaughan Basta, MD      . morphine 2 MG/ML injection 2 mg  2 mg Intravenous Q1H PRN  Algernon Huxley, MD   2 mg at 03/27/16 1745  . ondansetron (ZOFRAN) injection 4 mg  4 mg Intravenous Q6H PRN Algernon Huxley, MD      . pantoprazole (PROTONIX) EC tablet 40 mg  40 mg Oral Daily Vaughan Basta, MD      . Doug Sou Hold] piperacillin-tazobactam (ZOSYN) IVPB 3.375 g  3.375 g Intravenous [address] [place], [employer]      . PRESERVISION/LUTEIN CAPS 1 capsule  1 capsule Oral BID Vaughan Basta, MD      . sodium chloride flush (NS) 0.9 % injection 3 mL  3 mL Intravenous Q12H Algernon Huxley, MD      . sodium chloride flush (NS) 0.9 % injection 3 mL  3 mL Intravenous PRN Algernon Huxley, MD      . tiotropium [facility]) inhalation capsule 18 mcg  18 mcg Inhalation Daily Vaughan Basta, MD      . traMADol (ULTRAM) tablet 50 mg  50 mg Oral Q6H PRN Vaughan Basta, MD      . Doug Sou Hold] vancomycin (VANCOCIN) IVPB 1000  mg/200 mL premix  1,000 mg Intravenous Once Lenis Noon, [facility]      . [START ON 03/28/2016] Vitamin B-12 SUBL 2,500 mcg  2,500 mcg Sublingual Martin Majestic, MD      . Derrill Memo ON 03/28/2016] vitamin E capsule 400 Units  400 Units Oral [id number] Vaughan Basta, MD        Past Medical History:  Diagnosis Date  . Asthma   . Chronic kidney disease   . Hypertension   . Peripheral vascular disease [facility])     Past Surgical History:  Procedure Laterality Date  . PERIPHERAL VASCULAR CATHETERIZATION Right 02/07/2016   Procedure: Lower Extremity Angiography;  Surgeon: Algernon Huxley, MD;  Location: [place] [facility];  Service: Cardiovascular;  Laterality: Right;  . SHOULDER SURGERY  1974  . UPPER GI ENDOSCOPY      Social History Social History  Substance Use Topics  . Smoking status: Former Research scientist (life sciences)  . Smokeless tobacco: Former Systems developer    Quit date: 05/16/1993  . Alcohol use No    Family History Family History  Problem Relation Age of Onset  . Heart failure Mother   . Stroke Father   Denies family history of PAD, Renal or venous disease  Allergies  Allergen Reactions  . Keflex [Cephalexin] Other (See Comments)    Patient states this medication made his legs hurt really bad.     REVIEW OF SYSTEMS (Negative unless checked)  Constitutional: [] Weight loss  [] Fever  [] Chills Cardiac: [] Chest pain   [] Chest pressure   [] Palpitations   [] Shortness of breath when laying flat   [] Shortness of breath at rest   [] Shortness of breath with exertion. Vascular:  [x] Pain in legs with walking   [x] Pain in legs at rest   [x] Pain in legs when laying flat   [x] Claudication   [] Pain in feet when walking  [] Pain in feet at rest  [] Pain in feet when laying flat   [] History of DVT   [] Phlebitis   [x] Swelling in legs   [] Varicose veins   [x] Non-healing ulcers Pulmonary:   [] Uses home oxygen   [] Productive cough   [] Hemoptysis   [] Wheeze  [] COPD   [] Asthma Neurologic:  [] Dizziness  [] Blackouts    [] Seizures   [] History of stroke   [] History of TIA  [] Aphasia   [] Temporary blindness   [] Dysphagia   [] Weakness or numbness in arms   []   Weakness or numbness in legs Musculoskeletal:  [] Arthritis   [] Joint swelling   [] Joint pain   [] Low back pain Hematologic:  [] Easy bruising  [] Easy bleeding   [] Hypercoagulable state   [] Anemic  [] Hepatitis Gastrointestinal:  [] Blood in stool   [] Vomiting blood  [] Gastroesophageal reflux/heartburn   [] Difficulty swallowing. Genitourinary:  [] Chronic kidney disease   [] Difficult urination  [] Frequent urination  [] Burning with urination   [] Blood in urine Skin:  [] Rashes   [x] Ulcers   [x] Wounds Psychological:  [] History of anxiety   []  History of major depression.  Physical Examination  Vitals:   03/27/16 1400 03/27/16 1441 03/27/16 1703 03/27/16 1722  BP: (!) 149/90 (!) 160/101 (!) 186/98 (!) 164/95  Pulse: 95 95 99 (!) 106  Resp: 16 18 20 19   Temp:      TempSrc:      SpO2: 97% 97% 98% 100%  Weight:  83 kg (183 lb)    Height:  5\' 11"  (1.803 m)     Body mass index is 25.52 kg/m. Gen:  WD/WN, NAD Head: Berger/AT, No temporalis wasting. Prominent temp pulse not noted. Ear/Nose/Throat: Hearing grossly intact, nares w/o erythema or drainage, oropharynx w/o Erythema/Exudate Eyes: PERRLA, EOMI.  Neck: Supple, no nuchal rigidity.  No bruit or JVD.  Pulmonary:  Good air movement, clear to auscultation bilaterally.  Cardiac: RRR, normal S1, S2, no Murmurs, rubs or gallops. Vascular:  Vessel Right Left  Radial Palpable Palpable  Ulnar Palpable Palpable  Brachial Palpable Palpable  Carotid Palpable, without bruit Palpable, without bruit  Aorta Not palpable N/A  Femoral Palpable Palpable  Popliteal Non-Palpable Palpable  PT Non-Palpable Palpable  DP Non-Palpable Palpable   Right Lower Extremity: Moderate edema noted. Ankle ulceration noted - non-infected, granulation tissue present. Distal aspect of extremity with erythema and tender to  palpation.  Gastrointestinal: soft, non-tender/non-distended. No guarding/reflex. No masses, surgical incisions, or scars. Musculoskeletal: M/S 5/5 throughout.  Extremities without ischemic changes.  No deformity or atrophy. Neurologic: CN 2-12 intact. Pain and light touch intact in extremities.  Symmetrical.  Speech is fluent. Motor exam as listed above. Psychiatric: Judgment intact, Mood & affect appropriate for pt's clinical situation. Lymph : No Cervical, Axillary, or Inguinal lymphadenopathy.  CBC Lab Results  Component Value Date   WBC 8.8 03/27/2016   HGB 12.7 (L) 03/27/2016   HCT 36.5 (L) 03/27/2016   MCV 94.6 03/27/2016   PLT 795 (H) 03/27/2016    BMET    Component Value Date/Time   NA 139 03/27/2016 0829   K 4.0 03/27/2016 0829   CL 109 03/27/2016 0829   CO2 25 03/27/2016 0829   GLUCOSE 112 (H) 03/27/2016 0829   BUN 27 (H) 03/27/2016 0829   CREATININE 1.59 (H) 03/27/2016 0829   CALCIUM 9.1 03/27/2016 0829   GFRNONAA 36 (L) 03/27/2016 0829   GFRAA 42 (L) 03/27/2016 0829   Estimated Creatinine Clearance: 32.2 mL/min (by C-G formula based on SCr of 1.59 mg/dL (H)).  COAG No results found for: INR, PROTIME  Radiology Dg Chest 2 View  Result Date: 03/27/2016 CLINICAL DATA:  Shortness of breath, right lower extremity wound undergoing care inter wound care facility. EXAM: CHEST  2 VIEW COMPARISON:  CT scan of the chest of September 22, 2010 FINDINGS: The lungs are mildly hyperinflated with hemidiaphragm flattening. There is no focal infiltrate. The interstitial markings are coarse. There are chronically increased lung markings in the left perihilar region. The heart is top-normal in size. The pulmonary vascularity is not engorged. The mediastinum  is normal in width. There is multilevel degenerative disc disease of the thoracic spine. There is calcification in the wall of the thoracic aorta. IMPRESSION: COPD. Chronic scarring or other stable process in the left upper lobe  inferiorly. Top-normal cardiac size without pulmonary edema. Aortic atherosclerosis. Electronically Signed   By: David  Martinique M.D.   On: 03/27/2016 09:34   Dg Tibia/fibula Right  Result Date: 03/27/2016 CLINICAL DATA:  82 year old male with right lower leg wound and extremity swelling. Initial encounter. EXAM: RIGHT TIBIA AND FIBULA - 2 VIEW COMPARISON:  None. FINDINGS: Long segment vascular stenting in the distal right thigh extending to the popliteal fossa. Other calcified peripheral vascular disease in the proximal calf. Widespread subcutaneous stranding and soft tissue swelling. No subcutaneous gas. The reported discrete soft tissue wound may be along the distal tibia just above the medial malleolus on the AP view. Bone mineralization is within normal limits for age. Alignment at the right knee and ankle is preserved. Degenerative changes at the joints. No acute osseous abnormality identified. IMPRESSION: 1.  No acute osseous abnormality about the right tib-fib. 2. Soft tissue swelling and evidence of widespread subcutaneous edema but no subcutaneous gas identified. Electronically Signed   By: Genevie Ann M.D.   On: 03/27/2016 10:22   US Venous Img Lower Unilateral Right  Result Date: 03/27/2016 CLINICAL DATA:  82 year old male with a history of swelling EXAM: RIGHT LOWER EXTREMITY VENOUS DOPPLER ULTRASOUND TECHNIQUE: Gray-scale sonography with graded compression, as well as color Doppler and duplex ultrasound were performed to evaluate the lower extremity deep venous systems from the level of the common femoral vein and including the common femoral, femoral, profunda femoral, popliteal and calf veins including the posterior tibial, peroneal and gastrocnemius veins when visible. The superficial great saphenous vein was also interrogated. Spectral Doppler was utilized to evaluate flow at rest and with distal augmentation maneuvers in the common femoral, femoral and popliteal veins. COMPARISON:  None.  FINDINGS: Contralateral Common Femoral Vein: Respiratory phasicity is normal and symmetric with the symptomatic side. No evidence of thrombus. Normal compressibility. Common Femoral Vein: No evidence of thrombus. Normal compressibility, respiratory phasicity and response to augmentation. Saphenofemoral Junction: No evidence of thrombus. Normal compressibility and flow on color Doppler imaging. Profunda Femoral Vein: No evidence of thrombus. Normal compressibility and flow on color Doppler imaging. Femoral Vein: No evidence of thrombus. Normal compressibility, respiratory phasicity and response to augmentation. Popliteal Vein: No evidence of thrombus. Normal compressibility, respiratory phasicity and response to augmentation. Calf Veins: No evidence of thrombus. Normal compressibility and flow on color Doppler imaging. Superficial Great Saphenous Vein: No evidence of thrombus. Normal compressibility and flow on color Doppler imaging. Other Findings:  None. IMPRESSION: Sonographic survey of the right lower extremity negative for DVT. Signed, Dulcy Fanny. Earleen Newport, DO [facility] Electronically Signed   By: Corrie Mckusick D.O.   On: 03/27/2016 11:15   Dg Foot Complete Right  Result Date: 03/27/2016 CLINICAL DATA:  82 year old male with right lower leg wound and extremity swelling. Initial encounter. EXAM: RIGHT FOOT COMPLETE - 3+ VIEW COMPARISON:  Right foot series 03/10/2011. Right tib-fib series from today reported separately. FINDINGS: Bone mineralization remains normal for age. Right foot osseous structures appear stable and intact. Suggestion of soft tissue wound proximal to the medial malleolus again noted (image 2). No subcutaneous gas in the foot. No acute osseous abnormality identified. IMPRESSION: No acute osseous abnormality in the right foot. Electronically Signed   By: Lemmie Evens  [place] Crane M.D.   On: 03/27/2016 10:23   Assessment/Plan 82 year old male with PMHx   Of CKD, HTN, Asthma and PAD presents with worsening right lower extremity pain. 1) PAD s/p recent intervention - lack of pulses in extremity worrisome for possible occlusion of stent. Recommend right lower extremity angiogram ASAP. Procedure, risk and benefits explained to patient. All questions answered. Patient wishes to proceed.  2) Right Lower Extremity Cellulitis - Agree with IV ABX.  3) Discussed with Dr. Mayme Genta, PA-C  03/27/2016 6:00 PM

## 2016-03-27 NOTE — ED Triage Notes (Signed)
Patient called EMS today for c/o SOB.  Patient has a wound to right lower leg that he is being treated for at the wound care center and has been prescribed Keflex, which patient is not taking because it made him feel bad.  Patient because of pain to right lower leg wound.  Patient denies current complaint of SOB.

## 2016-03-27 NOTE — Op Note (Signed)
[facility] Percutaneous Study/Intervention Procedural Note   Date of Surgery: 03/27/2016  Surgeon(s):DEW,JASON   Assistants:none  Pre-operative Diagnosis: PAD with ulceration and infection right lower extremity  Post-operative diagnosis: Same  Procedure(s) Performed: 1. Ultrasound guidance for vascular access left femoral artery 2. Catheter placement into right posterior tibial artery and peroneal arteries from left femoral approach 3. Aortogram and selective right lower extremity angiogram 4. Catheter directed thrombolytic therapy with 4 mg of TPA instilled in the right SFA, popliteal artery, tibioperoneal trunk, and peroneal arteries 5. Mechanical rheolytic thrombectomy with the AngioJet Omni catheter in the right SFA, popliteal artery, tibioperoneal trunk, peroneal artery, and posterior tibial artery.  6.  Percutaneous transluminal angioplasty of the peroneal artery and tibioperoneal trunk with 3 mm diameter angioplasty balloon 7. Percutaneous transluminal angioplasty of the entire SFA and popliteal artery with 6 mm diameter angioplasty balloon including a Lutonix drug-coated balloon proximally  8.  Percutaneous transluminal angioplasty of the posterior tibial artery and tibioperoneal trunk with 3 mm diameter angioplasty balloon  9.  Placement of a thrombolytic catheter for continuous infusion of TPA using a 130 centimeters total length 50 cm working length catheter parked from the mid SFA down through the popliteal artery and into the tibioperoneal trunk and down to the mid posterior tibial artery.  EBL: 25 cc  Contrast: 80 cc  Fluoro Time: 20.9 minutes  Moderate Conscious Sedation Time: approximately 100 minutes using 9 mg of Versed and 400 mcg of Fentanyl  Indications: Patient is a 82 y.o.male with an infected ulcer and ischemia of the right leg despite  previous interventions. The patient is brought in for angiography for further evaluation and potential treatment. Risks and benefits are discussed and informed consent is obtained  Procedure: The patient was identified and appropriate procedural time out was performed. The patient was then placed supine on the table and prepped and draped in the usual sterile fashion.Moderate conscious sedation was administered during a face to face encounter with the patient throughout the procedure with my supervision of the RN administering medicines and monitoring the patient's vital signs, pulse oximetry, telemetry and mental status throughout from the start of the procedure until the patient was taken to the recovery room. Ultrasound was used to evaluate the left common femoral artery. It was patent . A digital ultrasound image was acquired. A Seldinger needle was used to access the left common femoral artery under direct ultrasound guidance and a permanent image was performed. A 0.035 J wire was advanced without resistance and a 5Fr sheath was placed. Pigtail catheter was placed into the aorta and an AP aortogram was performed. This demonstrated normal renal arteries and normal aorta and iliac segments without significant stenosis. I then crossed the aortic bifurcation and advanced to the right femoral head. Selective right lower extremity angiogram was then performed. This demonstrated SFA has reoccluded above the previously placed stent with appearance of likely thrombus on top of chronic disease. Very sluggish distal runoff with initially only a peroneal artery seen in the proximal segment but a posterior tibial artery seen in the foot. The patient was systemically heparinized and a 6 [place] Ansell sheath was then placed over the [employer] wire. I then used a Kumpe catheter and the advantage wire to navigate down throught the occluded SFA and popliteal arteries and all the way into the TP trunk and down  into the peroneal arteries.  Intraluminal flow was confirmed in the peroneal artery. I then replaced the Advantage wire and  instilled 4 mg of TPA in the SFA, popliteal artery, tibioperoneal trunk, and peroneal arteries. This was allowed to dwell for about 10 minutes. Mechanical rheolytic thrombectomy was then performed in the arteries for about 160 cc of effluent returned again in the SFA, popliteal artery, tibioperoneal trunk, and peroneal arteries. No flow persisted. I then performed angioplasty. A 3 mm diameter by 20 cm length angioplasty balloon was used to treat from the mid peroneal artery up through the tibioperoneal trunk and the popliteal artery. This was inflated to 8 atm for 1 minute. I then selected a 6 mm diameter by 15 cm length Lutonix drug-coated angioplasty balloon. The first inflation was to about 10 atm in the proximal SFA. To further inflations were used to treat the mid and distal SFA and popliteal arteries with each inflation being to 8-10 atm for 1 minute. Completion angiogram following this showed extremely sluggish flow with no obvious stenosis or thrombus within the SFA, but the popliteal artery, tibioperoneal trunk, and runoff vessels were still occluded full of thrombus. I then used a Kumpe catheter and cannulated the posterior tibial artery which had a larger artery in the foot. I was able to cross the occlusion in the proximal posterior tibial artery with a mild amount of difficulty because confirmed intraluminal flow in the distal posterior tibial artery. I then replaced the wire. About another 60 cc of effluent was returned with mechanical rheolytic thrombectomy in the popliteal artery, tibioperoneal trunk, now the posterior tibial artery. No improvement was seen. I then elected to treat the posterior tibial artery with angioplasty. A 3 mm diameter by 20 cm length angioplasty balloon was inflated twice first in the mid to distal posterior tibial artery and second in the proximal  posterior tibial artery and tibioperoneal trunk. The first inflation was to 8 atm and the more proximal inflation was to 14 atm for 1 minute. Completion angiogram still showed very sluggish flow with residual thrombus in the popliteal artery and tibial vessels. I felt at this point, the only way to try to restore patency for limb salvage would be to continue TPA to breakup thrombus overnight. I selected a 130 cm total length 50 cm working length catheter and placed this over the wire parking the distal tip in the mid posterior tibial artery with the catheter going up through the tibioperoneal trunk, popliteal artery, and up into the mid to distal superficial femoral artery. The catheter and sheath were secured in place with Prolene suture. A central line was then placed as dictated separately. The patient was taken to the recovery room in stable condition having tolerated the procedure well.  Findings:  Aortogram: Normal aorta, iliac arteries, and renal arteries without stenosis identified. Right Lower Extremity: SFA has reoccluded above the previously placed stent with appearance of likely thrombus on top of chronic disease. Very sluggish distal runoff with initially only a peroneal artery seen in the proximal segment but a posterior tibial artery seen in the foot.   Disposition: Patient was taken to the recovery room in stable condition having tolerated the procedure well.  Complications: None  DEW,JASON 03/27/2016 5:10 PM

## 2016-03-27 NOTE — H&P (Signed)
[facility] NAME: Andrew [name]    MR#:  [id number]  DATE OF BIRTH:  April 15, 1923  DATE OF ADMISSION:  03/27/2016  PRIMARY CARE PHYSICIAN: Adin Hector, MD   REQUESTING/REFERRING PHYSICIAN: Mariea Clonts  CHIEF COMPLAINT:   Chief Complaint  Patient presents with  . Shortness of Breath  . Leg Pain    HISTORY OF PRESENT ILLNESS: Andrew [name]  is a 82 y.o. male with a known history of CKD, Htn, PVD, Asthma- had vascular stent in right leg 4-5 weeks ago. FOllows in Clinic- for ulcer on right ankle- cellulitis along with that. He was given keflex- but did not take- said pain got worse after that. Having worsening on skin redness on right ankle.  PAST MEDICAL HISTORY:   Past Medical History:  Diagnosis Date  . Asthma   . Chronic kidney disease   . Hypertension   . Peripheral vascular disease ([place])     PAST SURGICAL HISTORY: Past Surgical History:  Procedure Laterality Date  . PERIPHERAL VASCULAR CATHETERIZATION Right 02/07/2016   Procedure: Lower Extremity Angiography;  Surgeon: Algernon Huxley, MD;  Location: [facility];  Service: Cardiovascular;  Laterality: Right;  . SHOULDER SURGERY  1974  . UPPER GI ENDOSCOPY      SOCIAL HISTORY:  Social History  Substance Use Topics  . Smoking status: Former Research scientist (life sciences)  . Smokeless tobacco: Former Systems developer    Quit date: 05/16/1993  . Alcohol use No    FAMILY HISTORY:  Family History  Problem Relation Age of Onset  . Heart failure Mother   . Stroke Father     DRUG ALLERGIES:  Allergies  Allergen Reactions  . Keflex [Cephalexin] Other (See Comments)    Patient states this medication made his legs hurt really bad.    REVIEW OF SYSTEMS:   CONSTITUTIONAL: No fever, fatigue or weakness.  EYES: No blurred or double vision.  EARS, NOSE, AND THROAT: No tinnitus or ear pain.  RESPIRATORY: No cough, shortness of breath, wheezing or hemoptysis.  CARDIOVASCULAR: No chest pain,  orthopnea, edema.  GASTROINTESTINAL: No nausea, vomiting, diarrhea or abdominal pain.  GENITOURINARY: No dysuria, hematuria.  ENDOCRINE: No polyuria, nocturia,  HEMATOLOGY: No anemia, easy bruising or bleeding SKIN: No rash or lesion. Have ulcer on right ankle. MUSCULOSKELETAL: No joint pain or arthritis.   NEUROLOGIC: No tingling, numbness, weakness.  PSYCHIATRY: No anxiety or depression.   MEDICATIONS AT HOME:  Prior to Admission medications   Medication Sig Start Date End Date Taking? Authorizing Provider  aspirin EC 81 MG tablet Take 1 tablet (81 mg total) by mouth daily. 02/07/16  Yes Algernon Huxley, MD  atorvastatin (LIPITOR) 20 MG tablet Take 1 tablet (20 mg total) by mouth daily. 02/07/16  Yes Algernon Huxley, MD  Cholecalciferol 1000 units capsule Take 1,000 Units by mouth every morning.   Yes Historical Provider, MD  clopidogrel (PLAVIX) 75 MG tablet Take 1 tablet (75 mg total) by mouth daily. 02/07/16  Yes Algernon Huxley, MD  Cyanocobalamin (VITAMIN B-12) 2500 MCG SUBL Place 2,500 mcg under the tongue every morning.   Yes Historical Provider, MD  fexofenadine (ALLEGRA) 180 MG tablet Take 180 mg by mouth daily.   Yes Historical Provider, MD  fluticasone (VERAMYST) 27.5 MCG/SPRAY nasal spray Place 2 sprays into the nose daily.   Yes Historical Provider, MD  Fluticasone-Salmeterol (ADVAIR) 250-50 MCG/DOSE AEPB Inhale 1 puff into the lungs 2 (two) times daily.  Yes Historical Provider, MD  hydrochlorothiazide (MICROZIDE) 12.5 MG capsule Take 12.5 mg by mouth every morning.    Yes Historical Provider, MD  levothyroxine (SYNTHROID, LEVOTHROID) 75 MCG tablet Take 75 mcg by mouth daily before breakfast.   Yes Historical Provider, MD  losartan (COZAAR) 25 MG tablet Take 25 mg by mouth every morning. 03/09/16  Yes Historical Provider, MD  Multiple Vitamins-Minerals (PRESERVISION/LUTEIN) CAPS Take 1 capsule by mouth 2 (two) times daily.   Yes Historical Provider, MD  omeprazole (PRILOSEC) 20 MG capsule Take  20 mg by mouth daily.   Yes Historical Provider, MD  tiotropium (SPIRIVA HANDIHALER) 18 MCG inhalation capsule Place 18 mcg into inhaler and inhale daily.   Yes Historical Provider, MD  traMADol (ULTRAM) 50 MG tablet Take 1 tablet (50 mg total) by mouth every 6 (six) hours as needed. 02/07/16  Yes Algernon Huxley, MD  vitamin E 400 UNIT capsule Take 400 Units by mouth every morning.    Yes Historical Provider, MD  cephALEXin (KEFLEX) 500 MG capsule Take 500 mg by mouth 2 (two) times daily. X 5 days 03/24/16 03/28/16  Historical Provider, MD      PHYSICAL EXAMINATION:   VITAL SIGNS: Blood pressure (!) 149/90, pulse 95, temperature 97.6 F (36.4 C), temperature source Oral, resp. rate 16, height 5\' 11"  (1.803 m), weight 83 kg (183 lb), SpO2 97 %.  GENERAL:  82 y.o.-year-old patient lying in the bed with no acute distress.  EYES: Pupils equal, round, reactive to light and accommodation. No scleral icterus. Extraocular muscles intact.  HEENT: Head atraumatic, normocephalic. Oropharynx and nasopharynx clear.  NECK:  Supple, no jugular venous distention. No thyroid enlargement, no tenderness.  LUNGS: Normal breath sounds bilaterally, no wheezing, rales,rhonchi or crepitation. No use of accessory muscles of respiration.  CARDIOVASCULAR: S1, S2 normal. No murmurs, rubs, or gallops.  ABDOMEN: Soft, nontender, nondistended. Bowel sounds present. No organomegaly or mass.  EXTREMITIES: positive right leg pedal edema, no cyanosis, or clubbing.  NEUROLOGIC: Cranial nerves II through XII are intact. Muscle strength 5/5 in all extremities. Sensation intact. Gait not checked.  PSYCHIATRIC: The patient is alert and oriented x 3.  SKIN: No obvious rash, but have 2 ulcers just above medial mellioli on right leg- 3cmX 1 cm size.  LABORATORY PANEL:   CBC  Recent Labs Lab 03/27/16 0829  WBC 8.8  HGB 12.7*  HCT 36.5*  PLT 795*  MCV 94.6  MCH 32.9  MCHC 34.7  RDW 16.2*  LYMPHSABS 1.7  MONOABS 0.7  EOSABS  0.4  BASOSABS 0.2*   ------------------------------------------------------------------------------------------------------------------  Chemistries   Recent Labs Lab 03/27/16 0829  NA 139  K 4.0  CL 109  CO2 25  GLUCOSE 112*  BUN 27*  CREATININE 1.59*  CALCIUM 9.1   ------------------------------------------------------------------------------------------------------------------ estimated creatinine clearance is 32.2 mL/min (by C-G formula based on SCr of 1.59 mg/dL (H)). ------------------------------------------------------------------------------------------------------------------ No results for input(s): TSH, T4TOTAL, T3FREE, THYROIDAB in the last 72 hours.  Invalid input(s): FREET3   Coagulation profile No results for input(s): INR, PROTIME in the last 168 hours. ------------------------------------------------------------------------------------------------------------------- No results for input(s): DDIMER in the last 72 hours. -------------------------------------------------------------------------------------------------------------------  Cardiac Enzymes No results for input(s): CKMB, TROPONINI, MYOGLOBIN in the last 168 hours.  Invalid input(s): CK ------------------------------------------------------------------------------------------------------------------ Invalid input(s): POCBNP  ---------------------------------------------------------------------------------------------------------------  Urinalysis    Component Value Date/Time   COLORURINE YELLOW (A) 03/27/2016 1157   APPEARANCEUR CLEAR (A) 03/27/2016 1157   LABSPEC 1.013 03/27/2016 1157   PHURINE 6.0 03/27/2016 1157  GLUCOSEU NEGATIVE 03/27/2016 1157   HGBUR NEGATIVE 03/27/2016 1157   BILIRUBINUR NEGATIVE 03/27/2016 1157   KETONESUR NEGATIVE 03/27/2016 1157   PROTEINUR 100 (A) 03/27/2016 1157   NITRITE NEGATIVE 03/27/2016 1157   LEUKOCYTESUR NEGATIVE 03/27/2016 1157      RADIOLOGY: Dg Chest 2 View  Result Date: 03/27/2016 CLINICAL DATA:  Shortness of breath, right lower extremity wound undergoing care inter wound care facility. EXAM: CHEST  2 VIEW COMPARISON:  CT scan of the chest of September 22, 2010 FINDINGS: The lungs are mildly hyperinflated with hemidiaphragm flattening. There is no focal infiltrate. The interstitial markings are coarse. There are chronically increased lung markings in the left perihilar region. The heart is top-normal in size. The pulmonary vascularity is not engorged. The mediastinum is normal in width. There is multilevel degenerative disc disease of the thoracic spine. There is calcification in the wall of the thoracic aorta. IMPRESSION: COPD. Chronic scarring or other stable process in the left upper lobe inferiorly. Top-normal cardiac size without pulmonary edema. Aortic atherosclerosis. Electronically Signed   By: David  Martinique M.D.   On: 03/27/2016 09:34   Dg Tibia/fibula Right  Result Date: 03/27/2016 CLINICAL DATA:  82 year old male with right lower leg wound and extremity swelling. Initial encounter. EXAM: RIGHT TIBIA AND FIBULA - 2 VIEW COMPARISON:  None. FINDINGS: Long segment vascular stenting in the distal right thigh extending to the popliteal fossa. Other calcified peripheral vascular disease in the proximal calf. Widespread subcutaneous stranding and soft tissue swelling. No subcutaneous gas. The reported discrete soft tissue wound may be along the distal tibia just above the medial malleolus on the AP view. Bone mineralization is within normal limits for age. Alignment at the right knee and ankle is preserved. Degenerative changes at the joints. No acute osseous abnormality identified. IMPRESSION: 1.  No acute osseous abnormality about the right tib-fib. 2. Soft tissue swelling and evidence of widespread subcutaneous edema but no subcutaneous gas identified. Electronically Signed   By: Genevie Ann M.D.   On: 03/27/2016 10:22   US  Venous Img Lower Unilateral Right  Result Date: 03/27/2016 CLINICAL DATA:  82 year old male with a history of swelling EXAM: RIGHT LOWER EXTREMITY VENOUS DOPPLER ULTRASOUND TECHNIQUE: Gray-scale sonography with graded compression, as well as color Doppler and duplex ultrasound were performed to evaluate the lower extremity deep venous systems from the level of the common femoral vein and including the common femoral, femoral, profunda femoral, popliteal and calf veins including the posterior tibial, peroneal and gastrocnemius veins when visible. The superficial great saphenous vein was also interrogated. Spectral Doppler was utilized to evaluate flow at rest and with distal augmentation maneuvers in the common femoral, femoral and popliteal veins. COMPARISON:  None. FINDINGS: Contralateral Common Femoral Vein: Respiratory phasicity is normal and symmetric with the symptomatic side. No evidence of thrombus. Normal compressibility. Common Femoral Vein: No evidence of thrombus. Normal compressibility, respiratory phasicity and response to augmentation. Saphenofemoral Junction: No evidence of thrombus. Normal compressibility and flow on color Doppler imaging. Profunda Femoral Vein: No evidence of thrombus. Normal compressibility and flow on color Doppler imaging. Femoral Vein: No evidence of thrombus. Normal compressibility, respiratory phasicity and response to augmentation. Popliteal Vein: No evidence of thrombus. Normal compressibility, respiratory phasicity and response to augmentation. Calf Veins: No evidence of thrombus. Normal compressibility and flow on color Doppler imaging. Superficial Great Saphenous Vein: No evidence of thrombus. Normal compressibility and flow on color Doppler imaging. Other Findings:  None. IMPRESSION: Sonographic survey of the right lower extremity  negative for DVT. Signed, Dulcy Fanny. Earleen Newport, DO [facility] Electronically Signed    By: Corrie Mckusick D.O.   On: 03/27/2016 11:15   Dg Foot Complete Right  Result Date: 03/27/2016 CLINICAL DATA:  82 year old male with right lower leg wound and extremity swelling. Initial encounter. EXAM: RIGHT FOOT COMPLETE - 3+ VIEW COMPARISON:  Right foot series 03/10/2011. Right tib-fib series from today reported separately. FINDINGS: Bone mineralization remains normal for age. Right foot osseous structures appear stable and intact. Suggestion of soft tissue wound proximal to the medial malleolus again noted (image 2). No subcutaneous gas in the foot. No acute osseous abnormality identified. IMPRESSION: No acute osseous abnormality in the right foot. Electronically Signed   By: Genevie Ann M.D.   On: 03/27/2016 10:23    EKG: Orders placed or performed during the hospital encounter of 03/27/16  . ED EKG  . ED EKG  . ED EKG  . ED EKG    IMPRESSION AND PLAN:  * cellulitis with ulcer on right leg with PVD   IV vanc+ Zosyn   Vascular consult.    * PVD   Recent stent   ASA, Plavix   Vascular to do angiogram today  * Htn   Cont home meds  * Hypothyroidism   Cont synthroid.    All the records are reviewed and case discussed with ED provider. Management plans discussed with the patient, family and they are in agreement.  CODE STATUS: Code Status History    Date Active Date Inactive Code Status Order ID Comments User Context   02/07/2016 12:31 PM 02/07/2016  6:41 PM Full Code [id number]  Algernon Huxley, MD Inpatient       TOTAL TIME TAKING CARE OF THIS PATIENT: 50 minutes.    Vaughan Basta M.D on 03/27/2016   Between 7am to 6pm - Pager - [phone]  After 6pm go to [email or web address] - password [email or web address] [facility]  Office  [phone]  CC: Primary care physician; Adin Hector, MD   Note: This dictation was prepared with Dragon dictation along with smaller phrase technology. Any transcriptional errors that result from this process are  unintentional.

## 2016-03-27 NOTE — Op Note (Signed)
[facility]   PROCEDURE NOTE  PROCEDURE: 1. Right IJ triple lumen central venous catheter placement 2. Right IJ cannulation under ultrasound guidance 3. Fluoroscopic guidance for placement of a catheter  PRE-OPERATIVE DIAGNOSIS: ischemic right leg  POST-OPERATIVE DIAGNOSIS: same as above  SURGEON: DEW,JASON, MD  ANESTHESIA:  None  ESTIMATED BLOOD LOSS: minimal  FINDING(S): none  SPECIMEN(S):  none  INDICATIONS:   Andrew [name] is a 82 y.o. male who presents with need for venous access.  The patient presents for central venous catheter placement.  The patient is aware the risks of central venous catheter placement include but are not limited to: bleeding, infection, central venous injury, pneumothorax, possible venous stenosis, possible malpositioning in the venous system, and possible infections related to long-term catheter presence. The patient was aware of these risks and agreed to proceed.  DESCRIPTION: After written informed consent was obtained from the patient and/or family, the patient was placed supine in the hospital bed.  The patient was prepped with chloraprep and draped in the standard fashion for a chest or neck central venous catheter placement.  I anesthesized the neck cannulation site with 1% lidocaine then under ultrasound guidance, the right internal jugular vein was cannulated with the 18 gauge needle and a permanent image was recorded.  A J wire was then placed down in the superior vena cava.  After a skin nick and dilatation, the triple lumen central venous catheter was placed over the wire and the wire was removed.  Each port was aspirated and flushed with sterile normal saline.  As we were in the angiographic suite and the patient was fully anticoagulated, we used fluorocoscopic guidance to park the catheter in the SVC just above the right atrium. The catheter was secured in placed with three interrupted stitches of 3-0 Silk tied to the  catheter.  The catheter was dressed with sterile dressing.  Stat CXR is pending  COMPLICATIONS: none apparent  CONDITION: stable  DEW,JASON 03/27/2016, 5:07 PM

## 2016-03-27 NOTE — ED Provider Notes (Signed)
[facility] Emergency Department Provider Note  ____________________________________________  Time seen: Approximately 9:33 AM  I have reviewed the triage vital signs and the nursing notes.   HISTORY  Chief Complaint Shortness of Breath and Leg Pain    HPI Andrew [name] is a 82 y.o. male w/ a hx of PAD s/p R SFA and popliteal stent placement 8/17 presenting for right lower extremity pain, swelling and erythema. The patient has had a complicated postoperative course, with nonhealing ulcers on the medial distal tibia above the malleolus, and multiple nonhealing wounds on the second and third toes, including a traumatic laceration 03/20/16. He has been followed by the wound clinic, and 4 days ago was given a prescription for Keflex for presumed cellulitis. The patient states she has not been taking this medication because the antibiotic increases his pain. He denies any fever, chills, nausea or vomiting. He states that he feels the wound nurses wrap his leg too tightly and he usually cuts it.  The patient initially called EMS for complaints of shortness of breath, but is denying that at this time.   Past Medical History:  Diagnosis Date  . Asthma   . Chronic kidney disease   . Hypertension   . Peripheral vascular disease [facility])     Patient Active Problem List   Diagnosis Date Noted  . Chronic venous hypertension with ulcer ([place]) 01/13/2016  . Chronic venous hypertension with ulcer ([place]) 01/13/2016    Past Surgical History:  Procedure Laterality Date  . PERIPHERAL VASCULAR CATHETERIZATION Right 02/07/2016   Procedure: Lower Extremity Angiography;  Surgeon: Algernon Huxley, MD;  Location: [facility];  Service: Cardiovascular;  Laterality: Right;  . SHOULDER SURGERY  1974  . UPPER GI ENDOSCOPY      Current Outpatient Rx  . Order #: [id number] Class: Print  . Order #: [id number] Class: Print  . Order #: [id number] Class: Print  . Order #: [id number] Class:  Historical Med  . Order #: [id number] Class: Historical Med  . Order #: [id number] Class: Historical Med  . Order #: [id number] Class: Historical Med  . Order #: [id number] Class: Historical Med  . Order #: [id number] Class: Historical Med  . Order #: [id number] Class: Historical Med  . Order #: [id number] Class: Historical Med  . Order #: [id number] Class: Historical Med  . Order #: [id number] Class: Print  . Order #: [id number] Class: Historical Med    Allergies Review of patient's allergies indicates no known allergies.  Family History  Problem Relation Age of Onset  . Heart failure Mother   . Stroke Father     Social History Social History  Substance Use Topics  . Smoking status: Former Research scientist (life sciences)  . Smokeless tobacco: Former Systems developer    Quit date: 05/16/1993  . Alcohol use No    Review of Systems Constitutional: No fever/chills. ENT: No sore throat. No congestion or rhinorrhea. Cardiovascular: Denies chest pain. Denies palpitations. Respiratory: Possible shortness of breath although the patient is denying this at this time..  No cough. Gastrointestinal: No abdominal pain.  No nausea, no vomiting.  No diarrhea.  No constipation. Genitourinary: Negative for dysuria. Musculoskeletal: Negative for back pain. Positive right lower extremity swelling, erythema. Skin: Positive for rash. Neurological: Negative for headaches. No focal numbness, tingling or weakness.   10-point ROS otherwise negative.  ____________________________________________   PHYSICAL EXAM:  VITAL SIGNS: ED Triage Vitals  Enc Vitals Group     BP 03/27/16 0828 138/79     Pulse Rate 03/27/16 0828 95     Resp 03/27/16 0828  18     Temp 03/27/16 0828 97.6 F (36.4 C)     Temp Source 03/27/16 0828 Oral     SpO2 03/27/16 0828 99 %     Weight 03/27/16 0830 183 lb (83 kg)     Height 03/27/16 0830 5\' 11"  (1.803 m)     Head Circumference --      Peak Flow --      Pain Score 03/27/16 0831 8     Pain Loc --      Pain Edu? --       Excl. in [place]? --     Constitutional: Patient is chronically ill appearing but nontoxic. He answers most questions appropriately, but does have some non sequiturs. Eyes: Conjunctivae are normal.  EOMI. No scleral icterus. Head: Atraumatic. Nose: No congestion/rhinnorhea. Mouth/Throat: Mucous membranes are moist.  Neck: No stridor.  Supple.   Cardiovascular: Normal rate, regular rhythm. No murmurs, rubs or gallops.  Respiratory: Normal respiratory effort.  No accessory muscle use or retractions. Lungs CTAB.  No wheezes, rales or ronchi. Gastrointestinal: Soft, nontender and nondistended.  No guarding or rebound.  No peritoneal signs. Musculoskeletal: Positive right lower extremity edema. Negative tenderness to palpation in the calves or palpable cords. On the distal tibia just above the medial malleolus, the patient is to nonhealing ulcers that are approximately 2 x 1" each. There is granulation tissue in the center and some linear erythema around the edges, although there is erythema throughout the distal tibia with warmth. The second and third toes have bluish discoloration, and apparent trauma with abrasion. Cap refill is 2-3 seconds. Neurologic:  Alert.  Speech is clear.  Face and smile are symmetric.  EOMI.  Moves all extremities well. Skin:  Skin is warm, dry and intact. No rash noted. Psychiatric: Mood and affect are normal. Speech and behavior are normal.  Normal judgement.  ____________________________________________   LABS (all labs ordered are listed, but only abnormal results are displayed)  Labs Reviewed  CBC WITH DIFFERENTIAL/PLATELET - Abnormal; Notable for the following:       Result Value   RBC 3.86 (*)    Hemoglobin 12.7 (*)    HCT 36.5 (*)    RDW 16.2 (*)    Platelets 795 (*)    Basophils Absolute 0.2 (*)    All other components within normal limits  BASIC METABOLIC PANEL - Abnormal; Notable for the following:    Glucose, Bld 112 (*)    BUN 27 (*)     Creatinine, Ser 1.59 (*)    GFR calc non Af Amer 36 (*)    GFR calc Af Amer 42 (*)    All other components within normal limits  CULTURE, BLOOD (ROUTINE X 2)  CULTURE, BLOOD (ROUTINE X 2)  URINE CULTURE  SEDIMENTATION RATE  C-REACTIVE PROTEIN  URINALYSIS COMPLETEWITH MICROSCOPIC ([facility] ONLY)   ____________________________________________  EKG  ED ECG REPORT I, Eula Listen, the attending physician, personally viewed and interpreted this ECG.   Date: 03/27/2016  EKG Time: 831  Rate: 94  Rhythm: normal sinus rhythm; PVC  Axis: leftward  Intervals:none  ST&T Change: Nonspecific T-wave inversion in V1. No ST elevation.  ____________________________________________  RADIOLOGY  Dg Chest 2 View  Result Date: 03/27/2016 CLINICAL DATA:  Shortness of breath, right lower extremity wound undergoing care inter wound care facility. EXAM: CHEST  2 VIEW COMPARISON:  CT scan of the chest of September 22, 2010 FINDINGS: The lungs are mildly hyperinflated with hemidiaphragm flattening. There is no  focal infiltrate. The interstitial markings are coarse. There are chronically increased lung markings in the left perihilar region. The heart is top-normal in size. The pulmonary vascularity is not engorged. The mediastinum is normal in width. There is multilevel degenerative disc disease of the thoracic spine. There is calcification in the wall of the thoracic aorta. IMPRESSION: COPD. Chronic scarring or other stable process in the left upper lobe inferiorly. Top-normal cardiac size without pulmonary edema. Aortic atherosclerosis. Electronically Signed   By: David  Martinique M.D.   On: 03/27/2016 09:34   Dg Tibia/fibula Right  Result Date: 03/27/2016 CLINICAL DATA:  82 year old male with right lower leg wound and extremity swelling. Initial encounter. EXAM: RIGHT TIBIA AND FIBULA - 2 VIEW COMPARISON:  None. FINDINGS: Long segment vascular stenting in the distal right thigh extending to the popliteal  fossa. Other calcified peripheral vascular disease in the proximal calf. Widespread subcutaneous stranding and soft tissue swelling. No subcutaneous gas. The reported discrete soft tissue wound may be along the distal tibia just above the medial malleolus on the AP view. Bone mineralization is within normal limits for age. Alignment at the right knee and ankle is preserved. Degenerative changes at the joints. No acute osseous abnormality identified. IMPRESSION: 1.  No acute osseous abnormality about the right tib-fib. 2. Soft tissue swelling and evidence of widespread subcutaneous edema but no subcutaneous gas identified. Electronically Signed   By: Genevie Ann M.D.   On: 03/27/2016 10:22   US Venous Img Lower Unilateral Right  Result Date: 03/27/2016 CLINICAL DATA:  82 year old male with a history of swelling EXAM: RIGHT LOWER EXTREMITY VENOUS DOPPLER ULTRASOUND TECHNIQUE: Gray-scale sonography with graded compression, as well as color Doppler and duplex ultrasound were performed to evaluate the lower extremity deep venous systems from the level of the common femoral vein and including the common femoral, femoral, profunda femoral, popliteal and calf veins including the posterior tibial, peroneal and gastrocnemius veins when visible. The superficial great saphenous vein was also interrogated. Spectral Doppler was utilized to evaluate flow at rest and with distal augmentation maneuvers in the common femoral, femoral and popliteal veins. COMPARISON:  None. FINDINGS: Contralateral Common Femoral Vein: Respiratory phasicity is normal and symmetric with the symptomatic side. No evidence of thrombus. Normal compressibility. Common Femoral Vein: No evidence of thrombus. Normal compressibility, respiratory phasicity and response to augmentation. Saphenofemoral Junction: No evidence of thrombus. Normal compressibility and flow on color Doppler imaging. Profunda Femoral Vein: No evidence of thrombus. Normal compressibility  and flow on color Doppler imaging. Femoral Vein: No evidence of thrombus. Normal compressibility, respiratory phasicity and response to augmentation. Popliteal Vein: No evidence of thrombus. Normal compressibility, respiratory phasicity and response to augmentation. Calf Veins: No evidence of thrombus. Normal compressibility and flow on color Doppler imaging. Superficial Great Saphenous Vein: No evidence of thrombus. Normal compressibility and flow on color Doppler imaging. Other Findings:  None. IMPRESSION: Sonographic survey of the right lower extremity negative for DVT. Signed, Dulcy Fanny. Earleen Newport, DO [facility] Radiology Electronically Signed   By: Corrie Mckusick D.O.   On: 03/27/2016 11:15   Dg Foot Complete Right  Result Date: 03/27/2016 CLINICAL DATA:  82 year old male with right lower leg wound and extremity swelling. Initial encounter. EXAM: RIGHT FOOT COMPLETE - 3+ VIEW COMPARISON:  Right foot series 03/10/2011. Right tib-fib series from today reported separately. FINDINGS: Bone mineralization remains normal for age. Right foot osseous structures appear stable and intact. Suggestion of soft tissue wound proximal to the medial  malleolus again noted (image 2). No subcutaneous gas in the foot. No acute osseous abnormality identified. IMPRESSION: No acute osseous abnormality in the right foot. Electronically Signed   By: Genevie Ann M.D.   On: 03/27/2016 10:23    ____________________________________________   PROCEDURES  Procedure(s) performed: None  Procedures  Critical Care performed: No ____________________________________________   INITIAL IMPRESSION / ASSESSMENT AND PLAN / ED COURSE  Pertinent labs & imaging results that were available during my care of the patient were reviewed by me and considered in my medical decision making (see chart for details).  82 y.o. male S/P right vascular stent for her peripheral artery disease with resulting  nonhealing wounds presenting with right lower extremity swelling, tenderness, and warmth, and not taking his prescribed antibiotics. Overall, the patient's clinical picture is most consistent with a localized cellulitis. However we will check him for osteomyelitis especially in the toes. In addition, we'll get an ultrasound to rule out DVT. After the patient's initial laboratory studies, imaging studies, and symptomatically treatment are complete, will discuss the findings with Dr. Lucky Cowboy, the patient's vascular surgeon.  ----------------------------------------- 12:01 PM on 03/27/2016 -----------------------------------------  The patient has an ultrasound study which is negative for DVT in the right lower extremity. In addition he has a normal white blood cell count 8.8. He is afebrile here. His sedimentation rate is 2 and his CRP is pending. Imaging of his lower extremity does not show any evidence of osteomyelitis or subcutaneous gas. The patient has received a dose of clindamycin for right lower extremity cellulitis. I have paged Dr. Lucky Cowboy, to discuss the patient's final disposition.  At this time, the patient has no red flag warnings, and has not had an adequate by mouth trial of antibiotics at home given that he stopped taking his Keflex 4 days ago.  He has a chronic renal insufficiency which is stable for him.  ----------------------------------------- 12:11 PM on 03/27/2016 -----------------------------------------  I've spoken with Dr. Delana Meyer, the partner of Dr. Lucky Cowboy, who recommends inpatient hospitalization for IV antibiotics and evaluation by the vascular surgeon in-house. ____________________________________________  FINAL CLINICAL IMPRESSION(S) / ED DIAGNOSES  Final diagnoses:  Cellulitis of right lower extremity  Acute pain of right lower extremity  Noncompliance with medications    Clinical Course      NEW MEDICATIONS STARTED DURING THIS VISIT:  New Prescriptions   No  medications on file      Eula Listen, MD 03/27/16 1212

## 2016-03-27 NOTE — Progress Notes (Signed)
Pharmacy Antibiotic Note  Andrew [name] is a 82 y.o. male admitted on 03/27/2016 with cellulitis.  Pharmacy has been consulted for vancomycin and piperacillin/tazobactam dosing.  Plan: Piperacillin/tazobactam 3.375 g IV q8h EI  Vancomycin 1000 mg IV one time dose ordered Will follow with vancomycin 1000 mg IV q24h Goal vancomycin trough 15-20 mcg/mL   Kinetics: Using actual body weight of 81 kg Ke: 0.033 Half-life: ~21 hrs Vd: 57 L Cmin (estimated) ~16 mcg/mL  Height: 5\' 11"  (180.3 cm) Weight: 179 lb 3.7 oz (81.3 kg) IBW/kg (Calculated) : 75.3  Temp (24hrs), Avg:97.7 F (36.5 C), Min:97.6 F (36.4 C), Max:97.7 F (36.5 C)   Recent Labs Lab 03/27/16 0829  WBC 8.8  CREATININE 1.59*    Estimated Creatinine Clearance: 32.2 mL/min (by C-G formula based on SCr of 1.59 mg/dL (H)).    Allergies  Allergen Reactions  . Keflex [Cephalexin] Other (See Comments)    Patient states this medication made his legs hurt really bad.   Antimicrobials this admission: vancomycin 9/25 >>  Piperacillin/tazobactam 9/25 >>  Clindamycin pre-op 9/25  Dose adjustments this admission:  Microbiology results: 9/25 BCx: Sent 9/25 UCx: Sent  9/25 MRSA PCR: Sent  Thank you for allowing pharmacy to be a part of this patient's care.  Lenis Noon, PharmD 03/27/2016 7:15 PM

## 2016-03-27 NOTE — H&P (Signed)
[facility] History & Physical Update  The patient was interviewed and re-examined.  See consult note to follow.  There is no change in the plan of care. We plan to proceed with the scheduled procedure.  Ronalee Scheunemann, MD  03/27/2016, 3:05 PM

## 2016-03-28 ENCOUNTER — Encounter
Admission: EM | Disposition: A | Discharge status: Skilled Nursing Facility | Payer: Self-pay | Source: Home / Self Care | Attending: Internal Medicine

## 2016-03-28 ENCOUNTER — Encounter: Payer: Self-pay | Admitting: Vascular Surgery

## 2016-03-28 HISTORY — PX: PERIPHERAL VASCULAR CATHETERIZATION: SHX172C

## 2016-03-28 LAB — BASIC METABOLIC PANEL
Anion gap: 5 (ref 5–15)
BUN: 22 mg/dL — AB (ref 6–20)
CHLORIDE: 110 mmol/L (ref 101–111)
CO2: 22 mmol/L (ref 22–32)
CREATININE: 1.15 mg/dL (ref 0.61–1.24)
Calcium: 8.4 mg/dL — ABNORMAL LOW (ref 8.9–10.3)
GFR calc Af Amer: >60 mL/min (ref >60)
GFR calc non Af Amer: 54 mL/min — ABNORMAL LOW (ref >60)
GLUCOSE: 135 mg/dL — AB (ref 65–99)
Potassium: 3.9 mmol/L (ref 3.5–5.1)
SODIUM: 137 mmol/L (ref 135–145)

## 2016-03-28 LAB — URINE CULTURE: CULTURE: NO GROWTH

## 2016-03-28 LAB — CBC
HCT: 34 % — ABNORMAL LOW (ref 40.0–52.0)
Hemoglobin: 11.8 g/dL — ABNORMAL LOW (ref 13.0–18.0)
MCH: 33.1 pg (ref 26.0–34.0)
MCHC: 34.7 g/dL (ref 32.0–36.0)
MCV: 95.5 fL (ref 80.0–100.0)
PLATELETS: 524 10*3/uL — AB (ref 150–440)
RBC: 3.56 MIL/uL — ABNORMAL LOW (ref 4.40–5.90)
RDW: 16 % — AB (ref 11.5–14.5)
WBC: 10.7 10*3/uL — AB (ref 3.8–10.6)

## 2016-03-28 LAB — FIBRINOGEN
FIBRINOGEN: 173 mg/dL — AB (ref 210–475)
Fibrinogen: 226 mg/dL (ref 210–475)

## 2016-03-28 LAB — HEPARIN LEVEL (UNFRACTIONATED)
HEPARIN UNFRACTIONATED: 0.1 [IU]/mL — AB (ref 0.30–0.70)
Heparin Unfractionated: <0.1 IU/mL — ABNORMAL LOW (ref 0.30–0.70)

## 2016-03-28 SURGERY — LOWER EXTREMITY ANGIOGRAPHY
Anesthesia: Moderate Sedation | Laterality: Right

## 2016-03-28 MED ORDER — CHLORHEXIDINE GLUCONATE CLOTH 2 % EX PADS: 6.0000 | MEDICATED_PAD | Freq: Once | CUTANEOUS | Status: DC

## 2016-03-28 MED ORDER — IOPAMIDOL (ISOVUE-300) INJECTION 61%
INTRAVENOUS | Status: DC | PRN
  Administered 2016-03-28: 55 mL via INTRAVENOUS

## 2016-03-28 MED ORDER — HEPARIN SODIUM (PORCINE) 1000 UNIT/ML IJ SOLN
INTRAMUSCULAR | Status: DC | PRN
  Administered 2016-03-28: 3000 [IU] via INTRAVENOUS

## 2016-03-28 MED ORDER — MIDAZOLAM HCL 2 MG/2ML IJ SOLN
INTRAMUSCULAR | Status: AC
  Filled 2016-03-28: qty 2

## 2016-03-28 MED ORDER — HEPARIN (PORCINE) IN NACL 2-0.9 UNIT/ML-% IJ SOLN
INTRAMUSCULAR | Status: AC
  Filled 2016-03-28: qty 1000

## 2016-03-28 MED ORDER — TIROFIBAN HCL IV 12.5 MG/250 ML
INTRAVENOUS | Status: AC
  Filled 2016-03-28: qty 250

## 2016-03-28 MED ORDER — TRAMADOL HCL 50 MG PO TABS
100.0000 mg | ORAL_TABLET | Freq: Four times a day (QID) | ORAL | Status: DC | PRN
  Administered 2016-03-30: 100 mg via ORAL
  Filled 2016-03-28: qty 2

## 2016-03-28 MED ORDER — TIROFIBAN HCL IV 12.5 MG/250 ML
0.0750 ug/kg/min | INTRAVENOUS | Status: DC
  Administered 2016-03-28: 0.075 ug/kg/min via INTRAVENOUS

## 2016-03-28 MED ORDER — FENTANYL CITRATE (PF) 100 MCG/2ML IJ SOLN
INTRAMUSCULAR | Status: AC
  Filled 2016-03-28: qty 2

## 2016-03-28 MED ORDER — LIDOCAINE HCL (PF) 1 % IJ SOLN
INTRAMUSCULAR | Status: AC
  Filled 2016-03-28: qty 30

## 2016-03-28 MED ORDER — HEPARIN SODIUM (PORCINE) 1000 UNIT/ML IJ SOLN
INTRAMUSCULAR | Status: AC
  Filled 2016-03-28: qty 1

## 2016-03-28 MED ORDER — FENTANYL CITRATE (PF) 100 MCG/2ML IJ SOLN
INTRAMUSCULAR | Status: DC | PRN
  Administered 2016-03-28 (×3): 50 ug via INTRAVENOUS

## 2016-03-28 MED ORDER — SODIUM CHLORIDE 0.9 % IV SOLN: INTRAVENOUS | Status: DC

## 2016-03-28 SURGICAL SUPPLY — 16 items
BALLN LUTONIX 4X120X130 (BALLOONS) ×3
BALLN ULTRVRSE 3X220X150 (BALLOONS) ×3
BALLN ULTRVRSE 4X40X150 (BALLOONS) ×2
BALLN ULTRVRSE 4X40X150 OTW (BALLOONS) ×1
BALLOON LUTONIX 4X120X130 (BALLOONS) ×1 IMPLANT
BALLOON ULTRVRSE 3X220X150 (BALLOONS) ×1 IMPLANT
BALLOON ULTRVRSE 4X40X150 OTW (BALLOONS) ×1 IMPLANT
DEVICE PRESTO INFLATION (MISCELLANEOUS) ×3 IMPLANT
DEVICE SOLENT OMNI 120CM (CATHETERS) ×3 IMPLANT
DEVICE STARCLOSE SE CLOSURE (Vascular Products) ×3 IMPLANT
GUIDEWIRE PFTE-COATED .018X300 (WIRE) ×3 IMPLANT
PACK ANGIOGRAPHY (CUSTOM PROCEDURE TRAY) ×3 IMPLANT
STENT TIGRIS 5X100X120 (Permanent Stent) ×3 IMPLANT
STENT TIGRIS 5X60X120 (Permanent Stent) ×3 IMPLANT
TOWEL OR 17X26 4PK STRL BLUE (TOWEL DISPOSABLE) ×3 IMPLANT
WIRE J 3MM .035X145CM (WIRE) ×3 IMPLANT

## 2016-03-28 NOTE — Progress Notes (Signed)
Patient off unit to vascular for surgery at 0725.  Patient transferred by bed with York Cerise, orderly and Tess, RN to vascular. Report received at this time from [place], [place].

## 2016-03-28 NOTE — Progress Notes (Signed)
ANTICOAGULATION CONSULT NOTE - Initial Consult  Pharmacy Consult for heparin drip monitoring Indication: VTE treatment  Allergies  Allergen Reactions  . Keflex [Cephalexin] Other (See Comments)    Patient states this medication made his legs hurt really bad.    Patient Measurements: Height: 5\' 11"  (180.3 cm) Weight: 179 lb 3.7 oz (81.3 kg) IBW/kg (Calculated) : 75.3 Heparin Dosing Weight: 81 kg  Vital Signs: Temp: 97.8 F (36.6 C) (09/25 1900) Temp Source: Oral (09/25 1900) BP: 102/66 (09/26 0000) Pulse Rate: 92 (09/26 0000)  Labs:  Recent Labs  03/27/16 0829 03/27/16 1907 03/27/16 2340  HGB 12.7* 12.1* 11.8*  HCT 36.5* 36.1* 34.4*  PLT 795* 824* 611*  HEPARINUNFRC  --  <0.10* <0.10*  CREATININE 1.59* 1.18  --     Estimated Creatinine Clearance: 43.4 mL/min (by C-G formula based on SCr of 1.18 mg/dL).   Medical History: Past Medical History:  Diagnosis Date  . Asthma   . Chronic kidney disease   . Hypertension   . Peripheral vascular disease ([facility])     Medications:  Scheduled:  . albuterol  5 mg Nebulization Once  . aspirin EC  81 mg Oral Daily  . atorvastatin  20 mg Oral Daily  . cholecalciferol  1,000 Units Oral BH-q7a  . fluticasone  2 spray Each Nare Daily  . heparin      . hydrochlorothiazide  12.5 mg Oral BH-q7a  . levothyroxine  75 mcg Oral QAC breakfast  . loratadine  10 mg Oral Daily  . losartan  25 mg Oral BH-q7a  . mometasone-formoterol  2 puff Inhalation BID  . morphine      . multivitamin-lutein  1 capsule Oral BID  . pantoprazole  40 mg Oral Daily  . piperacillin-tazobactam (ZOSYN)  IV  3.375 g Intravenous Q8H  . sodium chloride flush  3 mL Intravenous Q12H  . tiotropium  18 mcg Inhalation Daily  . vancomycin  1,000 mg Intravenous Q24H  . vitamin B-12  2,500 mcg Oral BH-q7a  . vitamin E  400 Units Oral BH-q7a   Infusions:  . alteplase (LIMB ISCHEMIA) 10 mg in normal saline (0.02 mg/mL) infusion 0.5 mg/hr (03/27/16 1821)  .  dexmedetomidine 0.4 mcg/kg/hr (03/27/16 2130)  . heparin 500 Units/hr (03/27/16 1710)    Assessment: Patient admitted with ischemic right leg and underwent angiogram today. Patient currently has orders for heparin and alteplase per vascular.   Orders entered for heparin level to be checked q6h  Goal of Therapy:  Heparin level 0.2 - 0.5 units/ml Monitor platelets by anticoagulation protocol: Yes   Plan: Heparin level subtherapeutic. Increase rate to 600 units/hr, will recheck heparin level in 6 hours.  Laural Benes, Pharm.D., BCPS Clinical Pharmacist 03/28/2016,1:27 AM

## 2016-03-28 NOTE — Op Note (Signed)
[facility] Percutaneous Study/Intervention Procedural Note   Date of Surgery: 03/28/2016  Surgeon(s):Naksh Radi   Assistants:none  Pre-operative Diagnosis: PAD with ulceration and infection right lower extremity  Post-operative diagnosis: Same  Procedure(s) Performed: 1. angiogram through existing catheter RLE 2. Catheter placement into right posterior tibial artery from left femoral approach 3. Mechanical rheolytic thrombectomy with the AngioJet on the catheter to the right popliteal, tibioperoneal trunk, and posterior tibial arteries 4. Percutaneous transluminal angioplasty of the posterior tibial artery and tibioperoneal trunk with 3 mm diameter by 22 cm length angioplasty balloon 5. Percutaneous transluminal angioplasty of the right popliteal artery with 4 mm diameter by 10 cm length Lutonix drug-coated angioplasty balloon   6.  [place] stent placement to the right popliteal artery with 5 mm diameter by 10 cm length stent  7.  Tigris stent placement to the right posterior tibial artery with 5 mm diameter by 67 m length stent 8. StarClose closure device left femoral artery  EBL: minimal  Contrast: 55 cc  Fluoro Time: 4.2 minutes  Moderate Conscious Sedation Time: approximately 45  minutes using  150 mcg of Fentanyl  Indications: Patient is a 82 y.o.male with limb threatening ischemia of the right lower extremity with infection and ulceration. He was brought down yesterday for angiography and had intervention but residual thrombosis remained and we performed catheter directed thrombolytic therapy overnight. The patient is brought in for angiography for further evaluation and potential treatment. Risks and benefits are discussed and informed consent is obtained  Procedure: The patient was identified and appropriate procedural time out was performed. The patient was  then placed supine on the table and prepped and draped in the usual sterile fashion.Moderate conscious sedation was administered during a face to face encounter with the patient throughout the procedure with my supervision of the RN administering medicines and monitoring the patient's vital signs, pulse oximetry, telemetry and mental status throughout from the start of the procedure until the patient was taken to the recovery room. The existing lytic catheter was removed and an 0.018 advantage wire was placed. Imaging was performed through the sheath. This demonstrated continued thrombus in the popliteal and high grade stenosis/thrombus in the proximal posterior tibial artery that was the best run off distally. I then elected to use the AngioJet on the catheter and perform mechanical rheolytic thrombectomy for approximately 107 cc of effluent returned in the right posterior tibial artery, tibioperoneal trunk, and popliteal arteries. Flow-limiting residual thrombus and stenosis remained worse in the posterior tibial arteries and the popliteal artery, but both were greater than 50%. Angioplasty was then performed. In the posterior tibial artery and tibioperoneal trunk I used a 3 mm diameter by 22 cm length angioplasty balloon inflated to 8 atm for 1 minute. In the popliteal artery a 4 mm diameter by 10 cm length Lutonix drug-coated angioplasty balloon was selected and inflated to 10 atm for 1 minute. Completion angiogram showed dissection in both locations with a severe spiral dissection and near occlusive stenosis residual in the posterior tibial artery and residual stenosis of greater than 50% and thrombus in the popliteal artery. I elected to place stents in both location as I did not feel they would remain patent otherwise. A 5 mm diameter by 10 cm length [place]ris stent was placed in the popliteal artery taking care not to harm the origin of the peroneal artery distally. A 5 mm diameter by 6 cm length Tigris stent  was placed in the posterior tibial artery to encompass  the spiral dissection taking care not to encroach on the origin of the peroneal artery proximally as the peroneal artery did provide some additional runoff distally. These were both postdilated with a 4 mm balloon with excellent angiographic completion result and less than 20% residual stenosis. I elected to terminate the procedure. The sheath was removed and StarClose closure device was deployed in the left femoral artery with excellent hemostatic result. The patient was taken to the recovery room in stable condition having tolerated the procedure well.  Findings:   Right Lower Extremity: continued thrombus and stenosis in the popliteal and high grade stenosis/thrombus in the proximal posterior tibial artery that was the best run off distally.   Disposition: Patient was taken to the recovery room in stable condition having tolerated the procedure well.  Complications: None  Tulio Facundo 03/28/2016 8:34 AM

## 2016-03-28 NOTE — Progress Notes (Signed)
Pharmacy Antibiotic Note  Andrew [name] is a 82 y.o. male admitted on 03/27/2016 with cellulitis.  Pharmacy has been consulted for vancomycin and piperacillin/tazobactam dosing.  Plan: Piperacillin/tazobactam 3.375 g IV q8h EI  SCr improved but will continue with vancomycin 1000 mg iv q 24 hours for now and target goal trough 10-15 mcg/ml unless concerned for abscess or osteo.     Height: 5\' 11"  (180.3 cm) Weight: 179 lb 3.7 oz (81.3 kg) IBW/kg (Calculated) : 75.3  Temp (24hrs), Avg:98 F (36.7 C), Min:97.7 F (36.5 C), Max:98.8 F (37.1 C)   Recent Labs Lab 03/27/16 0829 03/27/16 1907 03/27/16 2340 03/28/16 0534  WBC 8.8 14.3* 12.9* 10.7*  CREATININE 1.59* 1.18  --  1.15    Estimated Creatinine Clearance: 44.6 mL/min (by C-G formula based on SCr of 1.15 mg/dL).    Allergies  Allergen Reactions  . Keflex [Cephalexin] Other (See Comments)    Patient states this medication made his legs hurt really bad.   Antimicrobials this admission: vancomycin 9/25 >>  Piperacillin/tazobactam 9/25 >>  Clindamycin pre-op 9/25  Dose adjustments this admission:  Microbiology results: 9/25 BCx: NGTD 9/25 UCx: Sent  9/25 MRSA PCR: negative  Thank you for allowing pharmacy to be a part of this patient's care.  Ulice Dash D, PharmD 03/28/2016 11:13 AM

## 2016-03-28 NOTE — Progress Notes (Signed)
[employer] [facility] [employer] NAME: Andrew [name]    MRN#:  [id number]  DATE OF BIRTH:  06/19/1923  SUBJECTIVE:  Hospital Day: 1 day Andrew [name] is a 82 y.o. male presenting with Shortness of Breath and Leg Pain .   Overnight events: Vascular surgery last evening and this morning Interval Events: Patient sleeping, easily arousable states pain and leg well controlled at this time  REVIEW OF SYSTEMS:  CONSTITUTIONAL: No fever, fatigue or weakness.  EYES: No blurred or double vision.  EARS, NOSE, AND THROAT: No tinnitus or ear pain.  RESPIRATORY: No cough, shortness of breath, wheezing or hemoptysis.  CARDIOVASCULAR: No chest pain, orthopnea, edema.  GASTROINTESTINAL: No nausea, vomiting, diarrhea or abdominal pain.  GENITOURINARY: No dysuria, hematuria.  ENDOCRINE: No polyuria, nocturia,  HEMATOLOGY: No anemia, easy bruising or bleeding SKIN: Ulcer right leg otherwise No rash or lesion. MUSCULOSKELETAL: No joint pain or arthritis.   NEUROLOGIC: No tingling, numbness, weakness.  PSYCHIATRY: No anxiety or depression.   DRUG ALLERGIES:   Allergies  Allergen Reactions  . Keflex [Cephalexin] Other (See Comments)    Patient states this medication made his legs hurt really bad.    VITALS:  Blood pressure 92/62, pulse 94, temperature 97.7 F (36.5 C), temperature source Oral, resp. rate 18, height 5\' 11"  (1.803 m), weight 81.3 kg (179 lb 3.7 oz), SpO2 95 %.  PHYSICAL EXAMINATION:  VITAL SIGNS: Vitals:   03/28/16 1000 03/28/16 1100  BP: 105/70 92/62  Pulse: 93 94  Resp: 18 18  Temp:     GENERAL:82 y.o.male currently in no acute distress.  HEAD: Normocephalic, atraumatic.  EYES: Pupils equal, round, reactive to light. Extraocular muscles intact. No scleral icterus.  MOUTH: Moist mucosal membrane. Dentition intact. No abscess noted.  EAR, NOSE, THROAT: Clear without exudates. No external lesions.  NECK: Supple. No thyromegaly. No  nodules. No JVD.  PULMONARY: Clear to ascultation, without wheeze rails or rhonci. No use of accessory muscles, Good respiratory effort. good air entry bilaterally CHEST: Nontender to palpation.  CARDIOVASCULAR: S1 and S2. Regular rate and rhythm. No murmurs, rubs, or gallops. No edema. Pedal pulses 2+ bilaterally.  GASTROINTESTINAL: Soft, nontender, nondistended. No masses. Positive bowel sounds. No hepatosplenomegaly.  MUSCULOSKELETAL: No swelling, clubbing, or edema. Range of motion full in all extremities.  NEUROLOGIC: Cranial nerves II through XII are intact. No gross focal neurological deficits. Sensation intact. Reflexes intact.  SKIN: 2 approximately 2 x 2 centimeter ulcerations with surrounding erythema of the distal right leg otherwise No ulceration, lesions, rashes, or cyanosis. Skin warm and dry. Turgor intact.  PSYCHIATRIC: Mood, affect within normal limits. The patient is awake, alert and oriented x 3. Though still seems somewhat confused Insight, judgment intact.      LABORATORY PANEL:   CBC  Recent Labs Lab 03/28/16 0534  WBC 10.7*  HGB 11.8*  HCT 34.0*  PLT 524*   ------------------------------------------------------------------------------------------------------------------  Chemistries   Recent Labs Lab 03/27/16 1907 03/28/16 0534  NA  --  137  K  --  3.9  CL  --  110  CO2  --  22  GLUCOSE  --  135*  BUN  --  22*  CREATININE 1.18 1.15  CALCIUM  --  8.4*  MG 1.6*  --    ------------------------------------------------------------------------------------------------------------------  Cardiac Enzymes No results for input(s): TROPONINI in the last 168 hours. ------------------------------------------------------------------------------------------------------------------  RADIOLOGY:  Dg Chest 2 View  Result Date: 03/27/2016 CLINICAL DATA:  Shortness of breath,  right lower extremity wound undergoing care inter wound care facility. EXAM: CHEST  2  VIEW COMPARISON:  CT scan of the chest of September 22, 2010 FINDINGS: The lungs are mildly hyperinflated with hemidiaphragm flattening. There is no focal infiltrate. The interstitial markings are coarse. There are chronically increased lung markings in the left perihilar region. The heart is top-normal in size. The pulmonary vascularity is not engorged. The mediastinum is normal in width. There is multilevel degenerative disc disease of the thoracic spine. There is calcification in the wall of the thoracic aorta. IMPRESSION: COPD. Chronic scarring or other stable process in the left upper lobe inferiorly. Top-normal cardiac size without pulmonary edema. Aortic atherosclerosis. Electronically Signed   By: Kamesha Herne  [place] M.D.   On: 03/27/2016 09:34   Dg Tibia/fibula Right  Result Date: 03/27/2016 CLINICAL DATA:  82 year old male with right lower leg wound and extremity swelling. Initial encounter. EXAM: RIGHT TIBIA AND FIBULA - 2 VIEW COMPARISON:  None. FINDINGS: Long segment vascular stenting in the distal right thigh extending to the popliteal fossa. Other calcified peripheral vascular disease in the proximal calf. Widespread subcutaneous stranding and soft tissue swelling. No subcutaneous gas. The reported discrete soft tissue wound may be along the distal tibia just above the medial malleolus on the AP view. Bone mineralization is within normal limits for age. Alignment at the right knee and ankle is preserved. Degenerative changes at the joints. No acute osseous abnormality identified. IMPRESSION: 1.  No acute osseous abnormality about the right tib-fib. 2. Soft tissue swelling and evidence of widespread subcutaneous edema but no subcutaneous gas identified. Electronically Signed   By: Genevie Ann M.D.   On: 03/27/2016 10:22   US Venous Img Lower Unilateral Right  Result Date: 03/27/2016 CLINICAL DATA:  82 year old male with a history of swelling EXAM: RIGHT LOWER EXTREMITY VENOUS DOPPLER ULTRASOUND TECHNIQUE:  Gray-scale sonography with graded compression, as well as color Doppler and duplex ultrasound were performed to evaluate the lower extremity deep venous systems from the level of the common femoral vein and including the common femoral, femoral, profunda femoral, popliteal and calf veins including the posterior tibial, peroneal and gastrocnemius veins when visible. The superficial great saphenous vein was also interrogated. Spectral Doppler was utilized to evaluate flow at rest and with distal augmentation maneuvers in the common femoral, femoral and popliteal veins. COMPARISON:  None. FINDINGS: Contralateral Common Femoral Vein: Respiratory phasicity is normal and symmetric with the symptomatic side. No evidence of thrombus. Normal compressibility. Common Femoral Vein: No evidence of thrombus. Normal compressibility, respiratory phasicity and response to augmentation. Saphenofemoral Junction: No evidence of thrombus. Normal compressibility and flow on color Doppler imaging. Profunda Femoral Vein: No evidence of thrombus. Normal compressibility and flow on color Doppler imaging. Femoral Vein: No evidence of thrombus. Normal compressibility, respiratory phasicity and response to augmentation. Popliteal Vein: No evidence of thrombus. Normal compressibility, respiratory phasicity and response to augmentation. Calf Veins: No evidence of thrombus. Normal compressibility and flow on color Doppler imaging. Superficial Great Saphenous Vein: No evidence of thrombus. Normal compressibility and flow on color Doppler imaging. Other Findings:  None. IMPRESSION: Sonographic survey of the right lower extremity negative for DVT. Signed, Dulcy Fanny. Earleen Newport, DO [facility] Electronically Signed   By: Corrie Mckusick D.O.   On: 03/27/2016 11:15   Dg Chest Port 1 View  Result Date: 03/27/2016 CLINICAL DATA:  Admitted for lower extremity wound care. Central line placement. EXAM:  PORTABLE CHEST 1 VIEW COMPARISON:  Chest x-ray from earlier same day. FINDINGS: Right IJ central line has been placed in the interval. Tip of the line is adequately positioned at the level of the mid SVC. No other interval change. No pneumothorax. IMPRESSION: Right IJ central line placement with tip adequately positioned at the level of the mid SVC. Could consider advancing approximately 4-5 cm for more optimal radiographic positioning at the cavoatrial junction. Electronically Signed   By: Franki Cabot M.D.   On: 03/27/2016 20:13   Dg Foot Complete Right  Result Date: 03/27/2016 CLINICAL DATA:  82 year old male with right lower leg wound and extremity swelling. Initial encounter. EXAM: RIGHT FOOT COMPLETE - 3+ VIEW COMPARISON:  Right foot series 03/10/2011. Right tib-fib series from today reported separately. FINDINGS: Bone mineralization remains normal for age. Right foot osseous structures appear stable and intact. Suggestion of soft tissue wound proximal to the medial malleolus again noted (image 2). No subcutaneous gas in the foot. No acute osseous abnormality identified. IMPRESSION: No acute osseous abnormality in the right foot. Electronically Signed   By: Genevie Ann M.D.   On: 03/27/2016 10:23    EKG:   Orders placed or performed during the hospital encounter of 03/27/16  . ED EKG  . ED EKG  . ED EKG  . ED EKG    ASSESSMENT AND PLAN:   Andrew [name] is a 82 y.o. male presenting with Shortness of Breath and Leg Pain . Admitted 03/27/2016 : Day #: 1 day 1. Peripheral arterial disease with ulceration and infection: Vascular surgery input appreciated underwent thrombolytic therapy followed by angioplasty with stent placement. Continue pain control, continue current antibiotics 2. Essential hypertension blood pressure marginal hold hydrochlorothiazide 3. Hypothyroidism unspecified: Synthroid  All the records are reviewed and case discussed with Care Management/Social Workerr. Management plans  discussed with the patient, family and they are in agreement.  CODE STATUS: full TOTAL TIME TAKING CARE OF THIS PATIENT: 28 minutes.   POSSIBLE D/C IN 2-3DAYS, DEPENDING ON CLINICAL CONDITION.   Andrew [name],  Karenann Cai.D on 03/28/2016 at 12:57 PM  Between 7am to 6pm - Pager - [phone]  After 6pm: House Pager: - [place] [facility]  Office  [phone]  CC: Primary care physician; Andrew Hector, MD

## 2016-03-28 NOTE — H&P (Signed)
[facility] History & Physical Update  The patient was interviewed and re-examined.  The patient's previous History and Physical has been reviewed and is unchanged.  There is no change in the plan of care. We plan to proceed with the scheduled procedure.  Eliany Mccarter, MD  03/28/2016, 7:35 AM

## 2016-03-28 NOTE — OR Nursing (Signed)
Heparin infusion and TPA held for procedure, Zosyn infusing, no additional antibiotic needed per Dr Lucky Cowboy.

## 2016-03-29 ENCOUNTER — Encounter: Payer: Self-pay | Admitting: Vascular Surgery

## 2016-03-29 LAB — BASIC METABOLIC PANEL
ANION GAP: 6 (ref 5–15)
BUN: 31 mg/dL — ABNORMAL HIGH (ref 6–20)
CALCIUM: 8.1 mg/dL — AB (ref 8.9–10.3)
CHLORIDE: 107 mmol/L (ref 101–111)
CO2: 23 mmol/L (ref 22–32)
CREATININE: 1.62 mg/dL — AB (ref 0.61–1.24)
GFR calc non Af Amer: 35 mL/min — ABNORMAL LOW (ref >60)
GFR, EST AFRICAN AMERICAN: 41 mL/min — AB (ref >60)
Glucose, Bld: 149 mg/dL — ABNORMAL HIGH (ref 65–99)
Potassium: 3.5 mmol/L (ref 3.5–5.1)
SODIUM: 136 mmol/L (ref 135–145)

## 2016-03-29 LAB — CBC
HCT: 30.8 % — ABNORMAL LOW (ref 40.0–52.0)
HEMOGLOBIN: 10.8 g/dL — AB (ref 13.0–18.0)
MCH: 33 pg (ref 26.0–34.0)
MCHC: 35.1 g/dL (ref 32.0–36.0)
MCV: 94.1 fL (ref 80.0–100.0)
Platelets: 535 10*3/uL — ABNORMAL HIGH (ref 150–440)
RBC: 3.28 MIL/uL — ABNORMAL LOW (ref 4.40–5.90)
RDW: 16.2 % — ABNORMAL HIGH (ref 11.5–14.5)
WBC: 11.5 10*3/uL — AB (ref 3.8–10.6)

## 2016-03-29 LAB — MAGNESIUM: MAGNESIUM: 1.7 mg/dL (ref 1.7–2.4)

## 2016-03-29 MED ORDER — ACETAMINOPHEN 325 MG PO TABS: 650.0000 mg | ORAL_TABLET | Freq: Four times a day (QID) | ORAL | Status: DC | PRN

## 2016-03-29 MED ORDER — SODIUM CHLORIDE 0.9 % IV SOLN
INTRAVENOUS | Status: DC
  Administered 2016-03-29: 09:00:00 via INTRAVENOUS

## 2016-03-29 MED ORDER — APIXABAN 2.5 MG PO TABS
2.5000 mg | ORAL_TABLET | Freq: Two times a day (BID) | ORAL | Status: DC
Start: 2016-03-29 — End: 2016-03-31
  Administered 2016-03-29 – 2016-03-31 (×5): 2.5 mg via ORAL
  Filled 2016-03-29 (×5): qty 1

## 2016-03-29 MED ORDER — TRAMADOL HCL 50 MG PO TABS: 50.0000 mg | ORAL_TABLET | Freq: Four times a day (QID) | ORAL | Status: DC | PRN

## 2016-03-29 NOTE — Progress Notes (Addendum)
[facility]  Daily Progress Note  Subjective: 1 Day Post-Op: angiogram through existing catheter RLE, Catheter placement into right posterior tibial artery from left femoral approach, Mechanical rheolytic thrombectomy with the AngioJet on the catheter to the right popliteal, tibioperoneal trunk, and posterior tibial arteries,  Percutaneous transluminal angioplasty of the posterior tibial artery and tibioperoneal trunk with 3 mm diameter by 22 cm length angioplasty balloon, Percutaneous transluminal angioplasty of the right popliteal artery with 4 mm diameter by 10 cm length Lutonix drug-coated angioplasty balloon, Tigris stent placement to the right popliteal artery with 5 mm diameter by 10 cm length stent, Tigris stent placement to the right posterior tibial artery with 5 mm diameter by 69 m length stent with StarClose closure device left femoral artery.  2 Day Post-Op: Ultrasound guidance for vascular access left femoral artery, Catheter placement into right posterior tibial artery and peroneal arteries from left femoral approach Aortogram and selective right lower extremity angiogram, Catheter directed thrombolytic therapy with 4 mg of TPA instilled in the right SFA, popliteal artery, tibioperoneal trunk, and peroneal arteries, Mechanical rheolytic thrombectomy with the AngioJet Omni catheter in the right SFA, popliteal artery, tibioperoneal trunk, peroneal artery, and posterior tibial artery. Percutaneous transluminal angioplasty of the peroneal artery and tibioperoneal trunk with 3 mm diameter angioplasty balloon. Percutaneous transluminal angioplasty of the entire SFA and popliteal artery with 6 mm diameter angioplasty balloon including a Lutonix drug-coated balloon proximally. Percutaneous transluminal angioplasty of the posterior tibial artery and tibioperoneal trunk with 3 mm diameter angioplasty balloon, Placement of a thrombolytic catheter for continuous infusion of TPA using  a 130 centimeters total length 50 cm working.  Patient with some LLE foot pain this AM otherwise without complaint. Patient stating "he cant go home" and "needs to go to a rest home" at discharge as he states he doesn't have any to take care of him.  Objective: Vitals:   03/29/16 0300 03/29/16 0400 03/29/16 0500 03/29/16 0600  BP: 101/64 98/61 105/68 112/63  Pulse: 100 (!) 102 95 92  Resp: 11 16 18 11   Temp:   98.5 F (36.9 C)   TempSrc:   Oral   SpO2: 97% 99% 96% 97%  Weight:      Height:        Intake/Output Summary (Last 24 hours) at 03/29/16 0743 Last data filed at 03/29/16 0600  Gross per 24 hour  Intake              612 ml  Output              575 ml  Net               37 ml   Physical Exam: A&Ox3, NAD Neck: RIJ central line - intact, clean and dry CV: RRR Pulmonary: CTA Bilaterally Abdomen: Soft, Nontender, Nondistended Right Groin: OR dressing in place. No drainage or swelling noted. Vascular: Left Lower Extremity: Warm, PT pulses present   Laboratory: CBC    Component Value Date/Time   WBC 11.5 (H) 03/29/2016 0500   HGB 10.8 (L) 03/29/2016 0500   HCT 30.8 (L) 03/29/2016 0500   PLT 535 (H) 03/29/2016 0500   BMET    Component Value Date/Time   NA 136 03/29/2016 0500   K 3.5 03/29/2016 0500   CL 107 03/29/2016 0500   CO2 23 03/29/2016 0500   GLUCOSE 149 (H) 03/29/2016 0500   BUN 31 (H) 03/29/2016 0500   CREATININE 1.62 (H) 03/29/2016 0500  CALCIUM 8.1 (L) 03/29/2016 0500   GFRNONAA 35 (L) 03/29/2016 0500   GFRAA 41 (L) 03/29/2016 0500   Assessment/Planning: 82 year old male s/p left lower extremity angiogram for thrombosis and ulceration - Improvement 1) Will start PO Eliquis today. 2) Will order PT consult to see if patient eligible for SNF placement vs home PT. 3) Will consult case management for home health needs vs SNF 4) Transfer out of unit 5) If patient ambulating independently, he can be discharged home. 6) Increase in creatinine - maybe  from dye load and vancomycin - will increase IV fluids for today 6) Discussed with Dr. Ellis Parents Dionisio Aragones PA-C 03/29/2016 7:43 AM

## 2016-03-29 NOTE — Care Management (Signed)
Physical therapy informed CM that therapy is recommending short term skilled nursing placement.  Notified CSW

## 2016-03-29 NOTE — Progress Notes (Signed)
[name] at [place] NAME: Andrew [name]    MRN#:  [id number]  DATE OF BIRTH:  [birth year]  SUBJECTIVE:  Hospital Day: 2 days Andrew [name] is a 82 y.o. male presenting with Shortness of Breath and Leg Pain .   Overnight events: no acute events Interval Events: pain well controlled, no complaints  REVIEW OF SYSTEMS:  CONSTITUTIONAL: No fever, fatigue or weakness.  EYES: No blurred or double vision.  EARS, NOSE, AND THROAT: No tinnitus or ear pain.  RESPIRATORY: No cough, shortness of breath, wheezing or hemoptysis.  CARDIOVASCULAR: No chest pain, orthopnea, edema.  GASTROINTESTINAL: No nausea, vomiting, diarrhea or abdominal pain.  GENITOURINARY: No dysuria, hematuria.  ENDOCRINE: No polyuria, nocturia,  HEMATOLOGY: No anemia, easy bruising or bleeding SKIN: Ulcer right leg otherwise No rash or lesion. MUSCULOSKELETAL: No joint pain or arthritis.   NEUROLOGIC: No tingling, numbness, weakness.  PSYCHIATRY: No anxiety or depression.   DRUG ALLERGIES:   Allergies  Allergen Reactions  . Keflex [Cephalexin] Other (See Comments)    Patient states this medication made his legs hurt really bad.    VITALS:  Blood pressure 102/61, pulse (!) 108, temperature 98.1 F (36.7 C), temperature source Oral, resp. rate (!) 23, height 5\' 11"  (1.803 m), weight 81.3 kg (179 lb 3.7 oz), SpO2 100 %.  PHYSICAL EXAMINATION:  VITAL SIGNS: Vitals:   03/29/16 1000 03/29/16 1100  BP: (!) 103/59 102/61  Pulse: (!) 108 (!) 108  Resp: (!) 21 (!) 23  Temp:     GENERAL:82 y.o.male currently in no acute distress.  HEAD: Normocephalic, atraumatic.  EYES: Pupils equal, round, reactive to light. Extraocular muscles intact. No scleral icterus.  MOUTH: Moist mucosal membrane. Dentition intact. No abscess noted.  EAR, NOSE, THROAT: Clear without exudates. No external lesions.  NECK: Supple. No thyromegaly. No nodules. No JVD.  PULMONARY: Clear to  ascultation, without wheeze rails or rhonci. No use of accessory muscles, Good respiratory effort. good air entry bilaterally CHEST: Nontender to palpation.  CARDIOVASCULAR: S1 and S2. Regular rate and rhythm. No murmurs, rubs, or gallops. No edema. Pedal pulses 2+ bilaterally.  GASTROINTESTINAL: Soft, nontender, nondistended. No masses. Positive bowel sounds. No hepatosplenomegaly.  MUSCULOSKELETAL: No swelling, clubbing, or edema. Range of motion full in all extremities.  NEUROLOGIC: Cranial nerves II through XII are intact. No gross focal neurological deficits. Sensation intact. Reflexes intact.  SKIN: 2 approximately 2 x 2 centimeter ulcerations with decreased surrounding erythema of the distal right leg otherwise No ulceration, lesions, rashes, or cyanosis. Skin warm and dry. Turgor intact.  PSYCHIATRIC: Mood, affect within normal limits. The patient is awake, alert and oriented x 3. Though still seems somewhat confused Insight, judgment intact.      LABORATORY PANEL:   CBC  Recent Labs Lab 03/29/16 0500  WBC 11.5*  HGB 10.8*  HCT 30.8*  PLT 535*   ------------------------------------------------------------------------------------------------------------------  Chemistries   Recent Labs Lab 03/29/16 0500  NA 136  K 3.5  CL 107  CO2 23  GLUCOSE 149*  BUN 31*  CREATININE 1.62*  CALCIUM 8.1*  MG 1.7   ------------------------------------------------------------------------------------------------------------------  Cardiac Enzymes No results for input(s): TROPONINI in the last 168 hours. ------------------------------------------------------------------------------------------------------------------  RADIOLOGY:  Dg Chest Port 1 View  Result Date: 03/27/2016 CLINICAL DATA:  Admitted for lower extremity wound care. Central line placement. EXAM: PORTABLE CHEST 1 VIEW COMPARISON:  Chest x-ray from earlier same day. FINDINGS: Right IJ central line has been placed in  the  interval. Tip of the line is adequately positioned at the level of the mid SVC. No other interval change. No pneumothorax. IMPRESSION: Right IJ central line placement with tip adequately positioned at the level of the mid SVC. Could consider advancing approximately 4-5 cm for more optimal radiographic positioning at the cavoatrial junction. Electronically Signed   By: Franki Cabot M.D.   On: 03/27/2016 20:13    EKG:   Orders placed or performed during the hospital encounter of 03/27/16  . ED EKG  . ED EKG  . ED EKG  . ED EKG    ASSESSMENT AND PLAN:   Andrew [name] is a 82 y.o. male presenting with Shortness of Breath and Leg Pain . Admitted 03/27/2016 : Day #: 2 days 1. Peripheral arterial disease with ulceration and infection: Vascular surgery input appreciated underwent thrombolytic therapy followed by angioplasty with stent placement. Continue pain control, downgrade antibiotics 2. Essential hypertension blood pressure marginal hold hydrochlorothiazide 3. Hypothyroidism unspecified: Synthroid  All the records are reviewed and case discussed with Care Management/Social Workerr. Management plans discussed with the patient, family and they are in agreement.  CODE STATUS: full TOTAL TIME TAKING CARE OF THIS PATIENT: 28 minutes.   POSSIBLE D/C IN 1-2DAYS, DEPENDING ON CLINICAL CONDITION.   Hower,  Karenann [name].D on 03/29/2016 at 12:34 PM  Between 7am to 6pm - Pager - [phone]  After 6pm: House Pager: - [place] [facility]  Office  [phone]  CC: Primary care physician; Adin Hector, MD

## 2016-03-29 NOTE — Progress Notes (Signed)
Patient remains hemodynamically stable with right leg still red ,minimal swelling with intermittent pain,antigoalation started in ICU,will monitor closely

## 2016-03-29 NOTE — Progress Notes (Signed)
Pharmacy Antibiotic Note  Andrew [name] is a 82 y.o. male admitted on 03/27/2016 with cellulitis.  Pharmacy has been consulted for vancomycin and piperacillin/tazobactam dosing.  Plan: Pt on vancomycin 1 g IV q24h currently. SCr worsened today but will continue with vancomycin 1000 mg IV q 24 hours for now and target goal trough 15-20 mcg/ml. Trough due before 4th dose on 9/29. Will need to continue to monitor renal function closely and adjust dose if renal function continue to worsen. BMET already ordered for AM.     Height: 5\' 11"  (180.3 cm) Weight: 179 lb 3.7 oz (81.3 kg) IBW/kg (Calculated) : 75.3  Temp (24hrs), Avg:98.4 F (36.9 C), Min:98.1 F (36.7 C), Max:98.9 F (37.2 C)   Recent Labs Lab 03/27/16 0829 03/27/16 1907 03/27/16 2340 03/28/16 0534 03/29/16 0500  WBC 8.8 14.3* 12.9* 10.7* 11.5*  CREATININE 1.59* 1.18  --  1.15 1.62*    Estimated Creatinine Clearance: 31.6 mL/min (by C-G formula based on SCr of 1.62 mg/dL (H)).    Allergies  Allergen Reactions  . Keflex [Cephalexin] Other (See Comments)    Patient states this medication made his legs hurt really bad.   Antimicrobials this admission: vancomycin 9/25 >>  Piperacillin/tazobactam 9/25 >> 9/27 Clindamycin pre-op 9/25  Dose adjustments this admission:  Microbiology results: 9/25 BCx: NGTD 9/25 UCx: Sent  9/25 MRSA PCR: negative  Thank you for allowing pharmacy to be a part of this patient's care.  Rocky Morel, PharmD 03/29/2016 2:07 PM

## 2016-03-29 NOTE — Clinical Social Work Note (Signed)
Clinical Social Work Assessment  Patient Details  Name: Andrew [name] MRN: [id number] Date of Birth: June 16, 1923  Date of referral:  03/29/16               Reason for consult:  Facility Placement                Permission sought to share information with:  Facility Sport and exercise psychologist, Family Supports Permission granted to share information::  Yes, Verbal Permission Granted  Name::     Andrew [name], Andrew [name] [phone] or Andrew [name], Andrew [name] [phone]   Agency::  SNF admissions  Relationship::     Contact Information:     Housing/Transportation Living arrangements for the past 2 months:  Single Family Home Source of Information:  Patient Patient Interpreter Needed:  None Criminal Activity/Legal Involvement Pertinent to Current Situation/Hospitalization:  No - Comment as needed Significant Relationships:  Adult Children, Other(Comment) (Nephew) Lives with:  Self Do you feel safe going back to the place where you live?  No Need for family participation in patient care:  No (Coment)  Care giving concerns:  Patient feels like he needs some short term rehab before he is able to return back home.   Social Worker assessment / plan:  Patient is a 82 year old male who lives alone in his own house.  Patient is alert and oriented x4 and able to express his feelings.  Patient was talkative and pleasant to talk to.  Patient stated his wife passed away 6 years ago from dementia, and he is currently living across the street from his nephew who helps take care of him when he needs help.  Patient expressed that he has never been to SNF for short term rehab.  MSW explained to patient what the process is for placement and role of MSW.  Patient was explained what to expect at SNF and how the discharge planning will take place once he is at [facility].  Patient expressed he is willing to go for short term rehab, and he gave MSW permission to fax out to SNFs in [facility].  Patient was informed how  insurance will pay for his stay.  Patient expressed he did not have any other questions or concerns.    Employment status:  Retired Nurse, adult PT Recommendations:  [place] / Referral to community resources:  [place]  Patient/Family's Response to care:  Patient in agreement to going to SNF for short term rehab.  Patient/Family's Understanding of and Emotional Response to Diagnosis, Current Treatment, and Prognosis:  Patient is aware of current diagnosis and treatment plan.  Patient expressed he feels like he has been taking good care of himself and is hopeful that he will not have to be in SNF for a long period of time.   Emotional Assessment Appearance:    Attitude/Demeanor/Rapport:    Affect (typically observed):  Appropriate, Calm, Pleasant Orientation:  Oriented to Self, Oriented to Place, Oriented to  Time, Oriented to Situation Alcohol / Substance use:  Not Applicable Psych involvement (Current and /or in the community):  No (Comment)  Discharge Needs  Concerns to be addressed:  Lack of Support, Adjustment to Illness Readmission within the last 30 days:  No Current discharge risk:  Lives alone, Lack of support system Barriers to Discharge:  Ship broker, Continued Medical Work up   [employer] R 03/29/2016, 4:10 PM

## 2016-03-29 NOTE — Care Management (Signed)
Spoke with patient directly.  Prior to decline of circulation in patient's lower extremity, he was able to drive.  he lives alone and has a nephew that lives across the street.  His wife died 6 years ago.  Denies issues accessing medical care or obtaining medications.  Discussed physical therapy's recommendation  of short term skilled nursing placement.  Discussed this would have to be approved by his insurance company.

## 2016-03-29 NOTE — Care Management (Signed)
Patient presents from home.  Underwent angiogram for thrombosis and ulceration of left lower extremity 9/25.  There is a physical therapy consult pending to determine if patient will qualify for short term skilled nursing facility placement.  If not, will set up home health nursing, physical therapy and aide.  Patient has order to transfer out of icu to 2A.  CM will meet with patient directly to discuss discharge plan.

## 2016-03-29 NOTE — NC FL2 (Signed)
[place] LEVEL OF CARE SCREENING TOOL     IDENTIFICATION  Patient Name: Andrew [name] Birthdate: 02/18/1923 Sex: male Admission Date (Current Location): 03/27/2016  [place] and [place] Number:  Engineering geologist and Address:  [facility], [address], [place], [place] [address]      Provider Number: [id number]  Attending Physician Name and Address:  Lytle Butte, MD  Relative Name and Phone Number:  Tashi, Brkic [phone] or Joselito, Choudry [phone]      Current Level of Care: Hospital Recommended Level of Care: [place] Prior Approval Number:    Date Approved/Denied:   PASRR Number: [id number]  Discharge Plan: SNF    Current Diagnoses: Patient Active Problem List   Diagnosis Date Noted  . Cellulitis 03/27/2016  . Cold foot with peripheral vascular disease ([place]) 03/27/2016  . Chronic venous hypertension with ulcer ([place]) 01/13/2016  . Chronic venous hypertension with ulcer ([place]) 01/13/2016    Orientation RESPIRATION BLADDER Height & Weight     Self, Time, Situation, Place  Normal Incontinent Weight: 179 lb 3.7 oz (81.3 kg) Height:  5\' 11"  (180.3 cm)  BEHAVIORAL SYMPTOMS/MOOD NEUROLOGICAL BOWEL NUTRITION STATUS      Continent Diet (Regular Diet)  AMBULATORY STATUS COMMUNICATION OF NEEDS Skin   Limited Assist Verbally Surgical wounds                       Personal Care Assistance Level of Assistance  Bathing, Feeding, Dressing Bathing Assistance: Limited assistance Feeding assistance: Limited assistance Dressing Assistance: Limited assistance     Functional Limitations Info  Sight, Hearing, Speech Sight Info: Adequate Hearing Info: Adequate Speech Info: Adequate    SPECIAL CARE FACTORS FREQUENCY  PT (By licensed PT)     PT Frequency: 5x a week              Contractures Contractures Info: Not present    Additional Factors Info  Code Status, Allergies  Code Status Info: Full Allergies Info: KEFLEX CEPHALEXIN            Current Medications (03/29/2016):  This is the current hospital active medication list Current Facility-Administered Medications  Medication Dose Route Frequency Provider Last Rate Last Dose  . 0.9 %  sodium chloride infusion  250 mL Intravenous PRN Algernon Huxley, MD      . 0.9 %  sodium chloride infusion   Intravenous Continuous Janalyn Harder Stegmayer, PA-C 75 mL/hr at 03/29/16 0901    . acetaminophen (TYLENOL) tablet 650 mg  650 mg Oral Q6H PRN Lytle Butte, MD      . albuterol (PROVENTIL) (2.5 MG/3ML) 0.083% nebulizer solution 5 mg  5 mg Nebulization Once Eula Listen, MD      . apixaban Arne [place]) tablet 2.5 mg  2.5 mg Oral BID Janalyn Harder Stegmayer, PA-C   2.5 mg at 03/29/16 1133  . aspirin EC tablet 81 mg  81 mg Oral Daily Vaughan Basta, MD   81 mg at 03/29/16 1132  . atorvastatin (LIPITOR) tablet 20 mg  20 mg Oral Daily Vaughan Basta, MD   20 mg at 03/29/16 1132  . cholecalciferol (VITAMIN D) tablet 1,000 Units  1,000 Units Oral [id number] Vaughan Basta, MD   1,000 Units at 03/29/16 0743  . dexmedetomidine (PRECEDEX) 400 MCG/100ML (4 mcg/mL) infusion  0.4-1.2 mcg/kg/hr Intravenous Titrated Algernon Huxley, MD   Stopped at 03/28/16 1316  . fluticasone (FLONASE) 50 MCG/ACT nasal spray 2 spray  2 spray Each Nare Daily Vaughan Basta, MD   2 spray at 03/29/16 1132  . hydrALAZINE (APRESOLINE) injection 5 mg  5 mg Intravenous Q20 Min PRN Algernon Huxley, MD      . labetalol (NORMODYNE,TRANDATE) injection 10 mg  10 mg Intravenous Q10 min PRN Algernon Huxley, MD      . levothyroxine (SYNTHROID, LEVOTHROID) tablet 75 mcg  75 mcg Oral QAC breakfast Vaughan Basta, MD   75 mcg at 03/29/16 0743  . loratadine (CLARITIN) tablet 10 mg  10 mg Oral Daily Vaughan Basta, MD   10 mg at 03/29/16 1132  . losartan (COZAAR) tablet 25 mg  25 mg Oral Martin Majestic, MD   25 mg at 03/29/16 0743  .  mometasone-formoterol (DULERA) 200-5 MCG/ACT inhaler 2 puff  2 puff Inhalation BID Vaughan Basta, MD   2 puff at 03/29/16 0743  . multivitamin-lutein (OCUVITE-LUTEIN) capsule 1 capsule  1 capsule Oral BID Vaughan Basta, MD   1 capsule at 03/29/16 1132  . ondansetron (ZOFRAN) injection 4 mg  4 mg Intravenous Q6H PRN Algernon Huxley, MD      . pantoprazole (PROTONIX) EC tablet 40 mg  40 mg Oral Daily Vaughan Basta, MD   40 mg at 03/29/16 1133  . sodium chloride flush (NS) 0.9 % injection 3 mL  3 mL Intravenous Q12H Algernon Huxley, MD   3 mL at 03/29/16 1000  . sodium chloride flush (NS) 0.9 % injection 3 mL  3 mL Intravenous PRN Algernon Huxley, MD      . tiotropium [facility] inhalation capsule 18 mcg  18 mcg Inhalation Daily Vaughan Basta, MD   18 mcg at 03/29/16 0742  . traMADol (ULTRAM) tablet 100 mg  100 mg Oral Q6H PRN Janalyn Harder Stegmayer, PA-C      . traMADol (ULTRAM) tablet 50 mg  50 mg Oral Q6H PRN Lytle Butte, MD      . vancomycin (VANCOCIN) IVPB 1000 mg/200 mL premix  1,000 mg Intravenous Q24H Lenis Noon, RPH   1,000 mg at 03/29/16 0153  . vitamin B-12 (CYANOCOBALAMIN) tablet 2,500 mcg  2,500 mcg Oral Martin Majestic, MD   2,500 mcg at 03/29/16 0742  . vitamin E capsule 400 Units  400 Units Oral Martin Majestic, MD   400 Units at 03/29/16 [id number]     Discharge Medications: Please see discharge summary for a list of discharge medications.  Relevant Imaging Results:  Relevant Lab Results:   Additional Information SSN [id number]  [name]

## 2016-03-29 NOTE — Clinical Social Work Placement (Signed)
   CLINICAL SOCIAL WORK PLACEMENT  NOTE  Date:  03/29/2016  Patient Details  Name: Andrew [name] MRN: [id number] Date of Birth: 28-Jan-1923  Clinical Social Work is seeking post-discharge placement for this patient at the [place] level of care (*CSW will initial, date and re-position this form in  chart as items are completed):  Yes   Patient/family provided with [place] Work Department's list of facilities offering this level of care within the geographic area requested by the patient (or if unable, by the patient's family).  Yes   Patient/family informed of their freedom to choose among providers that offer the needed level of care, that participate in Medicare, Medicaid or managed care program needed by the patient, have an available bed and are willing to accept the patient.  Yes   Patient/family informed of [facility]'s ownership interest in [facility] and [facility], as well as of the fact that they are under no obligation to receive care at these facilities.  PASRR submitted to EDS on 03/29/16     PASRR number received on 03/29/16     Existing PASRR number confirmed on       FL2 transmitted to all facilities in geographic area requested by pt/family on 03/29/16     FL2 transmitted to all facilities within larger geographic area on       Patient informed that his/her managed care company has contracts with or will negotiate with certain facilities, including the following:            Patient/family informed of bed offers received.  Patient chooses bed at       Physician recommends and patient chooses bed at      Patient to be transferred to   on  .  Patient to be transferred to facility by       Patient family notified on   of transfer.  Name of family member notified:        PHYSICIAN Please sign FL2     Additional Comment:    _______________________________________________ [name] 03/29/2016, 4:27  PM

## 2016-03-29 NOTE — Evaluation (Signed)
Physical Therapy Evaluation Patient Details Name: Andrew [name] MRN: [id number] DOB: 02/11/1923 Today's Date: 03/29/2016   History of Present Illness  presented to ER secondary to worsening pain, redness of R LE; admitted with R LE cellulitis and ulceration.  Status post revascularization (angiogram, angioplasty, thrombectomy and stenting) to R LE 9/25 and 9/26.  Clinical Impression  Upon evaluation, patient alert and oriented; follows all commands and demonstrates good safety awareness/insight.  Very motivated and eager for OOB activity as tolerated.  Denies significant pain in R LE despite movement/WBing this date.  Able to complete bed mobility with close sup; sit/stand, basic transfers and gait (10') with RW, min assist.  Very slow and guarded, but no overt buckling or LOB.  Very slow and shuffling gait performance; unable to tolerate distances required to facilitate safe discharge home. Would benefit from skilled PT to address above deficits and promote optimal return to PLOF; recommend transition to STR upon discharge from acute hospitalization.     Follow Up Recommendations SNF    Equipment Recommendations  Rolling walker with 5" wheels    Recommendations for Other Services       Precautions / Restrictions Precautions Precautions: Fall Restrictions Weight Bearing Restrictions: No      Mobility  Bed Mobility Overal bed mobility: Needs Assistance Bed Mobility: Supine to Sit     Supine to sit: Supervision     General bed mobility comments: heavy use of momentum to negotiate LEs on/off bed surface  Transfers Overall transfer level: Needs assistance Equipment used: Rolling walker (2 wheeled) Transfers: Sit to/from Stand Sit to Stand: Min assist         General transfer comment: multiple attempts and use of bilat UEs to assist with movement transition; decreased functional strenght/power in bilat LEs  Ambulation/Gait Ambulation/Gait assistance: Min  assist Ambulation Distance (Feet): 10 Feet Assistive device: Rolling walker (2 wheeled)       General Gait Details: forward flexed posture with very short, shuffling steps; decreased stance time/weight acceptance to R LE, though denies significant increase in pain with WBing. Mod SOB with minimal activity, but sats >92% on RA  Stairs            Wheelchair Mobility    Modified Rankin (Stroke Patients Only)       Balance Overall balance assessment: Needs assistance Sitting-balance support: No upper extremity supported;Feet supported Sitting balance-Leahy Scale: Good     Standing balance support: Bilateral upper extremity supported Standing balance-Leahy Scale: Fair                               Pertinent Vitals/Pain Pain Assessment: No/denies pain    Home Living Family/patient expects to be discharged to:: Skilled nursing facility Living Arrangements: Alone               Additional Comments: Patient lives alone in [place] home with 2 steps, single rail to enter    Prior Function Level of Independence: Independent with assistive device(s)         Comments: Mod indep with use of SPC for ADLs, household mobiltiy; has family that provides check in but cannot provide 24 hour.  Denies fall history     Hand Dominance        Extremity/Trunk Assessment   Upper Extremity Assessment: Overall WFL for tasks assessed           Lower Extremity Assessment: Generalized weakness (grossly 4-/5 throughout; R ankle reddened  with dressing over ulceration)         Communication   Communication: No difficulties  Cognition Arousal/Alertness: Awake/alert Behavior During Therapy: WFL for tasks assessed/performed Overall Cognitive Status: Within Functional Limits for tasks assessed                      General Comments      Exercises Other Exercises Other Exercises: Additional therex/mobility deferred, as patient transferring to  medical floor immediately upon conclusion of evaluation   Assessment/Plan    PT Assessment Patient needs continued PT services  PT Problem List Decreased strength;Decreased range of motion;Decreased activity tolerance;Decreased balance;Decreased mobility;Decreased knowledge of use of DME;Decreased safety awareness;Decreased knowledge of precautions;Decreased skin integrity;Pain;Cardiopulmonary status limiting activity          PT Treatment Interventions DME instruction;Gait training;Stair training;Functional mobility training;Therapeutic activities;Therapeutic exercise;Balance training;Patient/family education    PT Goals (Current goals can be found in the Care Plan section)  Acute Rehab PT Goals Patient Stated Goal: to go to rehab and get my strength back PT Goal Formulation: With patient Time For Goal Achievement: 04/12/16 Potential to Achieve Goals: Good    Frequency Min 2X/week   Barriers to discharge Decreased caregiver support      Co-evaluation               End of Session Equipment Utilized During Treatment: Gait belt Activity Tolerance: Patient tolerated treatment well Patient left: in bed;with call bell/phone within reach Nurse Communication: Mobility status         Time: [id number] PT Time Calculation (min) (ACUTE ONLY): 25 min   Charges:   PT Evaluation $PT Eval Moderate Complexity: 1 Procedure     PT G Codes:        Gaynell Eggleton H. Owens Shark, PT, DPT, NCS 03/29/16, 5:18 PM [phone]

## 2016-03-29 NOTE — Progress Notes (Signed)
Report given to Mia Creek, RN on 2a. Patient transported to rm 242. CCMD and Elink notified that patient has left ICU. First telemetry verification completed with CCMD for 2a. Per Vascular PA Marcelle Overlie) d/c patient's foley. Per Dr. Lavetta Nielsen okay to d/c central line. Foley discontinued by this nurse, order placed for central line to be removed on 2a, Lance  notified of new orders.  Wilnette Kales

## 2016-03-30 ENCOUNTER — Ambulatory Visit: Payer: Medicare Other | Admitting: Surgery

## 2016-03-30 LAB — CBC
HCT: 30.3 % — ABNORMAL LOW (ref 40.0–52.0)
HEMOGLOBIN: 10.7 g/dL — AB (ref 13.0–18.0)
MCH: 33.3 pg (ref 26.0–34.0)
MCHC: 35.4 g/dL (ref 32.0–36.0)
MCV: 94.3 fL (ref 80.0–100.0)
Platelets: 502 10*3/uL — ABNORMAL HIGH (ref 150–440)
RBC: 3.21 MIL/uL — ABNORMAL LOW (ref 4.40–5.90)
RDW: 16.3 % — ABNORMAL HIGH (ref 11.5–14.5)
WBC: 10.6 10*3/uL (ref 3.8–10.6)

## 2016-03-30 LAB — BASIC METABOLIC PANEL
Anion gap: 4 — ABNORMAL LOW (ref 5–15)
BUN: 31 mg/dL — ABNORMAL HIGH (ref 6–20)
CALCIUM: 8.2 mg/dL — AB (ref 8.9–10.3)
CO2: 23 mmol/L (ref 22–32)
CREATININE: 1.44 mg/dL — AB (ref 0.61–1.24)
Chloride: 110 mmol/L (ref 101–111)
GFR calc Af Amer: 47 mL/min — ABNORMAL LOW (ref >60)
GFR, EST NON AFRICAN AMERICAN: 41 mL/min — AB (ref >60)
GLUCOSE: 117 mg/dL — AB (ref 65–99)
Potassium: 3.7 mmol/L (ref 3.5–5.1)
SODIUM: 137 mmol/L (ref 135–145)

## 2016-03-30 LAB — MAGNESIUM: Magnesium: 1.6 mg/dL — ABNORMAL LOW (ref 1.7–2.4)

## 2016-03-30 MED ORDER — MAGNESIUM SULFATE 2 GM/50ML IV SOLN
2.0000 g | Freq: Once | INTRAVENOUS | Status: AC
  Administered 2016-03-30: 2 g via INTRAVENOUS
  Filled 2016-03-30: qty 50

## 2016-03-30 MED ORDER — DOCUSATE SODIUM 100 MG PO CAPS
100.0000 mg | ORAL_CAPSULE | Freq: Two times a day (BID) | ORAL | Status: DC
  Administered 2016-03-30 – 2016-03-31 (×3): 100 mg via ORAL
  Filled 2016-03-30 (×3): qty 1

## 2016-03-30 NOTE — Progress Notes (Signed)
CSW presented bed offers to patient. Per patient he'd like to discuss bed offers with his nephew. Stated his nephew and his wife will visit him this evening and they will discuss it then. CSW left SNF list in patient's room and highlighted facilities that are able to accept patient. CSW also provided cell phone number. CSW faxed in information for [facility] auth. Will need bed choice to complete auth. CSW will continue to follow and assist.  Ernest Pine, MSW, LCSW, [place] Social Worker [phone]

## 2016-03-30 NOTE — Progress Notes (Addendum)
Pharmacy Note   Spoke to Ms. Stegmayer to clarify apixaban dose. Pt's SCr trended down to <1.5 but is still borderline at 1.44. Per Vascular PA, plan is to continue apixaban at the 2.5 mg PO BID dose per Dr. Lucky Cowboy due to age and various other factors.

## 2016-03-30 NOTE — Progress Notes (Signed)
[facility] NAME: Andrew [name]    MR#:  [id number]  DATE OF BIRTH:  12/13/1922  SUBJECTIVE:   She is here due to shortness of breath and right leg pain.  Noted to have peripheral artery disease with ulceration on right lower extremity status post angiogram and angioplasty. Clinically improved and right leg looks better. Denies any worsening pain in the lower extremity. Pulses + with Doppler   REVIEW OF SYSTEMS:    Review of Systems  Constitutional: Negative for fever and weight loss.  HENT: Negative for congestion, nosebleeds and tinnitus.   Eyes: Negative for blurred vision, double vision and redness.  Respiratory: Negative for cough, hemoptysis and shortness of breath.   Cardiovascular: Negative for chest pain, orthopnea, leg swelling and PND.  Gastrointestinal: Negative for abdominal pain, diarrhea, melena, nausea and vomiting.  Genitourinary: Negative for dysuria, hematuria and urgency.  Musculoskeletal: Negative for falls and joint pain.  Neurological: Negative for dizziness, tingling, sensory change, focal weakness, seizures, weakness and headaches.  Endo/Heme/Allergies: Negative for polydipsia. Does not bruise/bleed easily.  Psychiatric/Behavioral: Negative for depression and memory loss. The patient is not nervous/anxious.     Nutrition: Regular Tolerating Diet: Yes Tolerating PT: Eval noted.    DRUG ALLERGIES:   Allergies  Allergen Reactions  . Keflex [Cephalexin] Other (See Comments)    Patient states this medication made his legs hurt really bad.    VITALS:  Blood pressure 114/66, pulse 100, temperature 98.6 F (37 C), temperature source Oral, resp. rate 16, height 5\' 11"  (1.803 m), weight 81.3 kg (179 lb 3.7 oz), SpO2 97 %.  PHYSICAL EXAMINATION:   Physical Exam  GENERAL:  82 y.o.-year-old patient sitting up in chair in no acute distress.  EYES: Pupils equal, round, reactive to light and accommodation.  No scleral icterus. Extraocular muscles intact.  HEENT: Head atraumatic, normocephalic. Oropharynx and nasopharynx clear.  NECK:  Supple, no jugular venous distention. No thyroid enlargement, no tenderness.  LUNGS: Normal breath sounds bilaterally, no wheezing, rales, rhonchi. No use of accessory muscles of respiration.  CARDIOVASCULAR: S1, S2 normal. No murmurs, rubs, or gallops.  ABDOMEN: Soft, nontender, nondistended. Bowel sounds present. No organomegaly or mass.  EXTREMITIES: No cyanosis, clubbing or edema b/l.    NEUROLOGIC: Cranial nerves II through XII are intact. No focal Motor or sensory deficits b/l.   PSYCHIATRIC: The patient is alert and oriented x 3.  SKIN: No obvious rash, lesion, or ulcer.    LABORATORY PANEL:   CBC  Recent Labs Lab 03/30/16 0500  WBC 10.6  HGB 10.7*  HCT 30.3*  PLT 502*   ------------------------------------------------------------------------------------------------------------------  Chemistries   Recent Labs Lab 03/30/16 0500  NA 137  K 3.7  CL 110  CO2 23  GLUCOSE 117*  BUN 31*  CREATININE 1.44*  CALCIUM 8.2*  MG 1.6*   ------------------------------------------------------------------------------------------------------------------  Cardiac Enzymes No results for input(s): TROPONINI in the last 168 hours. ------------------------------------------------------------------------------------------------------------------  RADIOLOGY:  No results found.   ASSESSMENT AND PLAN:   82 year old male last medical history of hypertension, chronic kidney disease, peripheral vascular disease, asthma who presented to the hospital due to right lower extremity redness swelling and pain.  1. Lower extremity redness and pain-secondary to cellulitis with ulcer. -Seen by vascular surgery status post angiogram and angioplasty and clinically improved. -Continue empiric antibiotics with IV vancomycin will switch to something oral upon  discharge. -Continue Eliquis as per Vascular surgery  2. Hx of PVD - cont. Eliquis,  ASA, Statin  3. HTN - cont. Losartan. BP stable.   4. Hypothyroidism - cont. Synthroid.   5. GERD - cont. Protonix.   6. COPD - no acute exacerbation.  - cont. [place] Numbers     All the records are reviewed and case discussed with Care Management/Social Worker. Management plans discussed with the patient, family and they are in agreement.  CODE STATUS: Full code  DVT Prophylaxis: Eliquis  TOTAL TIME TAKING CARE OF THIS PATIENT: 30 minutes.   POSSIBLE D/C IN 1-2 DAYS, DEPENDING ON CLINICAL CONDITION.   Henreitta Leber M.D on 03/30/2016 at 3:40 PM  Between 7am to 6pm - Pager - [phone]  After 6pm go to [email or web address] - Proofreader  [facility]  Office  [phone]  CC: Primary care physician; Adin Hector, MD

## 2016-03-30 NOTE — Progress Notes (Signed)
Patient remains hemodynamically stable,bed search in progress for SNF placement,righr leg looking better with intermittent pain.

## 2016-03-30 NOTE — Progress Notes (Signed)
Central venous line removed intact as ordered,pressure applied for 10 monutes,no bleeding noted/obsderved,patient lying down flat at this time,vital signs within normal limits

## 2016-03-30 NOTE — Progress Notes (Signed)
[facility]  Daily Progress Note   Subjective: 2 Day Post-Op: angiogram through existing catheter RLE, Catheter placement into right posterior tibial artery from left femoral approach, Mechanical rheolytic thrombectomy with the AngioJet on the catheter to the right popliteal, tibioperoneal trunk, and posterior tibial arteries,  Percutaneous transluminal angioplasty of the posterior tibial artery and tibioperoneal trunk with 3 mm diameter by 22cm length angioplasty balloon, Percutaneous transluminal angioplasty of the right popliteal artery with 4 mm diameter by 10 cm length Lutonix drug-coated angioplasty balloon, Tigrisstent placement to the right popliteal artery with 5 mm diameter by 10 cm length stent, Tigris stent placement to the right posterior tibial artery with 5 mm diameter by 45 m length stent with StarClose closure device leftfemoral artery.  3 Day Post-Op: Ultrasound guidance for vascular access leftfemoral artery, Catheter placement into right posterior tibial artery and peroneal arteries from left femoral approach Aortogram and selective rightlower extremity angiogram, Catheter directed thrombolytic therapy with 4 mg of TPA instilled in the right SFA, popliteal artery, tibioperoneal trunk, and peroneal arteries, Mechanical rheolytic thrombectomy with the AngioJet Omni catheter in the right SFA, popliteal artery, tibioperoneal trunk, peroneal artery, and posterior tibial artery. Percutaneous transluminal angioplasty of the peroneal artery and tibioperoneal trunk with 3 mm diameter angioplasty balloon. Percutaneous transluminal angioplasty of the entire SFA and popliteal artery with 6 mm diameter angioplasty balloon including a Lutonix drug-coated balloon proximally. Percutaneous transluminal angioplasty of the posterior tibial artery and tibioperoneal trunk with 3 mm diameter angioplasty balloon, Placement of a thrombolytic catheter for continuous infusion of TPA  using a 130 centimeters total length 50 cm working.  Patient still with some LLE foot pain however with some improvement today. Seen by PT - recommend SNF placement. This is what patient wants as well. Patient can be discharged as soon as placement is secured from vascular standpoint.   Objective: Vitals:   03/30/16 0506 03/30/16 0755 03/30/16 1112 03/30/16 1507  BP: 123/65 (!) 119/59 115/62 114/66  Pulse: (!) 104 97 (!) 103 100  Resp: 18 16 18 16   Temp: 98.2 F (36.8 C) 97.9 F (36.6 C) 98.1 F (36.7 C) 98.6 F (37 C)  TempSrc: Oral Oral Oral Oral  SpO2: 95% 98% 95% 97%  Weight:      Height:        Intake/Output Summary (Last 24 hours) at 03/30/16 1540 Last data filed at 03/30/16 1537  Gross per 24 hour  Intake             1050 ml  Output             1075 ml  Net              -25 ml   Physical Exam: A&Ox3, NAD Neck: RIJ central line - removed. Dressing in place. No drainage or swelling.  CV: RRR Pulmonary: CTA Bilaterally Abdomen: Soft, Nontender, Nondistended Right Groin: OR dressing in place. No drainage or swelling noted. Vascular: Left Lower Extremity: Warm, PT pulses present   Laboratory: CBC    Component Value Date/Time   WBC 10.6 03/30/2016 0500   HGB 10.7 (L) 03/30/2016 0500   HCT 30.3 (L) 03/30/2016 0500   PLT 502 (H) 03/30/2016 0500   BMET    Component Value Date/Time   NA 137 03/30/2016 0500   K 3.7 03/30/2016 0500   CL 110 03/30/2016 0500   CO2 23 03/30/2016 0500   GLUCOSE 117 (H) 03/30/2016 0500   BUN 31 (H) 03/30/2016 0500  CREATININE 1.44 (H) 03/30/2016 0500   CALCIUM 8.2 (L) 03/30/2016 0500   GFRNONAA 41 (L) 03/30/2016 0500   GFRAA 47 (L) 03/30/2016 0500   Assessment/Planning: 82 year old male s/p left lower extremity angiogram for thrombosis and ulceration - Improvement 1) Repleted magnesium this AM. 2) Seen by PT - recommend dispo to SNF 3) Creatinine trending downward. Will continue fluids for now.  4) OK to d/c to SNF when  placement is secured from vascular standpoint.  5) Discussed with Dr. Ellis Parents [facility] PA-C 03/30/2016 3:40 PM

## 2016-03-30 NOTE — Progress Notes (Signed)
Andrew [name] was admitted to the Hospital on 03/27/2016 and Discharged  03/30/2016 and should be excused from work/school   for 5 days starting 03/27/2016 , may return to work/school without any restrictions.  Call Abel Presto MD, [facility]  [phone] with questions.  Henreitta Leber M.D on 03/30/2016,at 4:10 PM

## 2016-03-31 LAB — BASIC METABOLIC PANEL
Anion gap: 5 (ref 5–15)
BUN: 27 mg/dL — AB (ref 6–20)
CALCIUM: 8.3 mg/dL — AB (ref 8.9–10.3)
CO2: 22 mmol/L (ref 22–32)
CREATININE: 1.14 mg/dL (ref 0.61–1.24)
Chloride: 109 mmol/L (ref 101–111)
GFR calc Af Amer: >60 mL/min (ref >60)
GFR calc non Af Amer: 54 mL/min — ABNORMAL LOW (ref >60)
GLUCOSE: 111 mg/dL — AB (ref 65–99)
Potassium: 3.8 mmol/L (ref 3.5–5.1)
Sodium: 136 mmol/L (ref 135–145)

## 2016-03-31 LAB — MAGNESIUM: Magnesium: 2 mg/dL (ref 1.7–2.4)

## 2016-03-31 MED ORDER — APIXABAN 2.5 MG PO TABS: 2.5000 mg | ORAL_TABLET | Freq: Two times a day (BID) | ORAL | Status: DC

## 2016-03-31 MED ORDER — SULFAMETHOXAZOLE-TRIMETHOPRIM 800-160 MG PO TABS: 1.0000 | ORAL_TABLET | Freq: Two times a day (BID) | ORAL | 0 refills | Status: AC

## 2016-03-31 NOTE — Clinical Social Work Placement (Signed)
   CLINICAL SOCIAL WORK PLACEMENT  NOTE  Date:  03/31/2016  Patient Details  Name: Andrew [name] MRN: [id number] Date of Birth: [birth year]  Clinical Social Work is seeking post-discharge placement for this patient at the [place] level of care (*CSW will initial, date and re-position this form in  chart as items are completed):  Yes   Patient/family provided with [place] Work Department's list of facilities offering this level of care within the geographic area requested by the patient (or if unable, by the patient's family).  Yes   Patient/family informed of their freedom to choose among providers that offer the needed level of care, that participate in Medicare, Medicaid or managed care program needed by the patient, have an available bed and are willing to accept the patient.  Yes   Patient/family informed of [facility]'s ownership interest in [facility] and [facility], as well as of the fact that they are under no obligation to receive care at these facilities.  PASRR submitted to EDS on 03/29/16     PASRR number received on 03/29/16     Existing PASRR number confirmed on       FL2 transmitted to all facilities in geographic area requested by pt/family on 03/29/16     FL2 transmitted to all facilities within larger geographic area on       Patient informed that his/her managed care company has contracts with or will negotiate with certain facilities, including the following:        Yes   Patient/family informed of bed offers received.  Patient chooses bed at  ([facility])     Physician recommends and patient chooses bed at      Patient to be transferred to  ([facility]) on 03/31/16.  Patient to be transferred to facility by EMS     Patient family notified on 03/31/16 of transfer.  Name of family member notified:  [place]     PHYSICIAN Please sign FL2     Additional Comment:     _______________________________________________ Baldemar Lenis, LCSW 03/31/2016, 2:57 PM

## 2016-03-31 NOTE — Discharge Summary (Signed)
[facility] [place] NAME: Andrew [name]    MR#:  [id number]  DATE OF BIRTH:  1922/07/23  DATE OF ADMISSION:  03/27/2016 ADMITTING PHYSICIAN: Lytle Butte, MD  DATE OF DISCHARGE: 03/31/2016  PRIMARY CARE PHYSICIAN: Tama High III, MD    ADMISSION DIAGNOSIS:  Cellulitis of right lower extremity [L03.115] Noncompliance with medications [Z91.14] Acute pain of right lower extremity [M79.604] Ischemia of lower extremity [I99.8]  DISCHARGE DIAGNOSIS:  Principal Problem:   Cellulitis Active Problems:   Cold foot with peripheral vascular disease ([place])   SECONDARY DIAGNOSIS:   Past Medical History:  Diagnosis Date  . Asthma   . Chronic kidney disease   . Hypertension   . Peripheral vascular disease [facility])     HOSPITAL COURSE:   82 year old male last medical history of hypertension, chronic kidney disease, peripheral vascular disease, asthma who presented to the hospital due to right lower extremity redness swelling and pain.  1. Lower extremity redness and pain-secondary to cellulitis with ulcer. -Seen by vascular surgery status post angiogram and angioplasty and clinically much improved. - pt. Was empirically treated with IV Vancomycin and has improved.  Blood cultures have been (-). Pt. Now being discharged on Oral Bactrim.   - he will Continue Eliquis as per Vascular surgery  2. Hx of PVD - cont. Eliquis, ASA, Statin  3. HTN - pt. bP remained stable and he will cont. Losartan, HCTZ.   4. Hypothyroidism - he will cont. Synthroid.   5. GERD - he will resume his Omeprazole.   6. COPD - no acute exacerbation while in the hospital.  - pt. Will cont. Advair, Spiriva    DISCHARGE CONDITIONS:   Stable  CONSULTS OBTAINED:  Treatment Team:  Algernon Huxley, MD  DRUG ALLERGIES:   Allergies  Allergen Reactions  . Keflex [Cephalexin] Other (See Comments)    Patient states this medication made his legs hurt really bad.     DISCHARGE MEDICATIONS:     Medication List    STOP taking these medications   cephALEXin 500 MG capsule Commonly known as:  KEFLEX   clopidogrel 75 MG tablet Commonly known as:  PLAVIX     TAKE these medications   apixaban 2.5 MG Tabs tablet Commonly known as:  ELIQUIS Take 1 tablet (2.5 mg total) by mouth 2 (two) times daily.   aspirin EC 81 MG tablet Take 1 tablet (81 mg total) by mouth daily.   atorvastatin 20 MG tablet Commonly known as:  LIPITOR Take 1 tablet (20 mg total) by mouth daily.   Cholecalciferol 1000 units capsule Take 1,000 Units by mouth every morning.   fexofenadine 180 MG tablet Commonly known as:  ALLEGRA Take 180 mg by mouth daily.   fluticasone 27.5 MCG/SPRAY nasal spray Commonly known as:  VERAMYST Place 2 sprays into the nose daily.   Fluticasone-Salmeterol 250-50 MCG/DOSE Aepb Commonly known as:  ADVAIR Inhale 1 puff into the lungs 2 (two) times daily.   hydrochlorothiazide 12.5 MG capsule Commonly known as:  MICROZIDE Take 12.5 mg by mouth every morning.   levothyroxine 75 MCG tablet Commonly known as:  SYNTHROID, LEVOTHROID Take 75 mcg by mouth daily before breakfast.   losartan 25 MG tablet Commonly known as:  COZAAR Take 25 mg by mouth every morning.   omeprazole 20 MG capsule Commonly known as:  PRILOSEC Take 20 mg by mouth daily.   PRESERVISION/LUTEIN Caps Take 1 capsule by mouth 2 (two) times  daily.   SPIRIVA HANDIHALER 18 MCG inhalation capsule Generic drug:  tiotropium Place 18 mcg into inhaler and inhale daily.   sulfamethoxazole-trimethoprim 800-160 MG tablet Commonly known as:  BACTRIM DS,SEPTRA DS Take 1 tablet by mouth 2 (two) times daily.   traMADol 50 MG tablet Commonly known as:  ULTRAM Take 1 tablet (50 mg total) by mouth every 6 (six) hours as needed.   Vitamin B-12 2500 MCG Subl Place 2,500 mcg under the tongue every morning.   vitamin E 400 UNIT capsule Take 400 Units by mouth every  morning.         DISCHARGE INSTRUCTIONS:   DIET:  Cardiac diet  DISCHARGE CONDITION:  Stable  ACTIVITY:  Activity as tolerated  OXYGEN:  Home Oxygen: No.   Oxygen Delivery: room air  DISCHARGE LOCATION:  nursing home   If you experience worsening of your admission symptoms, develop shortness of breath, life threatening emergency, suicidal or homicidal thoughts you must seek medical attention immediately by calling 911 or calling your MD immediately  if symptoms less severe.  You Must read complete instructions/literature along with all the possible adverse reactions/side effects for all the Medicines you take and that have been prescribed to you. Take any new Medicines after you have completely understood and accpet all the possible adverse reactions/side effects.   Please note  You were cared for by a hospitalist during your hospital stay. If you have any questions about your discharge medications or the care you received while you were in the hospital after you are discharged, you can call the unit and asked to speak with the hospitalist on call if the hospitalist that took care of you is not available. Once you are discharged, your primary care physician will handle any further medical issues. Please note that NO REFILLS for any discharge medications will be authorized once you are discharged, as it is imperative that you return to your primary care physician (or establish a relationship with a primary care physician if you do not have one) for your aftercare needs so that they can reassess your need for medications and monitor your lab values.     Today   Sitting up in chair in NAD.  RLE redness and pain improved.  Afebrile, hemodynamically stable.   VITAL SIGNS:  Blood pressure 130/65, pulse 95, temperature 98 F (36.7 C), temperature source Oral, resp. rate 18, height 5\' 11"  (1.803 m), weight 81.3 kg (179 lb 3.7 oz), SpO2 97 %.  I/O:   Intake/Output Summary (Last  24 hours) at 03/31/16 1008 Last data filed at 03/31/16 0925  Gross per 24 hour  Intake              920 ml  Output              525 ml  Net              395 ml    PHYSICAL EXAMINATION:  GENERAL:  82 y.o.-year-old patient lying in the bed with no acute distress.  EYES: Pupils equal, round, reactive to light and accommodation. No scleral icterus. Extraocular muscles intact.  HEENT: Head atraumatic, normocephalic. Oropharynx and nasopharynx clear.  NECK:  Supple, no jugular venous distention. No thyroid enlargement, no tenderness.  LUNGS: Normal breath sounds bilaterally, no wheezing, rales,rhonchi. No use of accessory muscles of respiration.  CARDIOVASCULAR: S1, S2 normal. No murmurs, rubs, or gallops.  ABDOMEN: Soft, non-tender, non-distended. Bowel sounds present. No organomegaly or mass. EXTREMITIES: No pedal edema,  cyanosis, or clubbing. RLE cellulitis w/ ulcer w/ dressing on it.  Minimal pain on palpations.   NEUROLOGIC: Cranial nerves II through XII are intact. No focal motor or sensory defecits b/l. Globally weak.  PSYCHIATRIC: The patient is alert and oriented x 3. Good affect.  SKIN: No obvious rash, lesion, or ulcer.   DATA REVIEW:   CBC  Recent Labs Lab 03/30/16 0500  WBC 10.6  HGB 10.7*  HCT 30.3*  PLT 502*    Chemistries   Recent Labs Lab 03/31/16 0419  NA 136  K 3.8  CL 109  CO2 22  GLUCOSE 111*  BUN 27*  CREATININE 1.14  CALCIUM 8.3*  MG 2.0    Cardiac Enzymes No results for input(s): TROPONINI in the last 168 hours.  Microbiology Results  Results for orders placed or performed during the hospital encounter of 03/27/16  Blood culture (routine x 2)     Status: None (Preliminary result)   Collection Time: 03/27/16  8:29 AM  Result Value Ref Range Status   Specimen Description BLOOD LEFT AC  Final   Special Requests BOTTLES DRAWN AEROBIC AND ANAEROBIC 9ML  Final   Culture NO GROWTH 4 DAYS  Final   Report Status PENDING  Incomplete  Blood culture  (routine x 2)     Status: None (Preliminary result)   Collection Time: 03/27/16  8:29 AM  Result Value Ref Range Status   Specimen Description BLOOD LEFT AC  Final   Special Requests BOTTLES DRAWN AEROBIC AND ANAEROBIC 9ML  Final   Culture NO GROWTH 4 DAYS  Final   Report Status PENDING  Incomplete  Urine culture     Status: None   Collection Time: 03/27/16 11:57 AM  Result Value Ref Range Status   Specimen Description URINE, RANDOM  Final   Special Requests NONE  Final   Culture NO GROWTH Performed at [facility]   Final   Report Status 03/28/2016 FINAL  Final  MRSA PCR Screening     Status: None   Collection Time: 03/27/16  6:45 PM  Result Value Ref Range Status   MRSA by PCR NEGATIVE NEGATIVE Final    Comment:        The GeneXpert MRSA Assay (FDA approved for NASAL specimens only), is one component of a comprehensive MRSA colonization surveillance program. It is not intended to diagnose MRSA infection nor to guide or monitor treatment for MRSA infections.     RADIOLOGY:  No results found.    Management plans discussed with the patient, family and they are in agreement.  CODE STATUS:     Code Status Orders        Start     Ordered   03/27/16 1739  Full code  Continuous     03/27/16 1739    Code Status History    Date Active Date Inactive Code Status Order ID Comments User Context   02/07/2016 12:31 PM 02/07/2016  6:41 PM Full Code [id number]  Algernon Huxley, MD Inpatient      TOTAL TIME TAKING CARE OF THIS PATIENT: 40 minutes.    Henreitta Leber M.D on 03/31/2016 at 10:08 AM  Between 7am to 6pm - Pager - [phone]  After 6pm go to [email or web address] - Proofreader  [facility]  Office  [phone]  CC: Primary care physician; Adin Hector, MD

## 2016-03-31 NOTE — Progress Notes (Signed)
[facility]  Daily Progress Note  Subjective: 3 Day Post-Op: angiogram through existing catheter RLE, Catheter placement into right posterior tibial artery from left femoral approach, Mechanical rheolytic thrombectomy with the AngioJet on the catheter to the right popliteal, tibioperoneal trunk, and posterior tibial arteries, Percutaneous transluminal angioplasty of the posterior tibial artery and tibioperoneal trunk with 3 mm diameter by 22cm length angioplasty balloon, Percutaneous transluminal angioplasty of the right popliteal artery with 4 mm diameter by 10 cm length Lutonix drug-coated angioplasty balloon, Tigrisstent placement to the right popliteal artery with 5 mm diameter by 10 cm length stent, Tigris stent placement to the right posterior tibial artery with 5 mm diameter by 23 m length stent with StarClose closure device leftfemoral artery.  4 Day Post-Op: Ultrasound guidance for vascular access leftfemoral artery, Catheter placement into right posterior tibial artery and peroneal arteries from left femoral approach Aortogram and selective rightlower extremity angiogram, Catheter directed thrombolytic therapy with 4 mg of TPA instilled in the right SFA, popliteal artery, tibioperoneal trunk, and peroneal arteries, Mechanical rheolytic thrombectomy with the AngioJet Omni catheter in the right SFA, popliteal artery, tibioperoneal trunk, peroneal artery, and posterior tibial artery. Percutaneous transluminal angioplasty of the peroneal artery and tibioperoneal trunk with 3 mm diameter angioplasty balloon. Percutaneous transluminal angioplasty of the entire SFA and popliteal artery with 6 mm diameter angioplasty balloon including a Lutonix drug-coated balloon proximally. Percutaneous transluminal angioplasty of the posterior tibial artery and tibioperoneal trunk with 3 mm diameter angioplasty balloon, Placement of a thrombolytic catheter for continuous infusion of TPA using  a 130 centimeters total length 50 cm working.  Patient sitting in bed eating lunch comfortably. Less pain today. Patient chose SNF - awaiting insurance approval.   Objective: Vitals:   03/30/16 1112 03/30/16 1507 03/30/16 1942 03/31/16 0432  BP: 115/62 114/66 120/62 130/65  Pulse: (!) 103 100 88 95  Resp: 18 16 18 18   Temp: 98.1 F (36.7 C) 98.6 F (37 C) 98.2 F (36.8 C) 98 F (36.7 C)  TempSrc: Oral Oral Oral Oral  SpO2: 95% 97% 96% 97%  Weight:      Height:        Intake/Output Summary (Last 24 hours) at 03/31/16 1325 Last data filed at 03/31/16 0925  Gross per 24 hour  Intake              920 ml  Output              525 ml  Net              395 ml   Physical Exam: A&Ox3, NAD CV: RRR Pulmonary: CTA Bilaterally Abdomen: Soft, Nontender, Nondistended Right Groin: No drainage or swelling noted. Vascular: Left Lower Extremity: Warm, PT pulses present   Laboratory: CBC    Component Value Date/Time   WBC 10.6 03/30/2016 0500   HGB 10.7 (L) 03/30/2016 0500   HCT 30.3 (L) 03/30/2016 0500   PLT 502 (H) 03/30/2016 0500   BMET    Component Value Date/Time   NA 136 03/31/2016 0419   K 3.8 03/31/2016 0419   CL 109 03/31/2016 0419   CO2 22 03/31/2016 0419   GLUCOSE 111 (H) 03/31/2016 0419   BUN 27 (H) 03/31/2016 0419   CREATININE 1.14 03/31/2016 0419   CALCIUM 8.3 (L) 03/31/2016 0419   GFRNONAA 54 (L) 03/31/2016 0419   GFRAA >60 03/31/2016 0419   Assessment/Planning: 82 year old male s/p left lower extremity angiogram for thrombosis and ulceration -  Improvement 1) Creatinine normal today 2) Patient selected SNF - awaiting insurance approval 3) OK to d/c to SNF when placement is secured from vascular standpoint.  4) Discussed with Dr. Ellis Parents Deaundre Allston PA-C 03/31/2016 1:25 PM

## 2016-03-31 NOTE — Progress Notes (Signed)
Clinical Social Worker was informed that patient will be medically ready to discharge to [facility]. Patient in a agreement with plan. CSW called Broadus John - Admissions Coordinator at [facility] to confirm that patient's bed is ready. Provided patient's room number [id number] and number to call for report [phone] . All discharge information faxed to [facility] via [facility].   Call to patient's nephew; to inform him patient would discharge to [facility]. RN will call report and patient will discharge to [facility] via EMS.  Ernest Pine, MSW, LCSW, [place] Clinical Social Worker [phone]

## 2016-03-31 NOTE — Progress Notes (Signed)
EMS here to take patient to [facility]

## 2016-03-31 NOTE — Progress Notes (Signed)
Presented bed offers. Accepted bed at [facility]. Informed Broadus John- Admissions Coordinator. [place] ([place]) and left voicemail to obtain auth number. Awaiting phone call back. CSW will continue to follow and assist.  Ernest Pine, MSW, LCSW, [place] Social Worker [phone]

## 2016-03-31 NOTE — Progress Notes (Signed)
Report called all questions answered.

## 2016-03-31 NOTE — Care Management Important Message (Signed)
Important Message  Patient Details  Name: Andrew [name] MRN: [id number] Date of Birth: 07/25/1922   Medicare Important Message Given:  Yes    Katrina Stack, RN 03/31/2016, 11:43 AM

## 2016-04-01 LAB — CULTURE, BLOOD (ROUTINE X 2)
CULTURE: NO GROWTH
Culture: NO GROWTH

## 2016-04-14 ENCOUNTER — Ambulatory Visit (INDEPENDENT_AMBULATORY_CARE_PROVIDER_SITE_OTHER): Payer: Medicare Other | Admitting: Vascular Surgery

## 2016-04-14 ENCOUNTER — Encounter (INDEPENDENT_AMBULATORY_CARE_PROVIDER_SITE_OTHER): Payer: Self-pay | Admitting: Vascular Surgery

## 2016-04-14 VITALS — BP 113/61 | HR 87 | Resp 15 | Ht 71.0 in | Wt 180.0 lb

## 2016-04-14 DIAGNOSIS — M7989 Other specified soft tissue disorders: Secondary | ICD-10-CM | POA: Insufficient documentation

## 2016-04-14 DIAGNOSIS — E785 Hyperlipidemia, unspecified: Secondary | ICD-10-CM | POA: Insufficient documentation

## 2016-04-14 DIAGNOSIS — L97311 Non-pressure chronic ulcer of right ankle limited to breakdown of skin: Secondary | ICD-10-CM | POA: Diagnosis not present

## 2016-04-14 DIAGNOSIS — I10 Essential (primary) hypertension: Secondary | ICD-10-CM

## 2016-04-14 DIAGNOSIS — I70233 Atherosclerosis of native arteries of right leg with ulceration of ankle: Secondary | ICD-10-CM | POA: Insufficient documentation

## 2016-04-14 NOTE — Assessment & Plan Note (Signed)
Significant improvement after revascularization. His pain is also markedly improved. Plan noninvasive studies in 1 month in follow-up. Continue local wound care.

## 2016-04-14 NOTE — Assessment & Plan Note (Signed)
blood pressure control important in reducing the progression of atherosclerotic disease. On appropriate oral medications.  

## 2016-04-14 NOTE — Assessment & Plan Note (Signed)
Continue elevation and consider compression.

## 2016-04-14 NOTE — Assessment & Plan Note (Signed)
This is improved after revascularization but still present. Continue local wound care. Recheck in one month

## 2016-04-14 NOTE — Progress Notes (Signed)
MRN : [id number]  Andrew [name] is a 82 y.o. (1922-12-11) male who presents with chief complaint of  Chief Complaint  Patient presents with  . Re-evaluation    Follow up  .  History of Present Illness: Patient returns today in follow up of Of his peripheral vascular disease and ulceration of the right lower extremity. His foot feels much better after revascularization. He still has significant swelling that is stable. The ulceration has improved but remains present. He has no fever or chills or signs of infection.  Current Outpatient Prescriptions  Medication Sig Dispense Refill  . apixaban (ELIQUIS) 2.5 MG TABS tablet Take 1 tablet (2.5 mg total) by mouth 2 (two) times daily. 60 tablet   . aspirin EC 81 MG tablet Take 1 tablet (81 mg total) by mouth daily. 150 tablet 2  . atorvastatin (LIPITOR) 20 MG tablet Take 1 tablet (20 mg total) by mouth daily. 30 tablet 3  . Cholecalciferol 1000 units capsule Take 1,000 Units by mouth every morning.    . clopidogrel (PLAVIX) 75 MG tablet   10  . Cyanocobalamin (VITAMIN B-12) 2500 MCG SUBL Place 2,500 mcg under the tongue every morning.    . fexofenadine (ALLEGRA) 180 MG tablet Take 180 mg by mouth daily.    . fluticasone (VERAMYST) 27.5 MCG/SPRAY nasal spray Place 2 sprays into the nose daily.    . Fluticasone-Salmeterol (ADVAIR) 250-50 MCG/DOSE AEPB Inhale 1 puff into the lungs 2 (two) times daily.    . hydrochlorothiazide (MICROZIDE) 12.5 MG capsule Take 12.5 mg by mouth every morning.     Marland Kitchen levothyroxine (SYNTHROID, LEVOTHROID) 75 MCG tablet Take 75 mcg by mouth daily before breakfast.    . losartan (COZAAR) 25 MG tablet Take 25 mg by mouth every morning.  12  . Multiple Vitamins-Minerals (PRESERVISION/LUTEIN) CAPS Take 1 capsule by mouth 2 (two) times daily.    Marland Kitchen omeprazole (PRILOSEC) 20 MG capsule Take 20 mg by mouth daily.    Marland Kitchen tiotropium (SPIRIVA HANDIHALER) 18 MCG inhalation capsule Place 18 mcg into inhaler and inhale daily.    .  traMADol (ULTRAM) 50 MG tablet Take 1 tablet (50 mg total) by mouth every 6 (six) hours as needed. 40 tablet 0  . vitamin E 400 UNIT capsule Take 400 Units by mouth every morning.      No current facility-administered medications for this visit.     Past Medical History:  Diagnosis Date  . Asthma   . Chronic kidney disease   . Hypertension   . Peripheral vascular disease [facility])     Past Surgical History:  Procedure Laterality Date  . PERIPHERAL VASCULAR CATHETERIZATION Right 02/07/2016   Procedure: Lower Extremity Angiography;  Surgeon: Algernon Huxley, MD;  Location: [place] [facility];  Service: Cardiovascular;  Laterality: Right;  . PERIPHERAL VASCULAR CATHETERIZATION Right 03/27/2016   Procedure: Lower Extremity Angiography;  Surgeon: Algernon Huxley, MD;  Location: [place] [facility];  Service: Cardiovascular;  Laterality: Right;  . PERIPHERAL VASCULAR CATHETERIZATION  03/27/2016   Procedure: Lower Extremity Intervention;  Surgeon: Algernon Huxley, MD;  Location: [place] [facility];  Service: Cardiovascular;;  . PERIPHERAL VASCULAR CATHETERIZATION Right 03/28/2016   Procedure: Lower Extremity Angiography;  Surgeon: Algernon Huxley, MD;  Location: [place] [facility];  Service: Cardiovascular;  Laterality: Right;  . SHOULDER SURGERY  1974  . UPPER GI ENDOSCOPY      Social History Social History  Substance Use Topics  . Smoking  status: Former Research scientist (life sciences)  . Smokeless tobacco: Former Systems developer    Quit date: 05/16/1993  . Alcohol use No     Family History Family History  Problem Relation Age of Onset  . Heart failure Mother   . Stroke Father      Allergies  Allergen Reactions  . Keflex [Cephalexin] Other (See Comments)    Patient states this medication made his legs hurt really bad.     REVIEW OF SYSTEMS (Negative unless checked)  Constitutional: [] Weight loss  [] Fever  [] Chills Cardiac: [] Chest pain   [] Chest pressure   [] Palpitations   [] Shortness of breath when laying flat    [] Shortness of breath at rest   [] Shortness of breath with exertion. Vascular:  [] Pain in legs with walking   [] Pain in legs at rest   [] Pain in legs when laying flat   [] Claudication   [] Pain in feet when walking  [] Pain in feet at rest  [] Pain in feet when laying flat   [] History of DVT   [] Phlebitis   [x] Swelling in legs   [] Varicose veins   [x] Non-healing ulcers Pulmonary:   [] Uses home oxygen   [] Productive cough   [] Hemoptysis   [] Wheeze  [] COPD   [] Asthma Neurologic:  [] Dizziness  [] Blackouts   [] Seizures   [] History of stroke   [] History of TIA  [] Aphasia   [] Temporary blindness   [] Dysphagia   [] Weakness or numbness in arms   [] Weakness or numbness in legs Musculoskeletal:  [] Arthritis   [] Joint swelling   [] Joint pain   [] Low back pain Hematologic:  [] Easy bruising  [] Easy bleeding   [] Hypercoagulable state   [] Anemic   Gastrointestinal:  [] Blood in stool   [] Vomiting blood  [] Gastroesophageal reflux/heartburn   [] Abdominal pain Genitourinary:  [] Chronic kidney disease   [] Difficult urination  [] Frequent urination  [] Burning with urination   [] Hematuria Skin:  [] Rashes   [x] Ulcers   [x] Wounds Psychological:  [] History of anxiety   []  History of major depression.  Physical Examination  BP 113/61 (BP Location: Left Arm)   Pulse 87   Resp 15   Ht 5' 11"  (1.803 m)   Wt 81.6 kg (180 lb)   BMI 25.10 kg/m  Gen:  WD/WN, NAD Head: [place]/AT, No temporalis wasting. Ear/Nose/Throat: Hearing grossly intact, nares w/o erythema or drainage, trachea midline Eyes: PERRLA,. Sclera non-icteric Neck: Supple, no nuchal rigidity.  No JVD.  Pulmonary:  Good air movement, no use of accessory muscles.  Cardiac: RRR, normal S1, S2 Vascular:  Vessel Right Left  Radial Palpable Palpable  Ulnar Palpable Palpable  Brachial Palpable Palpable  Carotid Palpable, without bruit Palpable, without bruit  Aorta Not palpable N/A  Femoral Palpable Palpable  Popliteal Palpable Palpable  PT Not Palpable Not  Palpable  DP 1+ Palpable Palpable   Gastrointestinal: soft, non-tender/non-distended. No guarding/reflex.  Musculoskeletal: M/S 5/5 throughout.  No deformity or atrophy. 2-3+ right lower extremity edema and 1-2+ left lower extremity edema. Neurologic: CN 2-12 intact. Pain and light touch intact in extremities.  Symmetrical.  Speech is fluent.  Psychiatric: Judgment intact, Mood & affect appropriate for pt's clinical situation. Dermatologic: Scabs are present on several toes. Somewhat diminished capillary refill in the toes. 2-3 cm superficial ulceration just above the medial right ankle with fair granulation tissue and no surrounding erythema. Lymph : No Cervical, Axillary, or Inguinal lymphadenopathy.      Labs Recent Results (from the past 2160 hour(s))  Creatinine, serum     Status: Abnormal   Collection Time:  02/04/16  9:43 AM  Result Value Ref Range   Creatinine, Ser 1.51 (H) 0.61 - 1.24 mg/dL   GFR calc non Af Amer 39 (L) >60 mL/min   GFR calc Af Amer 45 (L) >60 mL/min    Comment: (NOTE) The eGFR has been calculated using the CKD EPI equation. This calculation has not been validated in all clinical situations. eGFR's persistently <60 mL/min signify possible Chronic Kidney Disease.   BUN     Status: Abnormal   Collection Time: 02/04/16  9:43 AM  Result Value Ref Range   BUN 35 (H) 6 - 20 mg/dL  CBC with Differential     Status: Abnormal   Collection Time: 03/27/16  8:29 AM  Result Value Ref Range   WBC 8.8 3.8 - 10.6 K/uL   RBC 3.86 (L) 4.40 - 5.90 MIL/uL   Hemoglobin 12.7 (L) 13.0 - 18.0 g/dL   HCT 36.5 (L) 40.0 - 52.0 %   MCV 94.6 80.0 - 100.0 fL   MCH 32.9 26.0 - 34.0 pg   MCHC 34.7 32.0 - 36.0 g/dL   RDW 16.2 (H) 11.5 - 14.5 %   Platelets 795 (H) 150 - 440 K/uL   Neutrophils Relative % 67 %   Neutro Abs 5.8 1.4 - 6.5 K/uL   Lymphocytes Relative 19 %   Lymphs Abs 1.7 1.0 - 3.6 K/uL   Monocytes Relative 8 %   Monocytes Absolute 0.7 0.2 - 1.0 K/uL   Eosinophils  Relative 4 %   Eosinophils Absolute 0.4 0 - 0.7 K/uL   Basophils Relative 2 %   Basophils Absolute 0.2 (H) 0 - 0.1 K/uL  Basic metabolic panel     Status: Abnormal   Collection Time: 03/27/16  8:29 AM  Result Value Ref Range   Sodium 139 135 - 145 mmol/L   Potassium 4.0 3.5 - 5.1 mmol/L   Chloride 109 101 - 111 mmol/L   CO2 25 22 - 32 mmol/L   Glucose, Bld 112 (H) 65 - 99 mg/dL   BUN 27 (H) 6 - 20 mg/dL   Creatinine, Ser 1.59 (H) 0.61 - 1.24 mg/dL   Calcium 9.1 8.9 - 10.3 mg/dL   GFR calc non Af Amer 36 (L) >60 mL/min   GFR calc Af Amer 42 (L) >60 mL/min    Comment: (NOTE) The eGFR has been calculated using the CKD EPI equation. This calculation has not been validated in all clinical situations. eGFR's persistently <60 mL/min signify possible Chronic Kidney Disease.    Anion gap 5 5 - 15  Blood culture (routine x 2)     Status: None   Collection Time: 03/27/16  8:29 AM  Result Value Ref Range   Specimen Description BLOOD LEFT AC    Special Requests BOTTLES DRAWN AEROBIC AND ANAEROBIC 9ML    Culture NO GROWTH 5 DAYS    Report Status 04/01/2016 FINAL   Blood culture (routine x 2)     Status: None   Collection Time: 03/27/16  8:29 AM  Result Value Ref Range   Specimen Description BLOOD LEFT AC    Special Requests BOTTLES DRAWN AEROBIC AND ANAEROBIC 9ML    Culture NO GROWTH 5 DAYS    Report Status 04/01/2016 FINAL   Sedimentation rate     Status: None   Collection Time: 03/27/16  8:29 AM  Result Value Ref Range   Sed Rate 2 0 - 20 mm/hr  C-reactive protein     Status: None   Collection  Time: 03/27/16  8:29 AM  Result Value Ref Range   CRP 0.6 <1.0 mg/dL    Comment: Performed at [facility]  Urinalysis complete, with microscopic [facility] only)     Status: Abnormal   Collection Time: 03/27/16 11:57 AM  Result Value Ref Range   Color, Urine YELLOW (A) YELLOW   APPearance CLEAR (A) CLEAR   Glucose, UA NEGATIVE NEGATIVE mg/dL   Bilirubin Urine NEGATIVE NEGATIVE    Ketones, ur NEGATIVE NEGATIVE mg/dL   Specific Gravity, Urine 1.013 1.005 - 1.030   Hgb urine dipstick NEGATIVE NEGATIVE   pH 6.0 5.0 - 8.0   Protein, ur 100 (A) NEGATIVE mg/dL   Nitrite NEGATIVE NEGATIVE   Leukocytes, UA NEGATIVE NEGATIVE   RBC / HPF NONE SEEN 0 - 5 RBC/hpf   WBC, UA 0-5 0 - 5 WBC/hpf   Bacteria, UA NONE SEEN NONE SEEN   Squamous Epithelial / LPF NONE SEEN NONE SEEN   Mucous PRESENT   Urine culture     Status: None   Collection Time: 03/27/16 11:57 AM  Result Value Ref Range   Specimen Description URINE, RANDOM    Special Requests NONE    Culture NO GROWTH Performed at [facility]     Report Status 03/28/2016 FINAL   Glucose, capillary     Status: Abnormal   Collection Time: 03/27/16  6:43 PM  Result Value Ref Range   Glucose-Capillary 115 (H) 65 - 99 mg/dL  MRSA PCR Screening     Status: None   Collection Time: 03/27/16  6:45 PM  Result Value Ref Range   MRSA by PCR NEGATIVE NEGATIVE    Comment:        The GeneXpert MRSA Assay (FDA approved for NASAL specimens only), is one component of a comprehensive MRSA colonization surveillance program. It is not intended to diagnose MRSA infection nor to guide or monitor treatment for MRSA infections.   Heparin level (unfractionated) every 6 hours x 4 post-procedure     Status: Abnormal   Collection Time: 03/27/16  7:07 PM  Result Value Ref Range   Heparin Unfractionated <0.10 (L) 0.30 - 0.70 IU/mL    Comment:        IF HEPARIN RESULTS ARE BELOW EXPECTED VALUES, AND PATIENT DOSAGE HAS BEEN CONFIRMED, SUGGEST FOLLOW UP TESTING OF ANTITHROMBIN III LEVELS.   CBC every 6 hours x 4 post-procedure     Status: Abnormal   Collection Time: 03/27/16  7:07 PM  Result Value Ref Range   WBC 14.3 (H) 3.8 - 10.6 K/uL   RBC 3.78 (L) 4.40 - 5.90 MIL/uL   Hemoglobin 12.1 (L) 13.0 - 18.0 g/dL   HCT 36.1 (L) 40.0 - 52.0 %   MCV 95.6 80.0 - 100.0 fL   MCH 32.0 26.0 - 34.0 pg   MCHC 33.5 32.0 - 36.0 g/dL   RDW  15.7 (H) 11.5 - 14.5 %   Platelets 824 (H) 150 - 440 K/uL  Fibrinogen every 6 hours x 4 post-procedure     Status: None   Collection Time: 03/27/16  7:07 PM  Result Value Ref Range   Fibrinogen 287 210 - 475 mg/dL  Creatinine, serum     Status: Abnormal   Collection Time: 03/27/16  7:07 PM  Result Value Ref Range   Creatinine, Ser 1.18 0.61 - 1.24 mg/dL   GFR calc non Af Amer 52 (L) >60 mL/min   GFR calc Af Amer >60 >60 mL/min    Comment: (  NOTE) The eGFR has been calculated using the CKD EPI equation. This calculation has not been validated in all clinical situations. eGFR's persistently <60 mL/min signify possible Chronic Kidney Disease.   Magnesium     Status: Abnormal   Collection Time: 03/27/16  7:07 PM  Result Value Ref Range   Magnesium 1.6 (L) 1.7 - 2.4 mg/dL  Heparin level (unfractionated) every 6 hours x 4 post-procedure     Status: Abnormal   Collection Time: 03/27/16 11:40 PM  Result Value Ref Range   Heparin Unfractionated <0.10 (L) 0.30 - 0.70 IU/mL    Comment:        IF HEPARIN RESULTS ARE BELOW EXPECTED VALUES, AND PATIENT DOSAGE HAS BEEN CONFIRMED, SUGGEST FOLLOW UP TESTING OF ANTITHROMBIN III LEVELS.   CBC every 6 hours x 4 post-procedure     Status: Abnormal   Collection Time: 03/27/16 11:40 PM  Result Value Ref Range   WBC 12.9 (H) 3.8 - 10.6 K/uL   RBC 3.61 (L) 4.40 - 5.90 MIL/uL   Hemoglobin 11.8 (L) 13.0 - 18.0 g/dL   HCT 34.4 (L) 40.0 - 52.0 %   MCV 95.3 80.0 - 100.0 fL   MCH 32.5 26.0 - 34.0 pg   MCHC 34.2 32.0 - 36.0 g/dL   RDW 16.1 (H) 11.5 - 14.5 %   Platelets 611 (H) 150 - 440 K/uL  Fibrinogen every 6 hours x 4 post-procedure     Status: None   Collection Time: 03/27/16 11:40 PM  Result Value Ref Range   Fibrinogen 226 210 - 475 mg/dL  Heparin level (unfractionated) every 6 hours x 4 post-procedure     Status: Abnormal   Collection Time: 03/28/16  5:34 AM  Result Value Ref Range   Heparin Unfractionated 0.10 (L) 0.30 - 0.70 IU/mL     Comment:        IF HEPARIN RESULTS ARE BELOW EXPECTED VALUES, AND PATIENT DOSAGE HAS BEEN CONFIRMED, SUGGEST FOLLOW UP TESTING OF ANTITHROMBIN III LEVELS.   CBC every 6 hours x 4 post-procedure     Status: Abnormal   Collection Time: 03/28/16  5:34 AM  Result Value Ref Range   WBC 10.7 (H) 3.8 - 10.6 K/uL   RBC 3.56 (L) 4.40 - 5.90 MIL/uL   Hemoglobin 11.8 (L) 13.0 - 18.0 g/dL   HCT 34.0 (L) 40.0 - 52.0 %   MCV 95.5 80.0 - 100.0 fL   MCH 33.1 26.0 - 34.0 pg   MCHC 34.7 32.0 - 36.0 g/dL   RDW 16.0 (H) 11.5 - 14.5 %   Platelets 524 (H) 150 - 440 K/uL  Fibrinogen every 6 hours x 4 post-procedure     Status: Abnormal   Collection Time: 03/28/16  5:34 AM  Result Value Ref Range   Fibrinogen 173 (L) 210 - 475 mg/dL  Basic metabolic panel     Status: Abnormal   Collection Time: 03/28/16  5:34 AM  Result Value Ref Range   Sodium 137 135 - 145 mmol/L   Potassium 3.9 3.5 - 5.1 mmol/L   Chloride 110 101 - 111 mmol/L   CO2 22 22 - 32 mmol/L   Glucose, Bld 135 (H) 65 - 99 mg/dL   BUN 22 (H) 6 - 20 mg/dL   Creatinine, Ser 1.15 0.61 - 1.24 mg/dL   Calcium 8.4 (L) 8.9 - 10.3 mg/dL   GFR calc non Af Amer 54 (L) >60 mL/min   GFR calc Af Amer >60 >60 mL/min    Comment: (  NOTE) The eGFR has been calculated using the CKD EPI equation. This calculation has not been validated in all clinical situations. eGFR's persistently <60 mL/min signify possible Chronic Kidney Disease.    Anion gap 5 5 - 15  CBC     Status: Abnormal   Collection Time: 03/29/16  5:00 AM  Result Value Ref Range   WBC 11.5 (H) 3.8 - 10.6 K/uL   RBC 3.28 (L) 4.40 - 5.90 MIL/uL   Hemoglobin 10.8 (L) 13.0 - 18.0 g/dL   HCT 30.8 (L) 40.0 - 52.0 %   MCV 94.1 80.0 - 100.0 fL   MCH 33.0 26.0 - 34.0 pg   MCHC 35.1 32.0 - 36.0 g/dL   RDW 16.2 (H) 11.5 - 14.5 %   Platelets 535 (H) 150 - 440 K/uL  Basic metabolic panel     Status: Abnormal   Collection Time: 03/29/16  5:00 AM  Result Value Ref Range   Sodium 136 135 - 145  mmol/L   Potassium 3.5 3.5 - 5.1 mmol/L   Chloride 107 101 - 111 mmol/L   CO2 23 22 - 32 mmol/L   Glucose, Bld 149 (H) 65 - 99 mg/dL   BUN 31 (H) 6 - 20 mg/dL   Creatinine, Ser 1.62 (H) 0.61 - 1.24 mg/dL   Calcium 8.1 (L) 8.9 - 10.3 mg/dL   GFR calc non Af Amer 35 (L) >60 mL/min   GFR calc Af Amer 41 (L) >60 mL/min    Comment: (NOTE) The eGFR has been calculated using the CKD EPI equation. This calculation has not been validated in all clinical situations. eGFR's persistently <60 mL/min signify possible Chronic Kidney Disease.    Anion gap 6 5 - 15  Magnesium     Status: None   Collection Time: 03/29/16  5:00 AM  Result Value Ref Range   Magnesium 1.7 1.7 - 2.4 mg/dL  CBC     Status: Abnormal   Collection Time: 03/30/16  5:00 AM  Result Value Ref Range   WBC 10.6 3.8 - 10.6 K/uL   RBC 3.21 (L) 4.40 - 5.90 MIL/uL   Hemoglobin 10.7 (L) 13.0 - 18.0 g/dL   HCT 30.3 (L) 40.0 - 52.0 %   MCV 94.3 80.0 - 100.0 fL   MCH 33.3 26.0 - 34.0 pg   MCHC 35.4 32.0 - 36.0 g/dL   RDW 16.3 (H) 11.5 - 14.5 %   Platelets 502 (H) 150 - 440 K/uL  Basic metabolic panel     Status: Abnormal   Collection Time: 03/30/16  5:00 AM  Result Value Ref Range   Sodium 137 135 - 145 mmol/L   Potassium 3.7 3.5 - 5.1 mmol/L   Chloride 110 101 - 111 mmol/L   CO2 23 22 - 32 mmol/L   Glucose, Bld 117 (H) 65 - 99 mg/dL   BUN 31 (H) 6 - 20 mg/dL   Creatinine, Ser 1.44 (H) 0.61 - 1.24 mg/dL   Calcium 8.2 (L) 8.9 - 10.3 mg/dL   GFR calc non Af Amer 41 (L) >60 mL/min   GFR calc Af Amer 47 (L) >60 mL/min    Comment: (NOTE) The eGFR has been calculated using the CKD EPI equation. This calculation has not been validated in all clinical situations. eGFR's persistently <60 mL/min signify possible Chronic Kidney Disease.    Anion gap 4 (L) 5 - 15  Magnesium     Status: Abnormal   Collection Time: 03/30/16  5:00 AM  Result Value  Ref Range   Magnesium 1.6 (L) 1.7 - 2.4 mg/dL  Magnesium     Status: None    Collection Time: 03/31/16  4:19 AM  Result Value Ref Range   Magnesium 2.0 1.7 - 2.4 mg/dL  Basic metabolic panel     Status: Abnormal   Collection Time: 03/31/16  4:19 AM  Result Value Ref Range   Sodium 136 135 - 145 mmol/L   Potassium 3.8 3.5 - 5.1 mmol/L   Chloride 109 101 - 111 mmol/L   CO2 22 22 - 32 mmol/L   Glucose, Bld 111 (H) 65 - 99 mg/dL   BUN 27 (H) 6 - 20 mg/dL   Creatinine, Ser 1.14 0.61 - 1.24 mg/dL   Calcium 8.3 (L) 8.9 - 10.3 mg/dL   GFR calc non Af Amer 54 (L) >60 mL/min   GFR calc Af Amer >60 >60 mL/min    Comment: (NOTE) The eGFR has been calculated using the CKD EPI equation. This calculation has not been validated in all clinical situations. eGFR's persistently <60 mL/min signify possible Chronic Kidney Disease.    Anion gap 5 5 - 15    Radiology Dg Chest 2 View  Result Date: 03/27/2016 CLINICAL DATA:  Shortness of breath, right lower extremity wound undergoing care inter wound care facility. EXAM: CHEST  2 VIEW COMPARISON:  CT scan of the chest of September 22, 2010 FINDINGS: The lungs are mildly hyperinflated with hemidiaphragm flattening. There is no focal infiltrate. The interstitial markings are coarse. There are chronically increased lung markings in the left perihilar region. The heart is top-normal in size. The pulmonary vascularity is not engorged. The mediastinum is normal in width. There is multilevel degenerative disc disease of the thoracic spine. There is calcification in the wall of the thoracic aorta. IMPRESSION: COPD. Chronic scarring or other stable process in the left upper lobe inferiorly. Top-normal cardiac size without pulmonary edema. Aortic atherosclerosis. Electronically Signed   By: David  Martinique M.D.   On: 03/27/2016 09:34   Dg Tibia/fibula Right  Result Date: 03/27/2016 CLINICAL DATA:  82 year old male with right lower leg wound and extremity swelling. Initial encounter. EXAM: RIGHT TIBIA AND FIBULA - 2 VIEW COMPARISON:  None. FINDINGS:  Long segment vascular stenting in the distal right thigh extending to the popliteal fossa. Other calcified peripheral vascular disease in the proximal calf. Widespread subcutaneous stranding and soft tissue swelling. No subcutaneous gas. The reported discrete soft tissue wound may be along the distal tibia just above the medial malleolus on the AP view. Bone mineralization is within normal limits for age. Alignment at the right knee and ankle is preserved. Degenerative changes at the joints. No acute osseous abnormality identified. IMPRESSION: 1.  No acute osseous abnormality about the right tib-fib. 2. Soft tissue swelling and evidence of widespread subcutaneous edema but no subcutaneous gas identified. Electronically Signed   By: Genevie Ann M.D.   On: 03/27/2016 10:22   US Venous Img Lower Unilateral Right  Result Date: 03/27/2016 CLINICAL DATA:  82 year old male with a history of swelling EXAM: RIGHT LOWER EXTREMITY VENOUS DOPPLER ULTRASOUND TECHNIQUE: Gray-scale sonography with graded compression, as well as color Doppler and duplex ultrasound were performed to evaluate the lower extremity deep venous systems from the level of the common femoral vein and including the common femoral, femoral, profunda femoral, popliteal and calf veins including the posterior tibial, peroneal and gastrocnemius veins when visible. The superficial great saphenous vein was also interrogated. Spectral Doppler was utilized to evaluate flow  at rest and with distal augmentation maneuvers in the common femoral, femoral and popliteal veins. COMPARISON:  None. FINDINGS: Contralateral Common Femoral Vein: Respiratory phasicity is normal and symmetric with the symptomatic side. No evidence of thrombus. Normal compressibility. Common Femoral Vein: No evidence of thrombus. Normal compressibility, respiratory phasicity and response to augmentation. Saphenofemoral Junction: No evidence of thrombus. Normal compressibility and flow on color  Doppler imaging. Profunda Femoral Vein: No evidence of thrombus. Normal compressibility and flow on color Doppler imaging. Femoral Vein: No evidence of thrombus. Normal compressibility, respiratory phasicity and response to augmentation. Popliteal Vein: No evidence of thrombus. Normal compressibility, respiratory phasicity and response to augmentation. Calf Veins: No evidence of thrombus. Normal compressibility and flow on color Doppler imaging. Superficial Great Saphenous Vein: No evidence of thrombus. Normal compressibility and flow on color Doppler imaging. Other Findings:  None. IMPRESSION: Sonographic survey of the right lower extremity negative for DVT. Signed, Dulcy Fanny. Earleen Newport, DO [facility] Electronically Signed   By: Corrie Mckusick D.O.   On: 03/27/2016 11:15   Dg Chest Port 1 View  Result Date: 03/27/2016 CLINICAL DATA:  Admitted for lower extremity wound care. Central line placement. EXAM: PORTABLE CHEST 1 VIEW COMPARISON:  Chest x-ray from earlier same day. FINDINGS: Right IJ central line has been placed in the interval. Tip of the line is adequately positioned at the level of the mid SVC. No other interval change. No pneumothorax. IMPRESSION: Right IJ central line placement with tip adequately positioned at the level of the mid SVC. Could consider advancing approximately 4-5 cm for more optimal radiographic positioning at the cavoatrial junction. Electronically Signed   By: Franki Cabot M.D.   On: 03/27/2016 20:13   Dg Foot Complete Right  Result Date: 03/27/2016 CLINICAL DATA:  82 year old male with right lower leg wound and extremity swelling. Initial encounter. EXAM: RIGHT FOOT COMPLETE - 3+ VIEW COMPARISON:  Right foot series 03/10/2011. Right tib-fib series from today reported separately. FINDINGS: Bone mineralization remains normal for age. Right foot osseous structures appear stable and intact. Suggestion of soft tissue wound  proximal to the medial malleolus again noted (image 2). No subcutaneous gas in the foot. No acute osseous abnormality identified. IMPRESSION: No acute osseous abnormality in the right foot. Electronically Signed   By: Genevie Ann M.D.   On: 03/27/2016 10:23      Assessment/Plan  Lower limb ulcer, ankle, right, limited to breakdown of skin ([place]) This is improved after revascularization but still present. Continue local wound care. Recheck in one month  Hyperlipidemia lipid control important in reducing the progression of atherosclerotic disease. Continue statin therapy   Essential hypertension blood pressure control important in reducing the progression of atherosclerotic disease. On appropriate oral medications.   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 His pain is also markedly improved. Plan noninvasive studies in 1 month in follow-up. Continue local wound care.  Swelling of limb Continue elevation and consider compression.    Leotis Pain, MD  04/14/2016 4:48 PM    This note was created with Dragon medical transcription system.  Any errors from dictation are purely unintentional

## 2016-04-14 NOTE — Assessment & Plan Note (Signed)
lipid control important in reducing the progression of atherosclerotic disease. Continue statin therapy  

## 2016-05-16 ENCOUNTER — Ambulatory Visit (INDEPENDENT_AMBULATORY_CARE_PROVIDER_SITE_OTHER): Payer: Medicare Other | Admitting: Podiatry

## 2016-05-16 DIAGNOSIS — M79609 Pain in unspecified limb: Secondary | ICD-10-CM

## 2016-05-16 DIAGNOSIS — L608 Other nail disorders: Secondary | ICD-10-CM

## 2016-05-16 DIAGNOSIS — L603 Nail dystrophy: Secondary | ICD-10-CM

## 2016-05-16 DIAGNOSIS — B351 Tinea unguium: Secondary | ICD-10-CM

## 2016-05-19 ENCOUNTER — Other Ambulatory Visit (INDEPENDENT_AMBULATORY_CARE_PROVIDER_SITE_OTHER): Payer: Self-pay | Admitting: Vascular Surgery

## 2016-05-19 DIAGNOSIS — I70233 Atherosclerosis of native arteries of right leg with ulceration of ankle: Secondary | ICD-10-CM

## 2016-05-22 ENCOUNTER — Encounter (INDEPENDENT_AMBULATORY_CARE_PROVIDER_SITE_OTHER): Payer: Self-pay

## 2016-05-22 ENCOUNTER — Ambulatory Visit (INDEPENDENT_AMBULATORY_CARE_PROVIDER_SITE_OTHER): Payer: Medicare Other

## 2016-05-22 ENCOUNTER — Encounter (INDEPENDENT_AMBULATORY_CARE_PROVIDER_SITE_OTHER): Payer: Self-pay | Admitting: Vascular Surgery

## 2016-05-22 ENCOUNTER — Ambulatory Visit (INDEPENDENT_AMBULATORY_CARE_PROVIDER_SITE_OTHER): Payer: Medicare Other | Admitting: Vascular Surgery

## 2016-05-22 VITALS — BP 144/70 | HR 112 | Resp 18 | Ht 67.0 in | Wt 183.0 lb

## 2016-05-22 DIAGNOSIS — I10 Essential (primary) hypertension: Secondary | ICD-10-CM

## 2016-05-22 DIAGNOSIS — E785 Hyperlipidemia, unspecified: Secondary | ICD-10-CM

## 2016-05-22 DIAGNOSIS — I70233 Atherosclerosis of native arteries of right leg with ulceration of ankle: Secondary | ICD-10-CM | POA: Diagnosis not present

## 2016-05-22 NOTE — H&P (Signed)
Subjective:    Patient ID: Andrew [name], male    DOB: [birth year], 82 y.o.   MRN: [id number] Chief Complaint  Patient presents with  . Re-evaluation    Ultrasound follow up   Patient last seen on 04/17/16 in follow up to a right lower extremity angiogram for PAD with ulceration and infection right lower extremity on 03/28/16. Patient states minimal healing to his right ankle wound. Patient has home nursing services visit at least three times a week for local wound care. The patient does wound care the rest of the time. He has also noticed increased right lower extremity pain "over the last few weeks". Denies fever, nausea or vomiting. The patient underwent an ABI which showed Right ABI: 0.52 and Left 0.97 (no previous for comparison). A right lower extremity arterial duplex is notable for an occluded SFA / popliteal stent no flow in the ATA and monophasic in the PTA.   Review of Systems  Constitutional: Negative.   HENT: Negative.   Eyes: Negative.   Respiratory: Negative.   Cardiovascular:       Right Claudication  Gastrointestinal: Negative.   Endocrine: Negative.   Genitourinary: Negative.   Musculoskeletal: Negative.   Skin: Positive for wound (Right Ankle).  Allergic/Immunologic: Negative.   Neurological: Negative.   Hematological: Negative.   Psychiatric/Behavioral: Negative.       Objective:   Physical Exam  Constitutional: He is oriented to person, place, and time. He appears well-developed and well-nourished.  HENT:  Head: Normocephalic and atraumatic.  Right Ear: External ear normal.  Left Ear: External ear normal.  Eyes: Conjunctivae and EOM are normal. Pupils are equal, round, and reactive to light.  Neck: Normal range of motion.  Cardiovascular: Normal rate, regular rhythm, normal heart sounds and intact distal pulses.   Pulses:      Popliteal pulses are 0 on the right side, and 2+ on the left side.       Dorsalis pedis pulses are 0 on the right side, and 2+  on the left side.       Posterior tibial pulses are 1+ on the right side, and 2+ on the left side.  Right Ankle Wound: Poorly healing. Non-infected. No granulation tissue.  Pulmonary/Chest: Effort normal and breath sounds normal.  Abdominal: Soft. Bowel sounds are normal.  Musculoskeletal: Normal range of motion. He exhibits edema (Right Foot and Calf).  Neurological: He is alert and oriented to person, place, and time.  Skin:  See above  Psychiatric: He has a normal mood and affect. His behavior is normal. Judgment and thought content normal.   BP (!) 144/70 (BP Location: Left Arm)   Pulse (!) 112   Resp 18   Ht 5\' 7"  (1.702 m)   Wt 183 lb (83 kg)   BMI 28.66 kg/m   Past Medical History:  Diagnosis Date  . Asthma   . Chronic kidney disease   . Hypertension   . Peripheral vascular disease [facility])    Social History   Social History  . Marital status: Unknown    Spouse name: N/A  . Number of children: N/A  . Years of education: N/A   Occupational History  . Not on file.   Social History Main Topics  . Smoking status: Former Research scientist (life sciences)  . Smokeless tobacco: Former Systems developer    Quit date: 05/16/1993  . Alcohol use No  . Drug use: No  . Sexual activity: Not on file   Other Topics Concern  .  Not on file   Social History Narrative  . No narrative on file   Past Surgical History:  Procedure Laterality Date  . PERIPHERAL VASCULAR CATHETERIZATION Right 02/07/2016   Procedure: Lower Extremity Angiography;  Surgeon: Algernon Huxley, MD;  Location: [place] [facility];  Service: Cardiovascular;  Laterality: Right;  . PERIPHERAL VASCULAR CATHETERIZATION Right 03/27/2016   Procedure: Lower Extremity Angiography;  Surgeon: Algernon Huxley, MD;  Location: [place] [facility];  Service: Cardiovascular;  Laterality: Right;  . PERIPHERAL VASCULAR CATHETERIZATION  03/27/2016   Procedure: Lower Extremity Intervention;  Surgeon: Algernon Huxley, MD;  Location: [place] [facility];  Service:  Cardiovascular;;  . PERIPHERAL VASCULAR CATHETERIZATION Right 03/28/2016   Procedure: Lower Extremity Angiography;  Surgeon: Algernon Huxley, MD;  Location: [place] [facility];  Service: Cardiovascular;  Laterality: Right;  . SHOULDER SURGERY  1974  . UPPER GI ENDOSCOPY     Family History  Problem Relation Age of Onset  . Heart failure Mother   . Stroke Father    Allergies  Allergen Reactions  . Keflex [Cephalexin] Other (See Comments)    Patient states this medication made his legs hurt really bad.      Assessment & Plan:  Patient last seen on 04/17/16 in follow up to a right lower extremity angiogram for PAD with ulceration and infection right lower extremity on 03/28/16. Patient states minimal healing to his right ankle wound. Patient has home nursing services visit at least three times a week for local wound care. The patient does wound care the rest of the time. He has also noticed increased right lower extremity pain "over the last few weeks". Denies fever, nausea or vomiting. The patient underwent an ABI which showed Right ABI: 0.52 and Left 0.97 (no previous for comparison). A right lower extremity arterial duplex is notable for an occluded SFA / popliteal stent no flow in the ATA and monophasic in the PTA.  1. 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 Recommend a right lower extremity angiogram in an intervention to revascularize the extremity. Procedure, risks and benefits explained to patient. All questions answered. Patient wishes to proceed.   2. Hyperlipidemia, unspecified hyperlipidemia type - Stable Encouraged good control as its slows the progression of atherosclerotic disease  3. Essential hypertension - Stable Encouraged good control as its slows the progression of atherosclerotic disease  Current Outpatient Prescriptions on File Prior to Visit  Medication Sig  Dispense Refill  . apixaban (ELIQUIS) 2.5 MG TABS tablet Take 1 tablet (2.5 mg total) by mouth 2 (two) times daily. 60 tablet   . aspirin EC 81 MG tablet Take 1 tablet (81 mg total) by mouth daily. 150 tablet 2  . atorvastatin (LIPITOR) 20 MG tablet Take 1 tablet (20 mg total) by mouth daily. 30 tablet 3  . Cholecalciferol 1000 units capsule Take 1,000 Units by mouth every morning.    . clopidogrel (PLAVIX) 75 MG tablet   10  . Cyanocobalamin (VITAMIN B-12) 2500 MCG SUBL Place 2,500 mcg under the tongue every morning.    . fexofenadine (ALLEGRA) 180 MG tablet Take 180 mg by mouth daily.    . fluticasone (VERAMYST) 27.5 MCG/SPRAY nasal spray Place 2 sprays into the nose daily.    . Fluticasone-Salmeterol (ADVAIR) 250-50 MCG/DOSE AEPB Inhale 1 puff into the lungs 2 (two) times daily.    . hydrochlorothiazide (MICROZIDE) 12.5 MG  capsule Take 12.5 mg by mouth every morning.     Marland Kitchen levothyroxine (SYNTHROID, LEVOTHROID) 75 MCG tablet Take 75 mcg by mouth daily before breakfast.    . losartan (COZAAR) 25 MG tablet Take 25 mg by mouth every morning.  12  . Multiple Vitamins-Minerals (PRESERVISION/LUTEIN) CAPS Take 1 capsule by mouth 2 (two) times daily.    Marland Kitchen omeprazole (PRILOSEC) 20 MG capsule Take 20 mg by mouth daily.    Marland Kitchen tiotropium (SPIRIVA HANDIHALER) 18 MCG inhalation capsule Place 18 mcg into inhaler and inhale daily.    . traMADol (ULTRAM) 50 MG tablet Take 1 tablet (50 mg total) by mouth every 6 (six) hours as needed. 40 tablet 0  . vitamin E 400 UNIT capsule Take 400 Units by mouth every morning.      No current facility-administered medications on file prior to visit.     There are no Patient Instructions on file for this visit. No Follow-up on file.   Kynadie Yaun A Starr Urias, PA-C

## 2016-05-24 ENCOUNTER — Encounter (INDEPENDENT_AMBULATORY_CARE_PROVIDER_SITE_OTHER): Payer: Medicare Other

## 2016-05-24 ENCOUNTER — Ambulatory Visit (INDEPENDENT_AMBULATORY_CARE_PROVIDER_SITE_OTHER): Payer: Medicare Other | Admitting: Vascular Surgery

## 2016-05-28 ENCOUNTER — Other Ambulatory Visit
Admission: RE | Admit: 2016-05-28 | Discharge: 2016-05-28 | Disposition: A | Discharge status: Home / Self Care | Payer: Medicare Other | Attending: Vascular Surgery | Admitting: Vascular Surgery

## 2016-05-28 NOTE — Progress Notes (Signed)

## 2016-05-30 ENCOUNTER — Other Ambulatory Visit (INDEPENDENT_AMBULATORY_CARE_PROVIDER_SITE_OTHER): Payer: Self-pay | Admitting: Vascular Surgery

## 2016-05-31 ENCOUNTER — Other Ambulatory Visit
Admission: RE | Admit: 2016-05-31 | Discharge: 2016-05-31 | Disposition: A | Discharge status: Home / Self Care | Payer: Medicare Other | Source: Ambulatory Visit | Attending: Vascular Surgery | Admitting: Vascular Surgery

## 2016-05-31 DIAGNOSIS — I739 Peripheral vascular disease, unspecified: Secondary | ICD-10-CM | POA: Diagnosis present

## 2016-05-31 LAB — CREATININE, SERUM
Creatinine, Ser: 1.66 mg/dL — ABNORMAL HIGH (ref 0.61–1.24)
GFR calc non Af Amer: 34 mL/min — ABNORMAL LOW (ref >60)
GFR, EST AFRICAN AMERICAN: 40 mL/min — AB (ref >60)

## 2016-05-31 LAB — BUN: BUN: 43 mg/dL — AB (ref 6–20)

## 2016-05-31 MED ORDER — CLINDAMYCIN PHOSPHATE 300 MG/50ML IV SOLN
300.0000 mg | Freq: Once | INTRAVENOUS | Status: AC
  Administered 2016-06-01: 300 mg via INTRAVENOUS

## 2016-06-01 ENCOUNTER — Encounter: Payer: Self-pay | Admitting: *Deleted

## 2016-06-01 ENCOUNTER — Encounter
Admission: AD | Disposition: A | Discharge status: Skilled Nursing Facility | Payer: Self-pay | Source: Ambulatory Visit | Attending: Vascular Surgery

## 2016-06-01 ENCOUNTER — Inpatient Hospital Stay
Admission: AD | Admit: 2016-06-01 | Discharge: 2016-06-06 | DRG: 271 | Disposition: A | Discharge status: Skilled Nursing Facility | Payer: Medicare Other | Source: Ambulatory Visit | Attending: Vascular Surgery | Admitting: Vascular Surgery

## 2016-06-01 DIAGNOSIS — Z823 Family history of stroke: Secondary | ICD-10-CM | POA: Diagnosis not present

## 2016-06-01 DIAGNOSIS — Z7902 Long term (current) use of antithrombotics/antiplatelets: Secondary | ICD-10-CM | POA: Diagnosis not present

## 2016-06-01 DIAGNOSIS — Z7982 Long term (current) use of aspirin: Secondary | ICD-10-CM | POA: Diagnosis not present

## 2016-06-01 DIAGNOSIS — I70233 Atherosclerosis of native arteries of right leg with ulceration of ankle: Secondary | ICD-10-CM | POA: Diagnosis present

## 2016-06-01 DIAGNOSIS — Z7901 Long term (current) use of anticoagulants: Secondary | ICD-10-CM | POA: Diagnosis not present

## 2016-06-01 DIAGNOSIS — I70238 Atherosclerosis of native arteries of right leg with ulceration of other part of lower right leg: Secondary | ICD-10-CM | POA: Diagnosis not present

## 2016-06-01 DIAGNOSIS — I739 Peripheral vascular disease, unspecified: Secondary | ICD-10-CM

## 2016-06-01 DIAGNOSIS — I129 Hypertensive chronic kidney disease with stage 1 through stage 4 chronic kidney disease, or unspecified chronic kidney disease: Secondary | ICD-10-CM | POA: Diagnosis present

## 2016-06-01 DIAGNOSIS — Z8249 Family history of ischemic heart disease and other diseases of the circulatory system: Secondary | ICD-10-CM | POA: Diagnosis not present

## 2016-06-01 DIAGNOSIS — M6281 Muscle weakness (generalized): Secondary | ICD-10-CM

## 2016-06-01 DIAGNOSIS — T82856A Stenosis of peripheral vascular stent, initial encounter: Secondary | ICD-10-CM | POA: Diagnosis present

## 2016-06-01 DIAGNOSIS — J45909 Unspecified asthma, uncomplicated: Secondary | ICD-10-CM | POA: Diagnosis present

## 2016-06-01 DIAGNOSIS — N189 Chronic kidney disease, unspecified: Secondary | ICD-10-CM | POA: Diagnosis present

## 2016-06-01 HISTORY — PX: PERIPHERAL VASCULAR CATHETERIZATION: SHX172C

## 2016-06-01 SURGERY — LOWER EXTREMITY ANGIOGRAPHY
Anesthesia: Moderate Sedation | Laterality: Right

## 2016-06-01 MED ORDER — HEPARIN SODIUM (PORCINE) 1000 UNIT/ML IJ SOLN
INTRAMUSCULAR | Status: AC
  Filled 2016-06-01: qty 1

## 2016-06-01 MED ORDER — SODIUM CHLORIDE 0.9 % IJ SOLN
INTRAMUSCULAR | Status: AC
  Filled 2016-06-01: qty 50

## 2016-06-01 MED ORDER — FLUTICASONE PROPIONATE 50 MCG/ACT NA SUSP
2.0000 | Freq: Every day | NASAL | Status: DC
  Administered 2016-06-01 – 2016-06-06 (×6): 2 via NASAL
  Filled 2016-06-01: qty 16

## 2016-06-01 MED ORDER — SODIUM CHLORIDE 0.9 % IV SOLN: INTRAVENOUS | Status: DC

## 2016-06-01 MED ORDER — FENTANYL CITRATE (PF) 100 MCG/2ML IJ SOLN
INTRAMUSCULAR | Status: DC | PRN
  Administered 2016-06-01: 25 ug via INTRAVENOUS
  Administered 2016-06-01 (×2): 50 ug via INTRAVENOUS
  Administered 2016-06-01: 25 ug via INTRAVENOUS
  Administered 2016-06-01: 50 ug via INTRAVENOUS

## 2016-06-01 MED ORDER — TRAMADOL HCL 50 MG PO TABS
50.0000 mg | ORAL_TABLET | Freq: Three times a day (TID) | ORAL | Status: DC | PRN
  Administered 2016-06-01 – 2016-06-04 (×3): 100 mg via ORAL
  Administered 2016-06-04: 50 mg via ORAL
  Administered 2016-06-05 – 2016-06-06 (×4): 100 mg via ORAL
  Filled 2016-06-01 (×2): qty 2
  Filled 2016-06-01 (×2): qty 1
  Filled 2016-06-01 (×5): qty 2

## 2016-06-01 MED ORDER — OCUVITE-LUTEIN PO CAPS
1.0000 | ORAL_CAPSULE | Freq: Two times a day (BID) | ORAL | Status: DC
  Administered 2016-06-01 – 2016-06-06 (×10): 1 via ORAL
  Filled 2016-06-01 (×10): qty 1

## 2016-06-01 MED ORDER — CLOPIDOGREL BISULFATE 75 MG PO TABS
ORAL_TABLET | ORAL | Status: AC
  Filled 2016-06-01: qty 1

## 2016-06-01 MED ORDER — ALTEPLASE 2 MG IJ SOLR
INTRAMUSCULAR | Status: AC
  Filled 2016-06-01: qty 4

## 2016-06-01 MED ORDER — LORATADINE 10 MG PO TABS
10.0000 mg | ORAL_TABLET | Freq: Every day | ORAL | Status: DC
  Administered 2016-06-01 – 2016-06-06 (×6): 10 mg via ORAL
  Filled 2016-06-01 (×8): qty 1

## 2016-06-01 MED ORDER — CLINDAMYCIN PHOSPHATE 300 MG/50ML IV SOLN
INTRAVENOUS | Status: AC
  Filled 2016-06-01: qty 50

## 2016-06-01 MED ORDER — VITAMIN B-12 1000 MCG PO TABS
2500.0000 ug | ORAL_TABLET | ORAL | Status: DC
  Administered 2016-06-01 – 2016-06-06 (×6): 2500 ug via ORAL
  Filled 2016-06-01: qty 3
  Filled 2016-06-01: qty 1
  Filled 2016-06-01: qty 3
  Filled 2016-06-01: qty 2
  Filled 2016-06-01 (×3): qty 3

## 2016-06-01 MED ORDER — METHYLPREDNISOLONE SODIUM SUCC 125 MG IJ SOLR: 125.0000 mg | INTRAMUSCULAR | Status: DC | PRN

## 2016-06-01 MED ORDER — HYDROMORPHONE HCL 1 MG/ML IJ SOLN: 1.0000 mg | Freq: Once | INTRAMUSCULAR | Status: DC

## 2016-06-01 MED ORDER — VITAMIN E 180 MG (400 UNIT) PO CAPS
400.0000 [IU] | ORAL_CAPSULE | ORAL | Status: DC
  Administered 2016-06-01 – 2016-06-06 (×6): 400 [IU] via ORAL
  Filled 2016-06-01 (×6): qty 1

## 2016-06-01 MED ORDER — LIDOCAINE HCL (PF) 1 % IJ SOLN
INTRAMUSCULAR | Status: AC
  Filled 2016-06-01: qty 30

## 2016-06-01 MED ORDER — IOPAMIDOL (ISOVUE-300) INJECTION 61%
INTRAVENOUS | Status: DC | PRN
  Administered 2016-06-01: 75 mL via INTRAVENOUS

## 2016-06-01 MED ORDER — ONDANSETRON HCL 4 MG/2ML IJ SOLN: 4.0000 mg | Freq: Four times a day (QID) | INTRAMUSCULAR | Status: DC | PRN

## 2016-06-01 MED ORDER — FAMOTIDINE 20 MG PO TABS: 40.0000 mg | ORAL_TABLET | ORAL | Status: DC | PRN

## 2016-06-01 MED ORDER — PANTOPRAZOLE SODIUM 40 MG PO TBEC
40.0000 mg | DELAYED_RELEASE_TABLET | Freq: Every day | ORAL | Status: DC
  Administered 2016-06-01 – 2016-06-06 (×6): 40 mg via ORAL
  Filled 2016-06-01 (×6): qty 1

## 2016-06-01 MED ORDER — VITAMIN D 1000 UNITS PO TABS
1000.0000 [IU] | ORAL_TABLET | ORAL | Status: DC
  Administered 2016-06-02 – 2016-06-06 (×5): 1000 [IU] via ORAL
  Filled 2016-06-01 (×5): qty 1

## 2016-06-01 MED ORDER — DIPHENHYDRAMINE HCL 50 MG/ML IJ SOLN
INTRAMUSCULAR | Status: AC
  Filled 2016-06-01: qty 1

## 2016-06-01 MED ORDER — FENTANYL CITRATE (PF) 100 MCG/2ML IJ SOLN
INTRAMUSCULAR | Status: AC
  Filled 2016-06-01: qty 2

## 2016-06-01 MED ORDER — MORPHINE SULFATE (PF) 4 MG/ML IV SOLN
1.0000 mg | INTRAVENOUS | Status: DC | PRN
  Administered 2016-06-01 – 2016-06-03 (×5): 2 mg via INTRAVENOUS
  Administered 2016-06-04: 1 mg via INTRAVENOUS
  Administered 2016-06-06: 2 mg via INTRAVENOUS
  Filled 2016-06-01 (×7): qty 1

## 2016-06-01 MED ORDER — LOSARTAN POTASSIUM 25 MG PO TABS
25.0000 mg | ORAL_TABLET | ORAL | Status: DC
  Administered 2016-06-01 – 2016-06-06 (×6): 25 mg via ORAL
  Filled 2016-06-01 (×6): qty 1

## 2016-06-01 MED ORDER — HYDROCHLOROTHIAZIDE 12.5 MG PO CAPS
12.5000 mg | ORAL_CAPSULE | ORAL | Status: DC
  Administered 2016-06-01 – 2016-06-06 (×6): 12.5 mg via ORAL
  Filled 2016-06-01 (×6): qty 1

## 2016-06-01 MED ORDER — ASPIRIN EC 81 MG PO TBEC
81.0000 mg | DELAYED_RELEASE_TABLET | Freq: Every day | ORAL | Status: DC
  Administered 2016-06-02 – 2016-06-06 (×5): 81 mg via ORAL
  Filled 2016-06-01 (×5): qty 1

## 2016-06-01 MED ORDER — HEPARIN SODIUM (PORCINE) 1000 UNIT/ML IJ SOLN
INTRAMUSCULAR | Status: DC | PRN
  Administered 2016-06-01: 4000 [IU] via INTRAVENOUS

## 2016-06-01 MED ORDER — FLUTICASONE FUROATE 27.5 MCG/SPRAY NA SUSP: 2.0000 | Freq: Every day | NASAL | Status: DC

## 2016-06-01 MED ORDER — TIOTROPIUM BROMIDE MONOHYDRATE 18 MCG IN CAPS
18.0000 ug | ORAL_CAPSULE | Freq: Every day | RESPIRATORY_TRACT | Status: DC
  Administered 2016-06-01 – 2016-06-06 (×6): 18 ug via RESPIRATORY_TRACT
  Filled 2016-06-01 (×2): qty 5

## 2016-06-01 MED ORDER — ATORVASTATIN CALCIUM 20 MG PO TABS
20.0000 mg | ORAL_TABLET | Freq: Every evening | ORAL | Status: DC
  Administered 2016-06-01 – 2016-06-05 (×5): 20 mg via ORAL
  Filled 2016-06-01 (×5): qty 1

## 2016-06-01 MED ORDER — TIROFIBAN (AGGRASTAT) BOLUS VIA INFUSION
25.0000 ug/kg | Freq: Once | INTRAVENOUS | Status: AC
  Administered 2016-06-01: 12500 ug via INTRAVENOUS

## 2016-06-01 MED ORDER — SODIUM CHLORIDE 0.9 % IV SOLN
INTRAVENOUS | Status: DC
  Administered 2016-06-01 – 2016-06-03 (×5): via INTRAVENOUS

## 2016-06-01 MED ORDER — TIROFIBAN HCL IV 12.5 MG/250 ML
INTRAVENOUS | Status: AC
  Administered 2016-06-01: 12500 ug via INTRAVENOUS
  Filled 2016-06-01: qty 250

## 2016-06-01 MED ORDER — TIROFIBAN HCL IN NACL 5-0.9 MG/100ML-% IV SOLN
0.0750 ug/kg/min | INTRAVENOUS | Status: DC
  Administered 2016-06-01 (×2): 0.075 ug/kg/min via INTRAVENOUS
  Filled 2016-06-01 (×2): qty 100

## 2016-06-01 MED ORDER — MIDAZOLAM HCL 5 MG/5ML IJ SOLN
INTRAMUSCULAR | Status: AC
  Filled 2016-06-01: qty 5

## 2016-06-01 MED ORDER — DIPHENHYDRAMINE HCL 50 MG/ML IJ SOLN
INTRAMUSCULAR | Status: DC | PRN
  Administered 2016-06-01: 50 mg via INTRAVENOUS

## 2016-06-01 MED ORDER — HEPARIN (PORCINE) IN NACL 2-0.9 UNIT/ML-% IJ SOLN
INTRAMUSCULAR | Status: AC
  Filled 2016-06-01: qty 1000

## 2016-06-01 MED ORDER — MIDAZOLAM HCL 2 MG/2ML IJ SOLN
INTRAMUSCULAR | Status: DC | PRN
  Administered 2016-06-01: 0.5 mg via INTRAVENOUS
  Administered 2016-06-01: 1 mg via INTRAVENOUS
  Administered 2016-06-01: 0.5 mg via INTRAVENOUS
  Administered 2016-06-01: 1 mg via INTRAVENOUS

## 2016-06-01 MED ORDER — MOMETASONE FURO-FORMOTEROL FUM 200-5 MCG/ACT IN AERO
2.0000 | INHALATION_SPRAY | Freq: Two times a day (BID) | RESPIRATORY_TRACT | Status: DC
  Administered 2016-06-01 – 2016-06-06 (×9): 2 via RESPIRATORY_TRACT
  Filled 2016-06-01: qty 8.8

## 2016-06-01 MED ORDER — NITROGLYCERIN 5 MG/ML IV SOLN
INTRAVENOUS | Status: AC
  Filled 2016-06-01: qty 10

## 2016-06-01 MED ORDER — CLOPIDOGREL BISULFATE 75 MG PO TABS
75.0000 mg | ORAL_TABLET | Freq: Every day | ORAL | Status: DC
  Administered 2016-06-01 – 2016-06-06 (×6): 75 mg via ORAL
  Filled 2016-06-01 (×5): qty 1

## 2016-06-01 MED ORDER — LEVOTHYROXINE SODIUM 75 MCG PO TABS
75.0000 ug | ORAL_TABLET | Freq: Every day | ORAL | Status: DC
  Administered 2016-06-02 – 2016-06-06 (×5): 75 ug via ORAL
  Filled 2016-06-01 (×5): qty 1

## 2016-06-01 SURGICAL SUPPLY — 30 items
BALLN LUTONIX 4X150X130 (BALLOONS) ×4
BALLN LUTONIX 5X150X130 (BALLOONS) ×4
BALLN LUTONIX 6X150X130 (BALLOONS) ×4
BALLN ULTRVRSE 3X220X150 (BALLOONS) ×4
BALLN ULTRVRSE 3X300X130 (BALLOONS) ×4
BALLN ULTRVRSE 3X300X150 (BALLOONS) ×2
BALLN ULTRVRSE 3X300X150 OTW (BALLOONS) ×2
BALLN ULTRVRSE 6X150X130C (BALLOONS) ×4
BALLOON LUTONIX 4X150X130 (BALLOONS) ×2 IMPLANT
BALLOON LUTONIX 5X150X130 (BALLOONS) ×2 IMPLANT
BALLOON LUTONIX 6X150X130 (BALLOONS) ×2 IMPLANT
BALLOON ULTRVRSE 3X220X150 (BALLOONS) ×2 IMPLANT
BALLOON ULTRVRSE 3X300X130 (BALLOONS) ×2 IMPLANT
BALLOON ULTRVRSE 3X300X150 OTW (BALLOONS) ×2 IMPLANT
BALLOON ULTRVRSE 6X150X130C (BALLOONS) ×2 IMPLANT
CATH PIG 70CM (CATHETERS) ×4 IMPLANT
CATH VERT 100CM (CATHETERS) ×4 IMPLANT
DEVICE PRESTO INFLATION (MISCELLANEOUS) ×8 IMPLANT
DEVICE SOLENT OMNI 120CM (CATHETERS) ×4 IMPLANT
DEVICE STARCLOSE SE CLOSURE (Vascular Products) ×4 IMPLANT
GLIDEWIRE ADV .035X260CM (WIRE) ×4 IMPLANT
PACK ANGIOGRAPHY (CUSTOM PROCEDURE TRAY) ×4 IMPLANT
SHEATH ANL2 6FRX45 HC (SHEATH) ×4 IMPLANT
SHEATH BRITE TIP 5FRX11 (SHEATH) ×4 IMPLANT
STENT TIGRIS 6X100X120 (Permanent Stent) ×4 IMPLANT
STENT VIABAHN 6X150X120 (Permanent Stent) ×4 IMPLANT
SYR MEDRAD MARK V 150ML (SYRINGE) ×4 IMPLANT
TUBING CONTRAST HIGH PRESS 72 (TUBING) ×4 IMPLANT
WIRE G V18X300CM (WIRE) ×8 IMPLANT
WIRE J 3MM .035X145CM (WIRE) ×4 IMPLANT

## 2016-06-01 NOTE — H&P (Signed)
[facility] History & Physical Update  The patient was interviewed and re-examined.  The patient's previous History and Physical has been reviewed and is unchanged.  There is no change in the plan of care. We plan to proceed with the scheduled procedure.  Leotis Pain, MD  06/01/2016, 8:16 AM

## 2016-06-01 NOTE — Progress Notes (Addendum)
Spoke with Ivin Booty, RN in [place] about Aggrostat gtt/ rate ordered at 7.4 ml/hr...but pt brought to the floor with rate of 2.76mls/hr/ she stated that 2.29ml/hr is a renal dose and that was correct rate to run/ asked if she would change order to 2.18mls ..states that she would try to fix it.   Ivin Booty called back to 2A to report that aggrostat gtt should be infusing at 7.22mls/hr. Called Pharmacy/ spoke with Jason/ instructed that gtt should be infusing at 7.32mls/hr.   Pt also sent to floor with 90mls of air in PAD- instructed that that could stay in place with 72mls of air for 24hrs... Will attempt to deflate slowly/ will monitor close.

## 2016-06-01 NOTE — Progress Notes (Signed)
Pt here today for lower leg angiogram, will monitor vitals

## 2016-06-01 NOTE — Op Note (Signed)
[facility] Percutaneous Study/Intervention Procedural Note   Date of Surgery: 06/01/2016  Surgeon(s):DEW,JASON   Assistants:none  Pre-operative Diagnosis: PAD with ulceration right lower extremity.  Post-operative diagnosis: Same  Procedure(s) Performed: 1. Ultrasound guidance for vascular access left femoral artery 2. Catheter placement into peroneal artery from right femoral approach 3. Aortogram and selective right lower extremity angiogram 4. Percutaneous transluminal angioplasty of the peroneal artery and the tibioperoneal trunk with 3 mm diameter angioplasty balloon and 4 mm diameter Lutonix drug-coated balloon in the tibioperoneal trunk 5. Percutaneous transluminal angioplasty of the popliteal artery with 5 mm diameter Lutonix drug-coated balloon  6.  Percutaneous transluminal angioplasty of the mid to distal superficial femoral artery with 6 mm diameter Lutonix drug-coated balloon 7. Catheter directed thrombolytic therapy to the right SFA, popliteal artery, tibioperoneal trunk, and peroneal arteries for thrombosis before and after above procedures.  8.  Mechanical rheolytic thrombectomy with the AngioJet Omni catheter to the right SFA, popliteal artery, tibioperoneal trunk, and peroneal arteries  9.  Tigris stent placement to the right popliteal artery for residual stenosis and thrombus after above procedures using a 6 mm diameter by 10 cm length stent  10. Viabahn covered stent placement to the mid to distal superficial femoral artery for residual thrombus and stenosis after above procedures with a 6 mm diameter by 15 cm length stent  11. StarClose closure device left femoral artery  EBL: Minimal  Contrast: 75 cc  Fluoro Time: 15.4 minutes  Moderate Conscious Sedation Time: approximately 75 minutes using 3 mg of Versed and 200 mcg of  Fentanyl  Indications: Patient is a 82 y.o.male with nonhealing ulceration and rest pain of the right foot. The patient has noninvasive study showing occlusion of his previous interventions with a markedly reduced ABI on the right. The patient is brought in for angiography for further evaluation and potential treatment. Risks and benefits are discussed and informed consent is obtained  Procedure: The patient was identified and appropriate procedural time out was performed. The patient was then placed supine on the table and prepped and draped in the usual sterile fashion.Moderate conscious sedation was administered during a face to face encounter with the patient throughout the procedure with my supervision of the RN administering medicines and monitoring the patient's vital signs, pulse oximetry, telemetry and mental status throughout from the start of the procedure until the patient was taken to the recovery room. Ultrasound was used to evaluate the left common femoral artery. It was patent . A digital ultrasound image was acquired. A Seldinger needle was used to access the left common femoral artery under direct ultrasound guidance and a permanent image was performed. A 0.035 J wire was advanced without resistance and a 5Fr sheath was placed. Pigtail catheter was placed into the aorta and an AP aortogram was performed. This demonstrated normal renal arteries and normal aorta and iliac segments without significant stenosis. I then crossed the aortic bifurcation and advanced to the right femoral head. Selective right lower extremity angiogram was then performed. This demonstrated a normal common femoral artery and profunda femoris artery. There was a occlusion of the superficial femoral artery just above the previously placed stent with the only reconstitution distally seen being a small peroneal artery that reconstituted in its midsegment. Imaging was a very low quality due to the patient  continuously moving during the procedure and throughout the procedure. The patient was systemically heparinized and a 6 [place] Ansell sheath was then placed over the [employer] wire.  I then used a Kumpe catheter and the advantage wire to cross the occlusion and confirm intraluminal flow in the foot. Crossing the occlusion was quite easy consistent with thrombosis. I then started by performing angioplasty and used a 3 mm diameter by 30 cm length angioplasty balloon in the peroneal artery from the mid to distal segment up through the tibioperoneal trunk. This was inflated to 12 atm for 1 minute. I then used a 5 mm diameter by 15 cm length Lutonix drug-coated balloon from the below-knee popliteal artery up to the proximal popliteal artery. This was inflated to 10 atm for 1 minute. For the SFA within and just above the previously placed stent a 6 mm diameter by 15 cm length Lutonix drug-coated angioplasty balloon was inflated to 10 atm for 1 minute. Imaging following these angioplasty showed significant residual thrombus within the stent particularly at the proximal and distal ends as well as in the mid popliteal artery. There was still very poor flow distally with thrombus down into the tibioperoneal trunk and peroneal arteries. I then used 4 mg of TPA and a power pulse spray fashion treating from the mid superficial femoral artery down through the popliteal artery, tibioperoneal trunk, and into the peroneal artery. I used only 4 mg of TPA given the patient's advanced age and previous anticoagulated status. It was very disheartening that he had developed thrombosis of his stents on anticoagulation. After this 12 for approximately 10 minutes mechanical rheolytic thrombectomy was performed through the same vessels including the SFA, popliteal artery, tibioperoneal trunk, and peroneal arteries. Approximately 117 cc of effluent was returned. Following this, there was still very poor flow with extensive thrombosis and  sluggish flow distally. I exchanged for a 0.018 wire and placed a Viabahn covered stent throughout much of the SFA up within and about 4-5 cm above the previously placed stent to cover the proximal thrombosis which was extensive. This was a 6 mm diameter by 15 cm length stent postdilated with a 6 mm balloon. For the area popliteal artery a 6 mm diameter by 10 cm length Tigris stent was deployed and postdilated with a 6 mm balloon. Slow was still very sluggish distally and I then performed angioplasty again of the entire peroneal artery down to the distal segment with 3 mm diameter by 30 cm length angioplasty balloon and of the tibioperoneal trunk with a 4 mm diameter by 15 cm length Lutonix drug-coated angioplasty balloon. At this point, very sluggish flow was seen through a peroneal artery with residual thrombus seen. I did not feel there was much more to do at this point other than to try anticoagulants to treat his runoff vessels. His SFA and popliteal arteries now had no significant stenosis. I elected to terminate the procedure. The sheath was removed and StarClose closure device was deployed in the left femoral artery with excellent hemostatic result. The patient was taken to the recovery room in stable condition having tolerated the procedure well.  Findings:  Aortogram: normal renal arteries, normal aorta, tortuous but not stenotic iliac arteries Right Lower Extremity: Normal common femoral artery and profunda femoris artery. There was a occlusion of the superficial femoral artery just above the previously placed stent with the only reconstitution distally seen being a small peroneal artery that reconstituted in its midsegment. Imaging was a very low quality due to the patient continuously moving during the procedure and throughout the procedure.   Disposition: Patient was taken to the recovery room in stable condition having tolerated the  procedure well.  Complications:  None  Leotis Pain 06/01/2016 10:46 AM   This note was created with Dragon Medical transcription system. Any errors in dictation are purely unintentional.

## 2016-06-02 ENCOUNTER — Encounter: Payer: Self-pay | Admitting: Vascular Surgery

## 2016-06-02 DIAGNOSIS — I70238 Atherosclerosis of native arteries of right leg with ulceration of other part of lower right leg: Secondary | ICD-10-CM

## 2016-06-02 MED ORDER — APIXABAN 2.5 MG PO TABS
2.5000 mg | ORAL_TABLET | Freq: Two times a day (BID) | ORAL | Status: DC
  Administered 2016-06-02 – 2016-06-06 (×8): 2.5 mg via ORAL
  Filled 2016-06-02 (×8): qty 1

## 2016-06-02 NOTE — Progress Notes (Signed)
 Vein and Vascular Surgery  Daily Progress Note   Subjective  - 1 Day Post-Op  Patient having less pain, but has required morphine three times Foot stable.  A little warmer today. Doppler signals present distally, but no palpable pulses Difficult getting out of bed today  Objective Vitals:   06/01/16 1950 06/02/16 0401 06/02/16 0745 06/02/16 1203  BP: 127/64 (!) 111/59 (!) 132/59 (!) 114/59  Pulse: (!) 101 (!) 101 97 99  Resp: 19 17 20 18   Temp:  97.5 F (36.4 C)  98.4 F (36.9 C)  TempSrc:  Oral  Oral  SpO2: 96% 96% 93% 99%  Weight:      Height:        Intake/Output Summary (Last 24 hours) at 06/02/16 1636 Last data filed at 06/02/16 1024  Gross per 24 hour  Intake           2041.4 ml  Output              100 ml  Net           1941.4 ml    PULM  CTAB CV  RRR VASC  Foot as above.  Access site C/D/I  Laboratory CBC    Component Value Date/Time   WBC 10.6 03/30/2016 0500   HGB 10.7 (L) 03/30/2016 0500   HCT 30.3 (L) 03/30/2016 0500   PLT 502 (H) 03/30/2016 0500    BMET    Component Value Date/Time   NA 136 03/31/2016 0419   K 3.8 03/31/2016 0419   CL 109 03/31/2016 0419   CO2 22 03/31/2016 0419   GLUCOSE 111 (H) 03/31/2016 0419   BUN 43 (H) 05/31/2016 0922   CREATININE 1.66 (H) 05/31/2016 0922   CALCIUM 8.3 (L) 03/31/2016 0419   GFRNONAA 34 (L) 05/31/2016 0922   GFRAA 40 (L) 05/31/2016 [id number]    Assessment/Planning: POD #1 s/p extensive RLE revascularization   Limited perfusion, and very little other options exist at this point  Will anticoagulate and try to maintain his limited perfusion  aggrastat overnight now completed, will switch to Eliquis  PT consult for ambulatory dysfunction and home safety eval.  Expect he will need placement  High risk of amputation still present, but not needed urgently  Patient has given me permission to speak with son who has been estranged but contacted office today.  Will call him this  evening      Leotis Pain  06/02/2016, 4:36 PM

## 2016-06-02 NOTE — Care Management (Signed)
Underwent angiogram of right lower extremity for PAD and ulceration . spoke with vascular and anticipate patient is going to require skilled nursing facility placement.  Obtained order for PT and OT. Updated CSW of possible snf need.

## 2016-06-03 DIAGNOSIS — I70238 Atherosclerosis of native arteries of right leg with ulceration of other part of lower right leg: Secondary | ICD-10-CM

## 2016-06-03 LAB — CBC
HCT: 37.3 % — ABNORMAL LOW (ref 40.0–52.0)
Hemoglobin: 12.6 g/dL — ABNORMAL LOW (ref 13.0–18.0)
MCH: 32.1 pg (ref 26.0–34.0)
MCHC: 33.8 g/dL (ref 32.0–36.0)
MCV: 95 fL (ref 80.0–100.0)
Platelets: 858 10*3/uL — ABNORMAL HIGH (ref 150–440)
RBC: 3.93 MIL/uL — ABNORMAL LOW (ref 4.40–5.90)
RDW: 15.2 % — ABNORMAL HIGH (ref 11.5–14.5)
WBC: 13.8 10*3/uL — ABNORMAL HIGH (ref 3.8–10.6)

## 2016-06-03 LAB — BASIC METABOLIC PANEL
Anion gap: 7 (ref 5–15)
BUN: 22 mg/dL — ABNORMAL HIGH (ref 6–20)
CO2: 20 mmol/L — ABNORMAL LOW (ref 22–32)
Calcium: 8.5 mg/dL — ABNORMAL LOW (ref 8.9–10.3)
Chloride: 113 mmol/L — ABNORMAL HIGH (ref 101–111)
Creatinine, Ser: 1.15 mg/dL (ref 0.61–1.24)
GFR calc Af Amer: >60 mL/min (ref >60)
GFR calc non Af Amer: 54 mL/min — ABNORMAL LOW (ref >60)
Glucose, Bld: 123 mg/dL — ABNORMAL HIGH (ref 65–99)
Potassium: 3.7 mmol/L (ref 3.5–5.1)
Sodium: 140 mmol/L (ref 135–145)

## 2016-06-03 NOTE — Evaluation (Signed)
Physical Therapy Evaluation Patient Details Name: NIRAJ ORTLIP MRN: [id number] DOB: Feb 03, 1923 Today's Date: 06/03/2016   History of Present Illness  82 yo male with onset of RLE revascularization procedure for PVD, poor outcome,  previously Non ambulatory, PVC's this afternoon.  PMHx: PVD,     Clinical Impression  Pt is up to chair today with a very limited tolerance for activity, but has been non ambulatory for the last few months.  He is appropriate for ongoing therapy but will expect the time to get back to really walking will be several weeks given his outcome from the surgery this week.  Follow acutely for standing transfers and strengthening as tolerated BLE's    Follow Up Recommendations SNF    Equipment Recommendations  None recommended by PT    Recommendations for Other Services       Precautions / Restrictions Precautions Precautions: Fall (telemetry) Restrictions Weight Bearing Restrictions: No      Mobility  Bed Mobility Overal bed mobility: Needs Assistance Bed Mobility: Supine to Sit     Supine to sit: Mod assist     General bed mobility comments: assisted to bedside then to chair   Transfers Overall transfer level: Needs assistance Equipment used: Rolling walker (2 wheeled);1 person hand held assist Transfers: Sit to/from [facility] Sit to Stand: Mod assist Stand pivot transfers: Mod assist       General transfer comment: used drop arm chair to get up and slid into chair  Ambulation/Gait             General Gait Details: not ambulatory  Stairs            Wheelchair Mobility    Modified Rankin (Stroke Patients Only)       Balance Overall balance assessment: Needs assistance Sitting-balance support: Feet supported Sitting balance-Leahy Scale: Fair     Standing balance support: Bilateral upper extremity supported Standing balance-Leahy Scale: Poor                               Pertinent  Vitals/Pain Pain Assessment: No/denies pain    Home Living Family/patient expects to be discharged to:: Skilled nursing facility                      Prior Function Level of Independence: Needs assistance   Gait / Transfers Assistance Needed: pt assisted to transfer at the SNF and has not walked since admission  ADL's / Homemaking Assistance Needed: SNF care for cooking and his self care        Hand Dominance        Extremity/Trunk Assessment   Upper Extremity Assessment: Generalized weakness           Lower Extremity Assessment: Generalized weakness      Cervical / Trunk Assessment: Kyphotic  Communication   Communication: No difficulties  Cognition Arousal/Alertness: Lethargic Behavior During Therapy: Flat affect Overall Cognitive Status: Difficult to assess                      General Comments General comments (skin integrity, edema, etc.): Pt has been a hoyer transfer at SNF and is recommended to return as he is dependent for all mobility on some amount of assistance    Exercises     Assessment/Plan    PT Assessment Patient needs continued PT services  PT Problem List Decreased strength;Decreased range of motion;Decreased activity  tolerance;Decreased balance;Decreased mobility;Decreased coordination;Decreased cognition;Decreased knowledge of use of DME;Decreased safety awareness;Decreased knowledge of precautions;Cardiopulmonary status limiting activity;Decreased skin integrity          PT Treatment Interventions DME instruction;Gait training;Functional mobility training;Therapeutic activities;Therapeutic exercise;Balance training;Neuromuscular re-education;Cognitive remediation;Patient/family education    PT Goals (Current goals can be found in the Care Plan section)  Acute Rehab PT Goals Patient Stated Goal: to get cleaned up and to get stronger PT Goal Formulation: With patient Time For Goal Achievement: 06/17/16 Potential to  Achieve Goals: Good    Frequency Min 2X/week   Barriers to discharge  (staying in SNF)      Co-evaluation               End of Session Equipment Utilized During Treatment: Gait belt;Oxygen Activity Tolerance: Patient tolerated treatment well;Patient limited by fatigue Patient left: in chair;with call bell/phone within reach;with family/visitor present;with nursing/sitter in room Nurse Communication: Mobility status;Other (comment) (needs to be bathed, nursing arrived to help)         Time: [id number] PT Time Calculation (min) (ACUTE ONLY): 32 min   Charges:   PT Evaluation $PT Eval Moderate Complexity: 1 Procedure PT Treatments $Therapeutic Activity: 8-22 mins   PT G Codes:        Ramond Dial 2016/06/17, 12:55 PM    Mee Hives, PT MS Acute Rehab Dept. Number: [place]

## 2016-06-03 NOTE — Progress Notes (Addendum)
[facility]  Daily Progress Note   Subjective: 2 Days Post-Op: Ultrasound guidance for vascular access left femoral artery, Catheter placement into peroneal artery from right femoral approach, Aortogram and selective right lower extremity angiogram, Percutaneous transluminal angioplasty of the peroneal artery and the tibioperoneal trunk with 3 mm diameter angioplasty balloon and 4 mm diameter Lutonix drug-coated balloon in the tibioperoneal trunk, Percutaneous transluminal angioplasty of the popliteal artery with 5 mm diameter Lutonix drug-coated balloon, Percutaneous transluminal angioplasty of the mid to distal superficial femoral artery with 6 mm diameter Lutonix drug-coated balloon, Catheter directed thrombolytic therapy to the right SFA, popliteal artery, tibioperoneal trunk, and peroneal arteries for thrombosis before and after above procedures. Mechanical rheolytic thrombectomy with the AngioJet Omni catheter to the right SFA, popliteal artery, tibioperoneal trunk, and peroneal arteries, Tigris stent placement to the right popliteal artery for residual stenosis and thrombus after above procedures using a 6 mm diameter by 10 cm length stent, Viabahn covered stent placement to the mid to distal superficial femoral artery for residual thrombus and stenosis after above procedures with a 6 mm diameter by 15 cm length stent with StarClose closure device left femoral artery.  Patient lethargic but easily arousable today. Seems to be resting comfortably. Seen by cards for frequent PVC on telemetry. Seen by both physical therapy and occupation therapy both recommended SNF placement. Patient is open to discharge to SNF.   Objective: Vitals:   06/03/16 0809 06/03/16 0817 06/03/16 1159 06/03/16 1200  BP: (!) 151/74 (!) 147/87 (!) 156/52   Pulse: (!) 105 (!) 107 (!) 37 (!) 109  Resp: 20     Temp: 98.1 F (36.7 C)  98.2 F (36.8 C)   TempSrc: Oral  Oral   SpO2: 93%  97% 95%   Weight:      Height:        Intake/Output Summary (Last 24 hours) at 06/03/16 1512 Last data filed at 06/03/16 0951  Gross per 24 hour  Intake              240 ml  Output                0 ml  Net              240 ml   Physical Exam: Lethargic, NAD CV: RRR Pulmonary: CTA Bilaterally Abdomen: Soft, Nontender, Nondistended Groin: No swelling or drainage noted. Right Lower Extremity: Wound stable. Foot Stable. No pedal pulses present. Cooler.    Laboratory: CBC    Component Value Date/Time   WBC 13.8 (H) 06/03/2016 0511   HGB 12.6 (L) 06/03/2016 0511   HCT 37.3 (L) 06/03/2016 0511   PLT 858 (H) 06/03/2016 0511   BMET    Component Value Date/Time   NA 140 06/03/2016 0511   K 3.7 06/03/2016 0511   CL 113 (H) 06/03/2016 0511   CO2 20 (L) 06/03/2016 0511   GLUCOSE 123 (H) 06/03/2016 0511   BUN 22 (H) 06/03/2016 0511   CREATININE 1.15 06/03/2016 0511   CALCIUM 8.5 (L) 06/03/2016 0511   GFRNONAA 54 (L) 06/03/2016 0511   GFRAA >60 06/03/2016 0511   Assessment/Planning: 82 year old male with severe RLE PAD and ankle wound s/p RLE angiogram with poor results will most likely need amputation in future. 1) Will plan on dispo to SNF 2) Increased WBC - will order CBC in AM possibly start ABX 3) Awaiting local wound recommendations from wound care 4) Continue PT / OT while  in house 5) Pain Control 6) EKG and Echo for Cards to complete consultation.   Marcelle Overlie PA-C 06/03/2016 3:12 PM

## 2016-06-03 NOTE — Evaluation (Signed)
Occupational Therapy Evaluation Patient Details Name: Andrew [name] MRN: [id number] DOB: 1923-02-09 Today's Date: 06/03/2016    History of Present Illness 82 yo male with onset of RLE revascularization procedure for PVD, poor outcome, previously Non ambulatory, PVC's this afternoon.  PMHx: PVD, CKD, HTN, asthma   Clinical Impression   Pt presents up in chair today with poor activity tolerance, decreased strength, and lethargic. PT reports pt has been non-ambulatory for past few months. Pt will benefit from skilled OT services to address noted impairments and further assess functional deficits when pt is more alert. Recommend SNF placement for rehabilitation prior to return home. Barriers to return home, as pt lives alone and reports no support from family/friends available.     Follow Up Recommendations  SNF    Equipment Recommendations  None recommended by OT;Other (comment) (further assess at SNF pending pt progress)    Recommendations for Other Services       Precautions / Restrictions Precautions Precautions: Fall Restrictions Weight Bearing Restrictions: No      Mobility Bed Mobility Overal bed mobility: Needs Assistance Bed Mobility: Supine to Sit     Supine to sit: Mod assist     General bed mobility comments: assisted to bedside then to chair   Transfers Overall transfer level: Needs assistance Equipment used: Rolling walker (2 wheeled);1 person hand held assist Transfers: Sit to/from [facility] Sit to Stand: Mod assist Stand pivot transfers: Mod assist       General transfer comment: used drop arm chair to get up and slid into chair    Balance Overall balance assessment: Needs assistance Sitting-balance support: Feet supported Sitting balance-Leahy Scale: Fair     Standing balance support: Bilateral upper extremity supported Standing balance-Leahy Scale: Poor                              ADL Overall ADL's :  Needs assistance/impaired                                       General ADL Comments: unable to determine baseline at this time; difficult to assess current status due to arousal levels and pt reported fatigue from sitting up in the chair for past 45 minutes. Continue to assess as pt progresses     Vision Vision Assessment?: No apparent visual deficits   Perception     Praxis      Pertinent Vitals/Pain Pain Assessment: No/denies pain     Hand Dominance Right   Extremity/Trunk Assessment Upper Extremity Assessment Upper Extremity Assessment: Generalized weakness (BUE 4-/5, approx 75% shoulder flexion AROM, full AAROM shoulder flexion)   Lower Extremity Assessment Lower Extremity Assessment: Defer to PT evaluation   Cervical / Trunk Assessment Cervical / Trunk Assessment: Kyphotic   Communication Communication Communication: HOH   Cognition Arousal/Alertness: Lethargic Behavior During Therapy: Flat affect Overall Cognitive Status: Difficult to assess (oriented to place, situation, self, but not date)                     General Comments       Exercises       Shoulder Instructions      Home Living Family/patient expects to be discharged to:: Skilled nursing facility Living Arrangements: Alone  Prior Functioning/Environment Level of Independence: Needs assistance  Gait / Transfers Assistance Needed: pt assisted to transfer at the SNF and has not walked since admission ADL's / Homemaking Assistance Needed: SNF care for cooking and his self care            OT Problem List: Decreased strength;Decreased activity tolerance;Decreased knowledge of use of DME or AE;Decreased safety awareness   OT Treatment/Interventions: Self-care/ADL training;Therapeutic exercise;Therapeutic activities;Energy conservation;DME and/or AE instruction;Patient/family education    OT Goals(Current goals can be  found in the care plan section) Acute Rehab OT Goals Patient Stated Goal: get stronger OT Goal Formulation: With patient Time For Goal Achievement: 06/17/16 Potential to Achieve Goals: Good  OT Frequency: Min 1X/week   Barriers to D/C: Other (comment)  pt reports living alone in 1 level home and does not identify any family/friends who would be able to provide help at discharge       Co-evaluation              End of Session Nurse Communication: Other (comment) (pt requested to return to bed)  Activity Tolerance: Patient limited by lethargy;Patient limited by fatigue Patient left: in chair;with call bell/phone within reach;with nursing/sitter in room   Time: 1143-1157 OT Time Calculation (min): 14 min Charges:  OT General Charges $OT Visit: 1 Procedure OT Evaluation $OT Eval Moderate Complexity: 1 Procedure G-Codes:    Corky Sox, OTR/L 06/03/2016, 1:15 PM

## 2016-06-03 NOTE — Progress Notes (Signed)
Andrew [name] is a 82 y.o. male  [id number]  Primary Cardiologist: Neoma Laming Reason for Consultation: PVCs  HPI: 82 year old male who presented to the hospital for lower extremity angiogram. Patient had frequent PVCs thus I was asked to evaluate the patient.   Review of Systems: Unable to obtain   Past Medical History:  Diagnosis Date  . Asthma   . Chronic kidney disease   . Hypertension   . Peripheral vascular disease ([place])     Medications Prior to Admission  Medication Sig Dispense Refill  . apixaban (ELIQUIS) 2.5 MG TABS tablet Take 1 tablet (2.5 mg total) by mouth 2 (two) times daily. (Patient taking differently: Take 2.5 mg by mouth daily. ) 60 tablet   . aspirin EC 81 MG tablet Take 1 tablet (81 mg total) by mouth daily. 150 tablet 2  . atorvastatin (LIPITOR) 20 MG tablet Take 1 tablet (20 mg total) by mouth daily. (Patient taking differently: Take 20 mg by mouth every evening. ) 30 tablet 3  . Cholecalciferol 1000 units capsule Take 1,000 Units by mouth every morning.    . clopidogrel (PLAVIX) 75 MG tablet Take 75 mg by mouth daily.   10  . Cyanocobalamin (VITAMIN B-12) 2500 MCG SUBL Place 2,500 mcg under the tongue every morning.    . fexofenadine (ALLEGRA) 180 MG tablet Take 180 mg by mouth daily.    . fluticasone (VERAMYST) 27.5 MCG/SPRAY nasal spray Place 2 sprays into the nose daily.    . Fluticasone-Salmeterol (ADVAIR) 250-50 MCG/DOSE AEPB Inhale 1 puff into the lungs 2 (two) times daily.    . hydrochlorothiazide (MICROZIDE) 12.5 MG capsule Take 12.5 mg by mouth every morning.     Marland Kitchen levothyroxine (SYNTHROID, LEVOTHROID) 75 MCG tablet Take 75 mcg by mouth daily before breakfast.    . losartan (COZAAR) 25 MG tablet Take 25 mg by mouth every morning.  12  . Multiple Vitamins-Minerals (PRESERVISION/LUTEIN) CAPS Take 1 capsule by mouth 2 (two) times daily.    Marland Kitchen omeprazole (PRILOSEC) 20 MG capsule Take 20 mg by mouth daily before breakfast.     .  Tetrahydroz-Glyc-Hyprom-PEG (VISINE MAXIMUM REDNESS RELIEF) 0.05-0.2-0.36-1 % SOLN Place 1 drop into both eyes daily.    Marland Kitchen tiotropium (SPIRIVA HANDIHALER) 18 MCG inhalation capsule Place 18 mcg into inhaler and inhale daily.    . traMADol (ULTRAM) 50 MG tablet Take 1 tablet (50 mg total) by mouth every 6 (six) hours as needed. (Patient taking differently: Take 50-100 mg by mouth 3 (three) times daily as needed (for pain.). ) 40 tablet 0  . vitamin E 400 UNIT capsule Take 400 Units by mouth every morning.        Marland Kitchen apixaban  2.5 mg Oral BID  . aspirin EC  81 mg Oral Daily  . atorvastatin  20 mg Oral QPM  . cholecalciferol  1,000 Units Oral BH-q7a  . clopidogrel  75 mg Oral Daily  . fluticasone  2 spray Each Nare Daily  . hydrochlorothiazide  12.5 mg Oral BH-q7a  . levothyroxine  75 mcg Oral QAC breakfast  . loratadine  10 mg Oral Daily  . losartan  25 mg Oral BH-q7a  . mometasone-formoterol  2 puff Inhalation BID  . multivitamin-lutein  1 capsule Oral BID  . pantoprazole  40 mg Oral Daily  . tiotropium  18 mcg Inhalation Daily  . vitamin B-12  2,500 mcg Oral BH-q7a  . vitamin E  400 Units Oral BH-q7a    Infusions: .  sodium chloride 125 mL/hr at 06/03/16 1258    Allergies  Allergen Reactions  . Keflex [Cephalexin] Other (See Comments)    Patient states this medication made his legs hurt really bad.    Social History   Social History  . Marital status: Unknown    Spouse name: N/A  . Number of children: N/A  . Years of education: N/A   Occupational History  . Not on file.   Social History Main Topics  . Smoking status: Former Research scientist (life sciences)  . Smokeless tobacco: Former Systems developer    Quit date: 05/16/1993  . Alcohol use No  . Drug use: No  . Sexual activity: Not on file   Other Topics Concern  . Not on file   Social History Narrative  . No narrative on file    Family History  Problem Relation Age of Onset  . Heart failure Mother   . Stroke Father     PHYSICAL  EXAM: Vitals:   06/03/16 1159 06/03/16 1200  BP: (!) 156/52   Pulse: (!) 37 (!) 109  Resp:    Temp: 98.2 F (36.8 C)      Intake/Output Summary (Last 24 hours) at 06/03/16 1351 Last data filed at 06/03/16 0951  Gross per 24 hour  Intake              240 ml  Output                0 ml  Net              240 ml    General:  Well appearing. No respiratory difficulty HEENT: normal Neck: supple. no JVD. Carotids 2+ bilat; no bruits. No lymphadenopathy or thryomegaly appreciated. Cor: PMI nondisplaced. Regular rate & rhythm. No rubs, gallops or murmurs. Lungs: clear Abdomen: soft, nontender, nondistended. No hepatosplenomegaly. No bruits or masses. Good bowel sounds. Extremities: no cyanosis, clubbing, rash, edema Neuro: alert & oriented x 3, cranial nerves grossly intact. moves all 4 extremities w/o difficulty. Affect pleasant.  ECG:No EKG in the chart, frequent PVCs on the monitor  Results for orders placed or performed during the hospital encounter of 06/01/16 (from the past 24 hour(s))  Basic metabolic panel     Status: Abnormal   Collection Time: 06/03/16  5:11 AM  Result Value Ref Range   Sodium 140 135 - 145 mmol/L   Potassium 3.7 3.5 - 5.1 mmol/L   Chloride 113 (H) 101 - 111 mmol/L   CO2 20 (L) 22 - 32 mmol/L   Glucose, Bld 123 (H) 65 - 99 mg/dL   BUN 22 (H) 6 - 20 mg/dL   Creatinine, Ser 1.15 0.61 - 1.24 mg/dL   Calcium 8.5 (L) 8.9 - 10.3 mg/dL   GFR calc non Af Amer 54 (L) >60 mL/min   GFR calc Af Amer >60 >60 mL/min   Anion gap 7 5 - 15  CBC     Status: Abnormal   Collection Time: 06/03/16  5:11 AM  Result Value Ref Range   WBC 13.8 (H) 3.8 - 10.6 K/uL   RBC 3.93 (L) 4.40 - 5.90 MIL/uL   Hemoglobin 12.6 (L) 13.0 - 18.0 g/dL   HCT 37.3 (L) 40.0 - 52.0 %   MCV 95.0 80.0 - 100.0 fL   MCH 32.1 26.0 - 34.0 pg   MCHC 33.8 32.0 - 36.0 g/dL   RDW 15.2 (H) 11.5 - 14.5 %   Platelets 858 (H) 150 - 440 K/uL   No  results found.   ASSESSMENT AND PLAN:Frequent PVCs  with history of peripheral vascular disease. Advise getting echocardiogram and EKG and will make a recommendation based on that.  [name] A

## 2016-06-03 NOTE — Progress Notes (Signed)
Patient is having frequent PVCs, up to 3-4 in a 6-second strip.  Marcelle Overlie PA-C called, instructed to be in a Cardiology consult.  Patient is awake and alert, other VS stable.

## 2016-06-04 ENCOUNTER — Inpatient Hospital Stay
Admission: AD | Admit: 2016-06-04 | Discharge: 2016-06-04 | Disposition: A | Discharge status: Home / Self Care | Payer: Medicare Other | Source: Ambulatory Visit | Attending: Cardiovascular Disease | Admitting: Cardiovascular Disease

## 2016-06-04 LAB — CBC
HCT: 34.1 % — ABNORMAL LOW (ref 40.0–52.0)
Hemoglobin: 11.6 g/dL — ABNORMAL LOW (ref 13.0–18.0)
MCH: 32.2 pg (ref 26.0–34.0)
MCHC: 34 g/dL (ref 32.0–36.0)
MCV: 94.8 fL (ref 80.0–100.0)
PLATELETS: 867 10*3/uL — AB (ref 150–440)
RBC: 3.59 MIL/uL — ABNORMAL LOW (ref 4.40–5.90)
RDW: 14.8 % — ABNORMAL HIGH (ref 11.5–14.5)
WBC: 15 10*3/uL — ABNORMAL HIGH (ref 3.8–10.6)

## 2016-06-04 LAB — ECHOCARDIOGRAM COMPLETE
HEIGHTINCHES: 67 in
WEIGHTICAEL: 2758.4 [oz_av]

## 2016-06-04 LAB — BASIC METABOLIC PANEL
Anion gap: 9 (ref 5–15)
BUN: 18 mg/dL (ref 6–20)
CO2: 20 mmol/L — ABNORMAL LOW (ref 22–32)
CREATININE: 1.1 mg/dL (ref 0.61–1.24)
Calcium: 8.6 mg/dL — ABNORMAL LOW (ref 8.9–10.3)
Chloride: 107 mmol/L (ref 101–111)
GFR calc Af Amer: >60 mL/min (ref >60)
GFR, EST NON AFRICAN AMERICAN: 57 mL/min — AB (ref >60)
GLUCOSE: 123 mg/dL — AB (ref 65–99)
Potassium: 3.7 mmol/L (ref 3.5–5.1)
SODIUM: 136 mmol/L (ref 135–145)

## 2016-06-04 LAB — MAGNESIUM: MAGNESIUM: 1.5 mg/dL — AB (ref 1.7–2.4)

## 2016-06-04 MED ORDER — PERFLUTREN LIPID MICROSPHERE
1.0000 mL | INTRAVENOUS | Status: AC | PRN
  Administered 2016-06-04: 5 mL via INTRAVENOUS
  Filled 2016-06-04: qty 10

## 2016-06-04 NOTE — Progress Notes (Signed)
Visitors came by to speak for patient's son, [name], who is at home with the flu. They are wanting to get a second opinion about patient's foot before considering amputation. Stegymayer, PA notified- she is going to call the son to update him and discuss his concerns.

## 2016-06-04 NOTE — Consult Note (Signed)
[place] Nurse wound consult note Reason for Consult: RLE ulcerations (2), full thickness.  Patient with severe RLE PVD per Vascular PA (K. Stegmayer) note dated 06/03/16, s/p RLE angiogram with poor results and indicating that patient will most likely need amputation in the future.  He is to continue PT/OT while in house, disposition will be to an SNF. Wound type: Arterial insufficiencey Pressure Ulcer POA: No Measurement:Right medial malleolus:  4cm x 3cm x 0.2cm full thickness wound. 90% red, 10% yellow slough.  Immediately adjacent to this wound is a smaller (0.8cm x 0.5cm x 0.2cm) wound with an identical presentation.  Right lateral malleolus with 3cm x 2.6cm purple/maroon discoloration, no depth. Wound bed:  As described above Drainage (amount, consistency, odor) Small amount of exudate (light yellow) from medial malleolar ulcer. Periwound: intact, erythematous Dressing procedure/placement/frequency: Conservative wound care suggestions are indicated given results of angiogram. Goals are to prevent infection, reduce discomfort. I have provided Nursing with guidance for topical care via the Orders for twice daily cleansing, covering with white petrolatum (Vaseline) gauze, padding and securing with Kerlix.  Also for placement of foot into a pressure redistribution heel boot (Prevalon, at bedside) will protect from inadvertent injury.  [place] nursing team will not follow, but will remain available to this patient, the nursing and medical teams.  Please re-consult if needed. Thanks, Maudie Flakes, MSN, RN, [place], Arther Abbott  Pager# [phone]

## 2016-06-04 NOTE — Progress Notes (Signed)
*  PRELIMINARY RESULTS* Echocardiogram 2D Echocardiogram has been performed. Definity Contrast used on this Echocardiogram.  Andrew [name] Akeiba Axelson 06/04/2016, 12:25 PM

## 2016-06-04 NOTE — Clinical Social Work Note (Signed)
Clinical Social Work Assessment  Patient Details  Name: Andrew [name] MRN: [id number] Date of Birth: 28-May-1923  Date of referral:  06/04/16               Reason for consult:  Facility Placement                Permission sought to share information with:  Chartered certified accountant granted to share information::  Yes, Verbal Permission Granted  Name::        Agency::     Relationship::     Contact Information:     Housing/Transportation Living arrangements for the past 2 months:  Single Family Home Source of Information:  Patient Patient Interpreter Needed:  None Criminal Activity/Legal Involvement Pertinent to Current Situation/Hospitalization:  No - Comment as needed Significant Relationships:  Adult Children Lives with:  Self Do you feel safe going back to the place where you live?  Yes Need for family participation in patient care:  No (Coment)  Care giving concerns:  PT/OT rec STR   Social Worker assessment / plan:  CSW visited patient at bedside to discuss dc planning. The patient gave verbal permission to conduct a SNF bed search. The patient was alert and oriented x4. The patient did seem lethargic; therefore, the CSW attempted to contact family to confirm any preferences. The patient's son's listed number is disconnected. The patient's nephew did not answer; the CSW left a voicemail.  SNF referral sent. CSW will con't to attempt family contact.  Employment status:  Retired Nurse, adult PT Recommendations:  [place] / Referral to community resources:  [place]  Patient/Family's Response to care: The patient thanked the CSW for assistance.  Patient/Family's Understanding of and Emotional Response to Diagnosis, Current Treatment, and Prognosis:  The patient understands the need for continued care in a SNF and is in agreement.  Emotional Assessment Appearance:  Appears stated  age Attitude/Demeanor/Rapport:   (Patient was pleasant.) Affect (typically observed):  Apprehensive, Appropriate, Pleasant Orientation:  Oriented to Self, Oriented to Place, Oriented to  Time, Oriented to Situation Alcohol / Substance use:  Never Used Psych involvement (Current and /or in the community):  No (Comment)  Discharge Needs  Concerns to be addressed:  Discharge Planning Concerns Readmission within the last 30 days:  No Current discharge risk:  Chronically ill Barriers to Discharge:  Continued Medical Work up   [employer], LCSW 06/04/2016, 2:30 PM

## 2016-06-04 NOTE — Progress Notes (Signed)
SUBJECTIVE: Patient appears to be comfortable  Vitals:   06/03/16 1159 06/03/16 1200 06/03/16 1945 06/04/16 0302  BP: (!) 156/52  130/69 (!) 142/75  Pulse: (!) 37 (!) 109 (!) 110 (!) 107  Resp:   18 18  Temp: 98.2 F (36.8 C)  98.5 F (36.9 C) 98.2 F (36.8 C)  TempSrc: Oral  Oral Oral  SpO2: 97% 95% 94% 96%  Weight:      Height:        Intake/Output Summary (Last 24 hours) at 06/04/16 1138 Last data filed at 06/04/16 0005  Gross per 24 hour  Intake                0 ml  Output              200 ml  Net             -200 ml    LABS: Basic Metabolic Panel:  Recent Labs  06/03/16 0511 06/04/16 0455  NA 140 136  K 3.7 3.7  CL 113* 107  CO2 20* 20*  GLUCOSE 123* 123*  BUN 22* 18  CREATININE 1.15 1.10  CALCIUM 8.5* 8.6*  MG  --  1.5*   Liver Function Tests: No results for input(s): AST, ALT, ALKPHOS, BILITOT, PROT, ALBUMIN in the last 72 hours. No results for input(s): LIPASE, AMYLASE in the last 72 hours. CBC:  Recent Labs  06/03/16 0511 06/04/16 0455  WBC 13.8* 15.0*  HGB 12.6* 11.6*  HCT 37.3* 34.1*  MCV 95.0 94.8  PLT 858* 867*   Cardiac Enzymes: No results for input(s): CKTOTAL, CKMB, CKMBINDEX, TROPONINI in the last 72 hours. BNP: Invalid input(s): POCBNP D-Dimer: No results for input(s): DDIMER in the last 72 hours. Hemoglobin A1C: No results for input(s): HGBA1C in the last 72 hours. Fasting Lipid Panel: No results for input(s): CHOL, HDL, LDLCALC, TRIG, CHOLHDL, LDLDIRECT in the last 72 hours. Thyroid Function Tests: No results for input(s): TSH, T4TOTAL, T3FREE, THYROIDAB in the last 72 hours.  Invalid input(s): FREET3 Anemia Panel: No results for input(s): VITAMINB12, FOLATE, FERRITIN, TIBC, IRON, RETICCTPCT in the last 72 hours.   PHYSICAL EXAM General: Well developed, well nourished, in no acute distress HEENT:  Normocephalic and atramatic Neck:  No JVD.  Lungs: Clear bilaterally to auscultation and percussion. Heart: HRRR . Normal  S1 and S2 without gallops or murmurs.  Abdomen: Bowel sounds are positive, abdomen soft and non-tender  Msk:  Back normal, normal gait. Normal strength and tone for age. Extremities: No clubbing, cyanosis or edema.   Neuro: Alert and oriented X 3. Psych:  Good affect, responds appropriately  TELEMETRY:Monitor shows sinus rhythm with frequent PVCs and EKG done yesterday shows sinus tachycardia with frequent bigeminy and PVCs.  ASSESSMENT AND PLAN: Complex ventricular ectopy with history of peripheral vascular disease. Patient may have left ventricular systolic dysfunction and advise echocardiogram which is still pending.  Active Problems:   Peripheral vascular disease ([place])    Justn Quale A, MD, [facility] 06/04/2016 11:38 AM

## 2016-06-04 NOTE — Progress Notes (Signed)
Patient does not have any orders for CODE Status. Paged [place], [place] - order to place FULL CODE orders.

## 2016-06-04 NOTE — Progress Notes (Signed)
[facility]  Daily Progress Note   Subjective: 3 Days Post-Op: Ultrasound guidance for vascular access leftfemoral artery, Catheter placement into peroneal artery from right femoral approach, Aortogram and selective rightlower extremity angiogram, Percutaneous transluminal angioplasty of the peroneal artery and the tibioperoneal trunk with 3 mm diameter angioplasty balloon and 4 mm diameter Lutonix drug-coated balloon in the tibioperoneal trunk, Percutaneous transluminal angioplasty of the popliteal artery with 5 mm diameter Lutonix drug-coated balloon, Percutaneous transluminal angioplasty of the mid to distal superficial femoral artery with 6 mm diameter Lutonix drug-coated balloon, Catheter directed thrombolytic therapy to the right SFA, popliteal artery, tibioperoneal trunk, and peroneal arteries for thrombosis before and after above procedures. Mechanical rheolytic thrombectomy with the AngioJet Omni catheter to the right SFA, popliteal artery, tibioperoneal trunk, and peroneal arteries, Tigris stent placement to the right popliteal artery for residual stenosis and thrombus after above procedures using a 6 mm diameter by 10 cm length stent, Viabahn covered stent placement to the mid to distal superficial femoral artery for residual thrombus and stenosis after above procedures with a 6 mm diameter by 15 cm length stent with StarClose closure device left femoral artery.  Lethargic again today but arousable and oriented x3. Asked by son [name] [phone]) to call him in regard to obtaining a second opinion. Patient stil gives persmission for me to discuss his care with his son. Spoke with son and updated plan as to dispo to rehab, receiving wound care and being treated by cards. No amputation planned at this inpatient stay. Recommended speaking with rehab staff when discharged about obtaining a second opinion. I gave the locations of various vascular surgeons in the area. He is  interested in [place].   Patient still with some RLE discomfort which seems to be relieved by pain medication. Asking to be shaved today. Had echo done. Cards following. Appreciate wound care recommendations for local wound care.   Objective: Vitals:   06/03/16 1159 06/03/16 1200 06/03/16 1945 06/04/16 0302  BP: (!) 156/52  130/69 (!) 142/75  Pulse: (!) 37 (!) 109 (!) 110 (!) 107  Resp:   18 18  Temp: 98.2 F (36.8 C)  98.5 F (36.9 C) 98.2 F (36.8 C)  TempSrc: Oral  Oral Oral  SpO2: 97% 95% 94% 96%  Weight:      Height:        Intake/Output Summary (Last 24 hours) at 06/04/16 1435 Last data filed at 06/04/16 1200  Gross per 24 hour  Intake              240 ml  Output              200 ml  Net               40 ml   Physical Exam: A&Ox3, NAD CV: RRR Pulmonary: CTA Bilaterally Abdomen: Soft, Nontender, Nondistended Groin: No swelling or drainage noted. Right Lower Extremity: Wound stable. Foot Stable. No pedal pulses present. Cooler.    Laboratory: CBC    Component Value Date/Time   WBC 15.0 (H) 06/04/2016 0455   HGB 11.6 (L) 06/04/2016 0455   HCT 34.1 (L) 06/04/2016 0455   PLT 867 (H) 06/04/2016 0455   BMET    Component Value Date/Time   NA 136 06/04/2016 0455   K 3.7 06/04/2016 0455   CL 107 06/04/2016 0455   CO2 20 (L) 06/04/2016 0455   GLUCOSE 123 (H) 06/04/2016 0455   BUN 18 06/04/2016 0455   CREATININE  1.10 06/04/2016 0455   CALCIUM 8.6 (L) 06/04/2016 0455   GFRNONAA 57 (L) 06/04/2016 0455   GFRAA >60 06/04/2016 0455   Assessment/Planning: 82 year old male with severe RLE PAD and ankle wound s/p RLE angiogram with poor results will most likely need amputation in future. 1) Will plan on dispo to SNF 2) Appreciate local wound recommendations from wound care 3) Continue PT / OT while in house 4) Pain Control 5) Echo to be reviewed by cards  Marcelle Overlie PA-C 06/04/2016 2:35 PM

## 2016-06-04 NOTE — NC FL2 (Signed)
[place] LEVEL OF CARE SCREENING TOOL     IDENTIFICATION  Patient Name: Andrew [name] Birthdate: 04/10/1923 Sex: male Admission Date (Current Location): 06/01/2016  [place] and [place] Number:  Engineering geologist and Address:  [facility], [address], [place], [place] [address]      Provider Number: [id number]  Attending Physician Name and Address:  Algernon Huxley, MD  Relative Name and Phone Number:       Current Level of Care: Hospital Recommended Level of Care: [place] Prior Approval Number:    Date Approved/Denied:   PASRR Number: [id number]  Discharge Plan: SNF    Current Diagnoses: Patient Active Problem List   Diagnosis Date Noted  . Peripheral vascular disease ([place]) 06/01/2016  . Lower limb ulcer, ankle, right, limited to breakdown of skin ([place]) 04/14/2016  . Hyperlipidemia 04/14/2016  . Essential hypertension 04/14/2016  . Atherosclerosis of native artery of right lower extremity with ulceration of ankle ([place]) 04/14/2016  . Swelling of limb 04/14/2016  . Cellulitis 03/27/2016  . Cold foot with peripheral vascular disease ([place]) 03/27/2016  . Chronic venous hypertension with ulcer ([place]) 01/13/2016    Orientation RESPIRATION BLADDER Height & Weight     Self, Time, Situation, Place  Normal Incontinent Weight: 172 lb 6.4 oz (78.2 kg) Height:  5\' 7"  (170.2 cm)  BEHAVIORAL SYMPTOMS/MOOD NEUROLOGICAL BOWEL NUTRITION STATUS      Incontinent    AMBULATORY STATUS COMMUNICATION OF NEEDS Skin   Total Care Verbally Surgical wounds                       Personal Care Assistance Level of Assistance  Bathing, Dressing Bathing Assistance: Maximum assistance   Dressing Assistance: Maximum assistance     Functional Limitations Info             SPECIAL CARE FACTORS FREQUENCY  PT (By licensed PT), OT (By licensed OT)     PT Frequency: Up to 5X per day, 5X per week OT Frequency: Up to 5X per  day, 5X per week            Contractures Contractures Info: Present    Additional Factors Info  Allergies   Allergies Info: Keflex (Cephalexin)           Current Medications (06/04/2016):  This is the current hospital active medication list Current Facility-Administered Medications  Medication Dose Route Frequency Provider Last Rate Last Dose  . apixaban (ELIQUIS) tablet 2.5 mg  2.5 mg Oral BID Algernon Huxley, MD   2.5 mg at 06/04/16 [id number]  . aspirin EC tablet 81 mg  81 mg Oral Daily Algernon Huxley, MD   81 mg at 06/04/16 [id number]  . atorvastatin (LIPITOR) tablet 20 mg  20 mg Oral QPM Algernon Huxley, MD   20 mg at 06/03/16 1749  . cholecalciferol (VITAMIN D) tablet 1,000 Units  1,000 Units Oral [id number] Algernon Huxley, MD   1,000 Units at 06/04/16 [phone]  . clopidogrel (PLAVIX) tablet 75 mg  75 mg Oral Daily Algernon Huxley, MD   75 mg at 06/04/16 [id number]  . fluticasone (FLONASE) 50 MCG/ACT nasal spray 2 spray  2 spray Each Nare Daily Algernon Huxley, MD   2 spray at 06/04/16 0935  . hydrochlorothiazide (MICROZIDE) capsule 12.5 mg  12.5 mg Oral BH-q7a Algernon Huxley, MD   12.5 mg at 06/04/16 [id number]  . levothyroxine (SYNTHROID, LEVOTHROID) tablet 75 mcg  75 mcg Oral QAC breakfast Algernon Huxley, MD   75 mcg at 06/04/16 [phone]  . loratadine (CLARITIN) tablet 10 mg  10 mg Oral Daily Algernon Huxley, MD   10 mg at 06/04/16 [id number]  . losartan (COZAAR) tablet 25 mg  25 mg Oral BH-q7a Algernon Huxley, MD   25 mg at 06/04/16 [id number]  . mometasone-formoterol (DULERA) 200-5 MCG/ACT inhaler 2 puff  2 puff Inhalation BID Algernon Huxley, MD   2 puff at 06/04/16 0935  . morphine 4 MG/ML injection 1-2 mg  1-2 mg Intravenous Q2H PRN Algernon Huxley, MD   2 mg at 06/03/16 1557  . multivitamin-lutein (OCUVITE-LUTEIN) capsule 1 capsule  1 capsule Oral BID Algernon Huxley, MD   1 capsule at 06/04/16 [phone]  . pantoprazole (PROTONIX) EC tablet 40 mg  40 mg Oral Daily Algernon Huxley, MD   40 mg at 06/04/16 [id number]  . tiotropium (SPIRIVA) inhalation capsule 18 mcg  18 mcg  Inhalation Daily Algernon Huxley, MD   18 mcg at 06/04/16 0936  . traMADol (ULTRAM) tablet 50-100 mg  50-100 mg Oral TID PRN Algernon Huxley, MD   100 mg at 06/01/16 2029  . vitamin B-12 (CYANOCOBALAMIN) tablet 2,500 mcg  2,500 mcg Oral BH-q7a Algernon Huxley, MD   2,500 mcg at 06/04/16 [phone]  . vitamin E capsule 400 Units  400 Units Oral BH-q7a Algernon Huxley, MD   400 Units at 06/04/16 [id number]     Discharge Medications: Please see discharge summary for a list of discharge medications.  Relevant Imaging Results:  Relevant Lab Results:   Additional Information  SS # [id number]  Zettie Pho, LCSW

## 2016-06-05 DIAGNOSIS — I70238 Atherosclerosis of native arteries of right leg with ulceration of other part of lower right leg: Secondary | ICD-10-CM

## 2016-06-05 LAB — BASIC METABOLIC PANEL
ANION GAP: 7 (ref 5–15)
BUN: 24 mg/dL — ABNORMAL HIGH (ref 6–20)
CALCIUM: 8.4 mg/dL — AB (ref 8.9–10.3)
CO2: 23 mmol/L (ref 22–32)
Chloride: 104 mmol/L (ref 101–111)
Creatinine, Ser: 1.25 mg/dL — ABNORMAL HIGH (ref 0.61–1.24)
GFR, EST AFRICAN AMERICAN: 56 mL/min — AB (ref >60)
GFR, EST NON AFRICAN AMERICAN: 49 mL/min — AB (ref >60)
GLUCOSE: 119 mg/dL — AB (ref 65–99)
Potassium: 3.5 mmol/L (ref 3.5–5.1)
SODIUM: 134 mmol/L — AB (ref 135–145)

## 2016-06-05 LAB — CBC
HCT: 33.6 % — ABNORMAL LOW (ref 40.0–52.0)
Hemoglobin: 11.6 g/dL — ABNORMAL LOW (ref 13.0–18.0)
MCH: 32.5 pg (ref 26.0–34.0)
MCHC: 34.5 g/dL (ref 32.0–36.0)
MCV: 94.1 fL (ref 80.0–100.0)
PLATELETS: 884 10*3/uL — AB (ref 150–440)
RBC: 3.57 MIL/uL — AB (ref 4.40–5.90)
RDW: 15 % — AB (ref 11.5–14.5)
WBC: 14.6 10*3/uL — AB (ref 3.8–10.6)

## 2016-06-05 LAB — MAGNESIUM: MAGNESIUM: 1.7 mg/dL (ref 1.7–2.4)

## 2016-06-05 MED ORDER — METOPROLOL SUCCINATE ER 25 MG PO TB24
25.0000 mg | ORAL_TABLET | Freq: Every day | ORAL | Status: DC
  Administered 2016-06-05 – 2016-06-06 (×2): 25 mg via ORAL
  Filled 2016-06-05 (×2): qty 1

## 2016-06-05 NOTE — Progress Notes (Signed)
SUBJECTIVE: Patient appears to be comfortable  Vitals:   06/04/16 1434 06/04/16 2001 06/04/16 2126 06/05/16 0452  BP: 109/63 (!) 141/79  136/78  Pulse:  (!) 107 90 (!) 104  Resp:  18  18  Temp: 97.8 F (36.6 C) 98.2 F (36.8 C)  98.2 F (36.8 C)  TempSrc: Oral Oral  Oral  SpO2: 95% 96% 98% 95%  Weight:      Height:        Intake/Output Summary (Last 24 hours) at 06/05/16 0745 Last data filed at 06/04/16 1200  Gross per 24 hour  Intake              240 ml  Output                0 ml  Net              240 ml    LABS: Basic Metabolic Panel:  Recent Labs  06/04/16 0455 06/05/16 0416  NA 136 134*  K 3.7 3.5  CL 107 104  CO2 20* 23  GLUCOSE 123* 119*  BUN 18 24*  CREATININE 1.10 1.25*  CALCIUM 8.6* 8.4*  MG 1.5* 1.7   Liver Function Tests: No results for input(s): AST, ALT, ALKPHOS, BILITOT, PROT, ALBUMIN in the last 72 hours. No results for input(s): LIPASE, AMYLASE in the last 72 hours. CBC:  Recent Labs  06/04/16 0455 06/05/16 0416  WBC 15.0* 14.6*  HGB 11.6* 11.6*  HCT 34.1* 33.6*  MCV 94.8 94.1  PLT 867* 884*   Cardiac Enzymes: No results for input(s): CKTOTAL, CKMB, CKMBINDEX, TROPONINI in the last 72 hours. BNP: Invalid input(s): POCBNP D-Dimer: No results for input(s): DDIMER in the last 72 hours. Hemoglobin A1C: No results for input(s): HGBA1C in the last 72 hours. Fasting Lipid Panel: No results for input(s): CHOL, HDL, LDLCALC, TRIG, CHOLHDL, LDLDIRECT in the last 72 hours. Thyroid Function Tests: No results for input(s): TSH, T4TOTAL, T3FREE, THYROIDAB in the last 72 hours.  Invalid input(s): FREET3 Anemia Panel: No results for input(s): VITAMINB12, FOLATE, FERRITIN, TIBC, IRON, RETICCTPCT in the last 72 hours.   PHYSICAL EXAM General: Well developed, well nourished, in no acute distress HEENT:  Normocephalic and atramatic Neck:  No JVD.  Lungs: Clear bilaterally to auscultation and percussion. Heart: HRRR . Normal S1 and S2  without gallops or murmurs.  Abdomen: Bowel sounds are positive, abdomen soft and non-tender  Msk:  Back normal, normal gait. Normal strength and tone for age. Extremities: No clubbing, cyanosis or edema.   Neuro: Alert and oriented X 3. Psych:  Good affect, responds appropriately  TELEMETRY:Sinus rhythm with frequent PVCs  ASSESSMENT AND PLAN: Frequent PVCs with normal left reticular systolic function and mild diastolic dysfunction. Patient is asymptomatic and can be discharged.  Active Problems:   Peripheral vascular disease ([place])    KHAN,SHAUKAT A, MD, [facility] 06/05/2016 7:45 AM

## 2016-06-05 NOTE — Care Management (Signed)
Results of angiogram performed 12/1 not encouraging.  There is concern that patient will require amputation.  Cardiology has cleared. Has been evaluated by wound care staff and treatment will be conservative - prevent infection.

## 2016-06-05 NOTE — Progress Notes (Signed)
[facility]  Daily Progress Note   Subjective  - 4 Days Post-Op  Foot feeling better.  Less pain.  Ulcers about the same.  No signs of infection PT has recommended SNF, which he seems agreeable with  Objective Vitals:   06/04/16 2126 06/05/16 0452 06/05/16 0812 06/05/16 1149  BP:  136/78 (!) 142/85 (!) 109/48  Pulse: 90 (!) 104 100 (!) 49  Resp:  18 18 19   Temp:  98.2 F (36.8 C) 98.2 F (36.8 C)   TempSrc:  Oral Oral   SpO2: 98% 95% 96% 93%  Weight:      Height:        Intake/Output Summary (Last 24 hours) at 06/05/16 1354 Last data filed at 06/05/16 0940  Gross per 24 hour  Intake              240 ml  Output                0 ml  Net              240 ml    PULM  CTAB CV  RRR VASC  Foot warm, large medial ankle ulcer unchanged and no erythema or purulent drainage.   Laboratory CBC    Component Value Date/Time   WBC 14.6 (H) 06/05/2016 0416   HGB 11.6 (L) 06/05/2016 0416   HCT 33.6 (L) 06/05/2016 0416   PLT 884 (H) 06/05/2016 0416    BMET    Component Value Date/Time   NA 134 (L) 06/05/2016 0416   K 3.5 06/05/2016 0416   CL 104 06/05/2016 0416   CO2 23 06/05/2016 0416   GLUCOSE 119 (H) 06/05/2016 0416   BUN 24 (H) 06/05/2016 0416   CREATININE 1.25 (H) 06/05/2016 0416   CALCIUM 8.4 (L) 06/05/2016 0416   GFRNONAA 49 (L) 06/05/2016 0416   GFRAA 56 (L) 06/05/2016 0416    Assessment/Planning: POD #4 s/p RLE angiogram with revascularization   Tibial occlusive disease still present, but foot is stable and warm and his pain is improved.  Still reasonably possibility that amputation will be required, but not urgently  At this point, would continue anticoagulation and will look for placement in SNF with continued wound care and outpatient follow up.    Leotis Pain  06/05/2016, 1:54 PM

## 2016-06-05 NOTE — Progress Notes (Signed)
Dressing change to right lower leg performed

## 2016-06-05 NOTE — Progress Notes (Signed)
OT Cancellation Note  Patient Details Name: Andrew [name] MRN: [id number] DOB: 03-10-1923   Cancelled Treatment:    Reason Eval/Treat Not Completed: Other (comment) Attempted to see patient for OT therapy session but not available due to getting a dressing change and SW in discussing SNF placement options.  Will resume OT tomorrow.  Chrys Racer, OTR/L ascom [phone] 06/05/16, 4:21 PM

## 2016-06-05 NOTE — Progress Notes (Signed)
Physical Therapy Treatment Patient Details Name: Andrew [name] MRN: [id number] DOB: 10-Sep-1922 Today's Date: 06/05/2016    History of Present Illness 82 yo male with onset of RLE revascularization procedure for PVD, poor outcome, previously Non ambulatory, PVC's this afternoon.  PMHx: PVD, CKD, HTN, asthma    PT Comments    Pt presented in bed agreeable to therapy. Denies of pain in RLE although mentioned that he did have some pain earlier in am which had resolved. Pt performed supine to sit with HOB elevated and additional time required with minA for trunk support. Performed sit to/from stand with [facility]  Elevated x2 trials. First trial significant leaning against bed and posterior lean noted with pt having difficulty to correct despite verbal cues. Improved technique with second trial. Pt able to tolerate standing to fatigue with maximum time up to 45 seconds. Pt returned to bed 2/2 notified by nsg mult PVC's and able to perform rolling side to side with mod I and use of rails after episode of incontinence. Pt left in bed with alarm on and all needs met.   Follow Up Recommendations  SNF     Equipment Recommendations  None recommended by PT    Recommendations for Other Services       Precautions / Restrictions Precautions Precautions: Fall Restrictions Weight Bearing Restrictions: No    Mobility  Bed Mobility Overal bed mobility: Needs Assistance Bed Mobility: Supine to Sit;Sit to Supine     Supine to sit: Min assist Sit to supine: Min assist   General bed mobility comments: Min cues for sequencing., additional time required  Transfers Overall transfer level: Needs assistance Equipment used: Rolling walker (2 wheeled) Transfers: Sit to/from Stand Sit to Stand: Min assist         General transfer comment: Bed elevated  Ambulation/Gait                 Stairs            Wheelchair Mobility    Modified Rankin (Stroke Patients Only)       Balance  Overall balance assessment: Needs assistance Sitting-balance support: Feet supported Sitting balance-Leahy Scale: Good     Standing balance support: Bilateral upper extremity supported Standing balance-Leahy Scale: Fair Standing balance comment: Posterior lean noted and initially significant pushing of LE against bed improved on second trial                     Cognition Arousal/Alertness: Awake/alert Behavior During Therapy: WFL for tasks assessed/performed Overall Cognitive Status: Within Functional Limits for tasks assessed                      Exercises      General Comments        Pertinent Vitals/Pain Pain Assessment: No/denies pain    Home Living                      Prior Function            PT Goals (current goals can now be found in the care plan section) Acute Rehab PT Goals Patient Stated Goal: "I want to walk again""    Frequency    Min 2X/week      PT Plan      Co-evaluation             End of Session Equipment Utilized During Treatment: Gait belt Activity Tolerance: Patient tolerated treatment well Patient left:  in bed;with call bell/phone within reach;with bed alarm set     Time: 9509-3267 PT Time Calculation (min) (ACUTE ONLY): 32 min  Charges:  $Therapeutic Activity: 23-37 mins                    G Codes:      Rosita DeChalus  Rosita DeChalus, PTA 06/05/2016, 11:40 AM

## 2016-06-05 NOTE — Clinical Social Work Note (Signed)
MSW spoke to patient and he stated he would like to return to [facility].  MSW faxed clinical information to patient's insurance company awaiting insurance authorization.  MSW to continue to follow patient's progress throughout discharge planning.  Andrew [name]. Norval Morton, MSW [phone]  Mon-Fri 8a-4:30p 06/05/2016 5:26 PM

## 2016-06-06 DIAGNOSIS — I70238 Atherosclerosis of native arteries of right leg with ulceration of other part of lower right leg: Secondary | ICD-10-CM

## 2016-06-06 MED ORDER — TRAMADOL HCL 50 MG PO TABS: 50.0000 mg | ORAL_TABLET | Freq: Three times a day (TID) | ORAL | 2 refills | Status: DC | PRN

## 2016-06-06 MED ORDER — APIXABAN 2.5 MG PO TABS: 2.5000 mg | ORAL_TABLET | Freq: Two times a day (BID) | ORAL | 3 refills | Status: DC

## 2016-06-06 NOTE — Clinical Social Work Placement (Signed)
   CLINICAL SOCIAL WORK PLACEMENT  NOTE  Date:  06/06/2016  Patient Details  Name: Andrew [name] MRN: [id number] Date of Birth: Jun 09, 1923  Clinical Social Work is seeking post-discharge placement for this patient at the [place] level of care (*CSW will initial, date and re-position this form in  chart as items are completed):  Yes   Patient/family provided with [place] Work Department's list of facilities offering this level of care within the geographic area requested by the patient (or if unable, by the patient's family).  Yes   Patient/family informed of their freedom to choose among providers that offer the needed level of care, that participate in Medicare, Medicaid or managed care program needed by the patient, have an available bed and are willing to accept the patient.  Yes   Patient/family informed of [place]'s ownership interest in [facility] and [facility], as well as of the fact that they are under no obligation to receive care at these facilities.  PASRR submitted to EDS on       PASRR number received on       Existing PASRR number confirmed on 06/04/16     FL2 transmitted to all facilities in geographic area requested by pt/family on 06/04/16     FL2 transmitted to all facilities within larger geographic area on       Patient informed that his/her managed care company has contracts with or will negotiate with certain facilities, including the following:        Yes   Patient/family informed of bed offers received.  Patient chooses bed at [facility]     Physician recommends and patient chooses bed at      Patient to be transferred to [facility] on  .  Patient to be transferred to facility by [facility]     Patient family notified on 06/06/16 of transfer.  Name of family member notified:  [name] patient's son [phone]     PHYSICIAN       Additional Comment:     _______________________________________________ [name] 06/06/2016, 5:16 PM

## 2016-06-06 NOTE — Progress Notes (Signed)
Report called to [place] RN at [facility].

## 2016-06-06 NOTE — Progress Notes (Signed)
Patient discharged per MD order and hospital protocol via EMS. Discharge paperwork given to EMS. Notified [place] of his departure.

## 2016-06-06 NOTE — Care Management Important Message (Signed)
Important Message  Patient Details  Name: SCHYLAR FRIDAY MRN: [id number] Date of Birth: 1922-12-06   Medicare Important Message Given:  Yes    Katrina Stack, RN 06/06/2016, 12:21 PM

## 2016-06-06 NOTE — Progress Notes (Signed)
SUBJECTIVE: Patient is feeling much better  Vitals:   06/05/16 1149 06/05/16 1925 06/06/16 0524 06/06/16 1010  BP: (!) 109/48 124/63 123/71 114/60  Pulse: (!) 49 91 89 99  Resp: 19 18 18 16   Temp:  97.9 F (36.6 C) 97.8 F (36.6 C) 97.4 F (36.3 C)  TempSrc:  Oral  Oral  SpO2: 93% 95% 94% 91%  Weight:      Height:        Intake/Output Summary (Last 24 hours) at 06/06/16 1012 Last data filed at 06/05/16 2257  Gross per 24 hour  Intake              480 ml  Output               50 ml  Net              430 ml    LABS: Basic Metabolic Panel:  Recent Labs  06/04/16 0455 06/05/16 0416  NA 136 134*  K 3.7 3.5  CL 107 104  CO2 20* 23  GLUCOSE 123* 119*  BUN 18 24*  CREATININE 1.10 1.25*  CALCIUM 8.6* 8.4*  MG 1.5* 1.7   Liver Function Tests: No results for input(s): AST, ALT, ALKPHOS, BILITOT, PROT, ALBUMIN in the last 72 hours. No results for input(s): LIPASE, AMYLASE in the last 72 hours. CBC:  Recent Labs  06/04/16 0455 06/05/16 0416  WBC 15.0* 14.6*  HGB 11.6* 11.6*  HCT 34.1* 33.6*  MCV 94.8 94.1  PLT 867* 884*   Cardiac Enzymes: No results for input(s): CKTOTAL, CKMB, CKMBINDEX, TROPONINI in the last 72 hours. BNP: Invalid input(s): POCBNP D-Dimer: No results for input(s): DDIMER in the last 72 hours. Hemoglobin A1C: No results for input(s): HGBA1C in the last 72 hours. Fasting Lipid Panel: No results for input(s): CHOL, HDL, LDLCALC, TRIG, CHOLHDL, LDLDIRECT in the last 72 hours. Thyroid Function Tests: No results for input(s): TSH, T4TOTAL, T3FREE, THYROIDAB in the last 72 hours.  Invalid input(s): FREET3 Anemia Panel: No results for input(s): VITAMINB12, FOLATE, FERRITIN, TIBC, IRON, RETICCTPCT in the last 72 hours.   PHYSICAL EXAM General: Well developed, well nourished, in no acute distress HEENT:  Normocephalic and atramatic Neck:  No JVD.  Lungs: Clear bilaterally to auscultation and percussion. Heart: HRRR . Normal S1 and S2 without  gallops or murmurs.  Abdomen: Bowel sounds are positive, abdomen soft and non-tender  Msk:  Back normal, normal gait. Normal strength and tone for age. Extremities: No clubbing, cyanosis or edema.   Neuro: Alert and oriented X 3. Psych:  Good affect, responds appropriately  TELEMETRY: NSR  ASSESSMENT AND PLAN: Occasional PVCS with normal LVEF. Continue metoprolol. Active Problems:   Peripheral vascular disease ([place])    Andrew Hilyer A, MD, [facility] 06/06/2016 10:12 AM

## 2016-06-06 NOTE — Clinical Social Work Note (Addendum)
MSW spoke to patient's son [name] [phone] to inform him that patient will be going to [facility] once insurance approval has gone through.  MSW to continue to follow patient's progress throughout discharge planning.  Patient to be d/c'ed today to [facility].  Patient and family agreeable to plans will transport via ems RN to call report to room [place] [phone].  Jones Broom. Carel Schnee, MSW [phone]  Mon-Fri 8a-4:30p 06/06/2016 11:59 AM

## 2016-06-06 NOTE — Progress Notes (Signed)
Notified EMS of non-emergent transfer to [facility].

## 2016-06-06 NOTE — Discharge Summary (Signed)
Andrew [name]    Discharge Summary    Patient ID:  Andrew [name] MRN: [id number] DOB/AGE: 1923/02/25 82 y.o.  Admit date: 06/01/2016 Discharge date: 06/06/2016 Date of Surgery: 06/01/2016 Surgeon: Surgeon(s): Algernon Huxley, MD  Admission Diagnosis: Right lower extremity angio     ASO w claudication  Discharge Diagnoses:  Right lower extremity angio     ASO w claudication  Secondary Diagnoses: Past Medical History:  Diagnosis Date  . Asthma   . Chronic kidney disease   . Hypertension   . Peripheral vascular disease ([place])     Procedure(s): Lower Extremity Angiography Lower Extremity Intervention  Discharged Condition: good  HPI:  Patient with severe RLE ischemia and worsening ulceration.  He is brought in for attempt at revascularization  Hospital Course:  Andrew [name] is a 82 y.o. male is S/P Right Procedure(s): Lower Extremity Angiography Lower Extremity Intervention Extubated: NA Physical exam: foot warm with good capillary refill, wounds are stable and not infected at this point Post-op wounds healing well Pt. Ambulating, voiding and taking PO diet without difficulty. Pt pain controlled with PO pain meds. Labs as below Complications:none  Consults:  Treatment Team:  Dionisio David, MD  Significant Diagnostic Studies: CBC Lab Results  Component Value Date   WBC 14.6 (H) 06/05/2016   HGB 11.6 (L) 06/05/2016   HCT 33.6 (L) 06/05/2016   MCV 94.1 06/05/2016   PLT 884 (H) 06/05/2016    BMET    Component Value Date/Time   NA 134 (L) 06/05/2016 0416   K 3.5 06/05/2016 0416   CL 104 06/05/2016 0416   CO2 23 06/05/2016 0416   GLUCOSE 119 (H) 06/05/2016 0416   BUN 24 (H) 06/05/2016 0416   CREATININE 1.25 (H) 06/05/2016 0416   CALCIUM 8.4 (L) 06/05/2016 0416   GFRNONAA 49 (L) 06/05/2016 0416   GFRAA 56 (L) 06/05/2016 0416   COAG No results found for: INR, PROTIME   Disposition:  Discharge to :Skilled nursing  facility Discharge Instructions    Discharge patient    Complete by:  As directed        Medication List    TAKE these medications   apixaban 2.5 MG Tabs tablet Commonly known as:  ELIQUIS Take 1 tablet (2.5 mg total) by mouth 2 (two) times daily. What changed:  when to take this   aspirin EC 81 MG tablet Take 1 tablet (81 mg total) by mouth daily.   atorvastatin 20 MG tablet Commonly known as:  LIPITOR Take 1 tablet (20 mg total) by mouth daily. What changed:  when to take this   Cholecalciferol 1000 units capsule Take 1,000 Units by mouth every morning.   clopidogrel 75 MG tablet Commonly known as:  PLAVIX Take 75 mg by mouth daily.   fexofenadine 180 MG tablet Commonly known as:  ALLEGRA Take 180 mg by mouth daily.   fluticasone 27.5 MCG/SPRAY nasal spray Commonly known as:  VERAMYST Place 2 sprays into the nose daily.   Fluticasone-Salmeterol 250-50 MCG/DOSE Aepb Commonly known as:  ADVAIR Inhale 1 puff into the lungs 2 (two) times daily.   hydrochlorothiazide 12.5 MG capsule Commonly known as:  MICROZIDE Take 12.5 mg by mouth every morning.   levothyroxine 75 MCG tablet Commonly known as:  SYNTHROID, LEVOTHROID Take 75 mcg by mouth daily before breakfast.   losartan 25 MG tablet Commonly known as:  COZAAR Take 25 mg by mouth every morning.   omeprazole 20 MG capsule Commonly  known as:  PRILOSEC Take 20 mg by mouth daily before breakfast.   PRESERVISION/LUTEIN Caps Take 1 capsule by mouth 2 (two) times daily.   SPIRIVA HANDIHALER 18 MCG inhalation capsule Generic drug:  tiotropium Place 18 mcg into inhaler and inhale daily.   traMADol 50 MG tablet Commonly known as:  ULTRAM Take 1-2 tablets (50-100 mg total) by mouth 3 (three) times daily as needed (for pain.).   VISINE MAXIMUM REDNESS RELIEF 0.05-0.2-0.36-1 % Soln Generic drug:  Tetrahydroz-Glyc-Hyprom-PEG Place 1 drop into both eyes daily.   Vitamin B-12 2500 MCG Subl Place 2,500 mcg  under the tongue every morning.   vitamin E 400 UNIT capsule Take 400 Units by mouth every morning.      Verbal and written Discharge instructions given to the patient. Wound care per Discharge AVS Follow-up Information    KIMBERLY A STEGMAYER, PA-C Follow up in 3 week(s).   Specialty:  Physician Assistant Why:  for wound check Contact information: [place] [address] [phone]           Signed: Leotis Pain, MD  06/06/2016, 8:42 AM

## 2016-06-06 NOTE — Progress Notes (Signed)
Occupational Therapy Treatment Patient Details Name: MORELL BRACE MRN: [id number] DOB: 07-14-1922 Today's Date: 06/06/2016    History of present illness Pt. is a 82 y.o. male who was admitted to [facility] for a RLE revascularization procedure secondary to PVD. PMHx includes: PVD, CKD, HTN, asthma   OT comments  Pt. Is a 82 y.o. Male who was admitted to [facility] for a RLE revascularization procedure secondary to PVD. Pt. continues to benefit from skilled OT services to work on improving ADL functioning. Pt. Continues to benefit from skilled OT services for ADL training, A/E training as needed, UE there. Ex, functional mobility, and pt. education about energy conservation, work simplification techniques, home modification, and DME. Pt. Plans to go to SNF at discharge. Follow-up OT services are warranted.   Follow Up Recommendations  SNF    Equipment Recommendations       Recommendations for Other Services      Precautions / Restrictions                                               ADL Overall ADL's : Needs assistance/impaired Eating/Feeding: Set up   Grooming: Set up;Sitting               Lower Body Dressing: Maximal assistance               Functional mobility during ADLs: Minimal assistance;Moderate assistance General ADL Comments: Pt. education was provided about A/E use for LE ADLs. Pt. was provided with education, and a visual handout about energy conservation.      Vision                     Perception     Praxis      Cognition   Behavior During Therapy: WFL for tasks assessed/performed Overall Cognitive Status: Within Functional Limits for tasks assessed                       Extremity/Trunk Assessment               Exercises     Shoulder Instructions       General Comments      Pertinent Vitals/ Pain       Pain Assessment: 0-10 Pain Score: 0-No pain  Home Living                                          Prior Functioning/Environment              Frequency  Min 1X/week        Progress Toward Goals  OT Goals(current goals can now be found in the care plan section)     Acute Rehab OT Goals Patient Stated Goal: To get better OT Goal Formulation: With patient Potential to Achieve Goals: Good  Plan Discharge plan remains appropriate    Co-evaluation                 End of Session     Activity Tolerance Patient tolerated treatment well   Patient Left in bed;with call bell/phone within reach;with bed alarm set   Nurse Communication          Time: [id number] OT Time Calculation (min): 23 min  Charges: OT General Charges $OT Visit: 1 Procedure OT Treatments $Self Care/Home Management : 23-37 mins  Harrel Carina, MS, OTR/L 06/06/2016, 10:32 AM

## 2016-06-06 NOTE — Progress Notes (Signed)
Physical Therapy Treatment Patient Details Name: Andrew [name] MRN: [id number] DOB: 04/13/1923 Today's Date: 06/23/16    History of Present Illness Pt. is a 82 yo male who was admitted to [facility] for a RLE revascularization procedure secondary to PVD. PMHx includes: PVD, CKD, HTN, asthma    PT Comments    Pt was seen for bed exercises, tired and declined up for now.  He is planning to leave for SNF when released from hospital, but has also hopes of going home from there.  Will continue acutely to see for strengthening and transfer training as able.  Follow Up Recommendations  SNF     Equipment Recommendations  None recommended by PT    Recommendations for Other Services       Precautions / Restrictions Precautions Precautions: Fall Restrictions Weight Bearing Restrictions: No    Mobility  Bed Mobility Overal bed mobility: Needs Assistance                Transfers                    Ambulation/Gait                 Stairs            Wheelchair Mobility    Modified Rankin (Stroke Patients Only)       Balance                                    Cognition Arousal/Alertness: Awake/alert Behavior During Therapy: WFL for tasks assessed/performed Overall Cognitive Status: Within Functional Limits for tasks assessed                      Exercises General Exercises - Lower Extremity Ankle Circles/Pumps: AROM;Both;10 reps Quad Sets: AROM;Both;15 reps (pt required dense cues to complete) Gluteal Sets: AROM;Both;15 reps Heel Slides: AROM;Both;15 reps (x 2) Hip ABduction/ADduction: AROM;Both;15 reps (x 2) Hip Flexion/Marching: AROM;Both;15 reps    General Comments        Pertinent Vitals/Pain Pain Assessment: No/denies pain Pain Score: 0-No pain    Home Living                      Prior Function            PT Goals (current goals can now be found in the care plan section) Acute Rehab PT  Goals Patient Stated Goal: To get better Progress towards PT goals: Progressing toward goals    Frequency    Min 2X/week      PT Plan Current plan remains appropriate    Co-evaluation             End of Session   Activity Tolerance: Patient tolerated treatment well Patient left: in bed;with call bell/phone within reach;with bed alarm set     Time: [id number] PT Time Calculation (min) (ACUTE ONLY): 18 min  Charges:  $Therapeutic Exercise: 8-22 mins                    G Codes:      Ramond Dial 06/23/2016, 1:09 PM    Mee Hives, PT MS Acute Rehab Dept. Number: [facility]

## 2016-06-08 ENCOUNTER — Telehealth (INDEPENDENT_AMBULATORY_CARE_PROVIDER_SITE_OTHER): Payer: Self-pay

## 2016-06-08 NOTE — Telephone Encounter (Signed)
Kasey from [facility] called wanting a clarification on the patient's wound care of his right leg because it was not in his discharge summary.

## 2016-06-08 NOTE — Telephone Encounter (Signed)
Returned the call to [place] regarding the patient's wound care. Per Hezzie Bump the it should be twice daily cleansing, covering with Vaseline gauze, padding and securing with Kerlix. The patient should also have a pressure heel boot. Myriam Jacobson received the instructions verbally.

## 2016-06-09 ENCOUNTER — Ambulatory Visit: Payer: Medicare Other | Admitting: Podiatry

## 2016-06-20 ENCOUNTER — Encounter (INDEPENDENT_AMBULATORY_CARE_PROVIDER_SITE_OTHER): Payer: Self-pay | Admitting: Vascular Surgery

## 2016-06-20 ENCOUNTER — Ambulatory Visit (INDEPENDENT_AMBULATORY_CARE_PROVIDER_SITE_OTHER): Payer: Medicare Other | Admitting: Vascular Surgery

## 2016-06-20 VITALS — BP 119/75 | HR 52 | Resp 16 | Wt 163.0 lb

## 2016-06-20 DIAGNOSIS — I739 Peripheral vascular disease, unspecified: Secondary | ICD-10-CM | POA: Diagnosis not present

## 2016-06-20 DIAGNOSIS — I70233 Atherosclerosis of native arteries of right leg with ulceration of ankle: Secondary | ICD-10-CM

## 2016-06-20 DIAGNOSIS — E785 Hyperlipidemia, unspecified: Secondary | ICD-10-CM

## 2016-06-20 NOTE — Progress Notes (Signed)
Subjective:    Patient ID: Andrew [name], male    DOB: [birth year], 82 y.o.   MRN: [id number] Chief Complaint  Patient presents with  . Wound Check   Patient presents for his first post-procedure follow up. On 06/01/16, the patient underwent an aortogram and selective right lower extremity angiogram, percutaneous transluminal angioplasty of the peroneal artery and the tibioperoneal trunk with 3 mm diameter angioplasty balloon and 4 mm diameter Lutonix drug-coated balloon in the tibioperoneal trunk, percutaneous transluminal angioplasty of the popliteal artery with 5 mm diameter Lutonix drug-coated balloon, percutaneous transluminal angioplasty of the mid to distal superficial femoral artery with 6 mm diameter Lutonix drug-coated balloon, catheter directed thrombolytic therapy to the right SFA, popliteal artery, tibioperoneal trunk, and peroneal arteries for thrombosis before and after above procedures, mechanical rheolytic thrombectomy with the AngioJet Omni catheter to the right SFA, popliteal artery, tibioperoneal trunk, and peroneal arteries, tigris stent placement to the right popliteal artery for residual stenosis and thrombus after above procedures using a 6 mm diameter by 10 cm length stent, viabahn covered stent placement to the mid to distal superficial femoral artery for residual thrombus and stenosis after above procedures with a 6 mm diameter by 15 cm length stent with StarClose closure device left femoral artery. He has been residing at [facility] since discharge. Local wound care being provided by nursing at rehab. Patient states "pain is not that bad". There was discussion during his inpatient stay about obtaining a second opinion as the patient will most likely need an amputation in the future. Patient states he is not interested in getting a second opinion. Unsure what family is planning. He understands further endovascular intervention at this point will not be useful. He also  understands amputation will be considered based on his pain and status of his wound. He denies any fever, nausea or vomiting. He denies any worsening of his right ankle ulcerations.    Review of Systems  Constitutional: Negative.   HENT: Negative.   Eyes: Negative.   Respiratory: Negative.   Cardiovascular: Positive for leg swelling (Right Extremity).       Right Lower Extremity Pain  Gastrointestinal: Negative.   Endocrine: Negative.   Genitourinary: Negative.   Musculoskeletal: Negative.   Skin:       Right Ankle Wounds  Allergic/Immunologic: Negative.   Neurological: Negative.   Hematological: Negative.   Psychiatric/Behavioral: Negative.       Objective:   Physical Exam  Constitutional: He is oriented to person, place, and time. He appears well-developed and well-nourished.  In wheelchair  HENT:  Head: Normocephalic and atraumatic.  Right Ear: External ear normal.  Left Ear: External ear normal.  Eyes: Conjunctivae and EOM are normal. Pupils are equal, round, and reactive to light.  Neck: Normal range of motion.  Cardiovascular: Normal rate, regular rhythm, normal heart sounds and intact distal pulses.   Pulses:      Radial pulses are 2+ on the right side, and 2+ on the left side.       Dorsalis pedis pulses are 0 on the right side, and 1+ on the left side.       Posterior tibial pulses are 0 on the right side, and 1+ on the left side.  Pulmonary/Chest: Effort normal and breath sounds normal.  Abdominal: Soft. Bowel sounds are normal.  Musculoskeletal: Normal range of motion. He exhibits edema (Moderate Right Lower Extremity Swelling).  Neurological: He is alert and oriented to person, place, and time.  Skin:  Stable right ankle ulceration noted. Non-infected. Minimal drainage. Limited to skin and fat breakdown.   Psychiatric: He has a normal mood and affect. His behavior is normal. Judgment and thought content normal.   BP 119/75   Pulse (!) 52   Resp 16   Wt 163  lb (73.9 kg)   BMI 25.53 kg/m   Past Medical History:  Diagnosis Date  . Asthma   . Chronic kidney disease   . Hypertension   . Peripheral vascular disease [facility])    Social History   Social History  . Marital status: Unknown    Spouse name: N/A  . Number of children: N/A  . Years of education: N/A   Occupational History  . Not on file.   Social History Main Topics  . Smoking status: Former Research scientist (life sciences)  . Smokeless tobacco: Former Systems developer    Quit date: 05/16/1993  . Alcohol use No  . Drug use: No  . Sexual activity: Not on file   Other Topics Concern  . Not on file   Social History Narrative  . No narrative on file   Past Surgical History:  Procedure Laterality Date  . PERIPHERAL VASCULAR CATHETERIZATION Right 02/07/2016   Procedure: Lower Extremity Angiography;  Surgeon: Algernon Huxley, MD;  Location: [place] [facility];  Service: Cardiovascular;  Laterality: Right;  . PERIPHERAL VASCULAR CATHETERIZATION Right 03/27/2016   Procedure: Lower Extremity Angiography;  Surgeon: Algernon Huxley, MD;  Location: [place] [facility];  Service: Cardiovascular;  Laterality: Right;  . PERIPHERAL VASCULAR CATHETERIZATION  03/27/2016   Procedure: Lower Extremity Intervention;  Surgeon: Algernon Huxley, MD;  Location: [place] [facility];  Service: Cardiovascular;;  . PERIPHERAL VASCULAR CATHETERIZATION Right 03/28/2016   Procedure: Lower Extremity Angiography;  Surgeon: Algernon Huxley, MD;  Location: [place] [facility];  Service: Cardiovascular;  Laterality: Right;  . PERIPHERAL VASCULAR CATHETERIZATION Right 06/01/2016   Procedure: Lower Extremity Angiography;  Surgeon: Algernon Huxley, MD;  Location: [place] [facility];  Service: Cardiovascular;  Laterality: Right;  . PERIPHERAL VASCULAR CATHETERIZATION  06/01/2016   Procedure: Lower Extremity Intervention;  Surgeon: Algernon Huxley, MD;  Location: [place] [facility];  Service: Cardiovascular;;  . SHOULDER SURGERY  1974  . UPPER GI ENDOSCOPY     Family  History  Problem Relation Age of Onset  . Heart failure Mother   . Stroke Father    Allergies  Allergen Reactions  . Keflex [Cephalexin] Other (See Comments)    Patient states this medication made his legs hurt really bad.      Assessment & Plan:  Patient presents for his first post-procedure follow up. On 06/01/16, the patient underwent an aortogram and selective right lower extremity angiogram, percutaneous transluminal angioplasty of the peroneal artery and the tibioperoneal trunk with 3 mm diameter angioplasty balloon and 4 mm diameter Lutonix drug-coated balloon in the tibioperoneal trunk, percutaneous transluminal angioplasty of the popliteal artery with 5 mm diameter Lutonix drug-coated balloon, percutaneous transluminal angioplasty of the mid to distal superficial femoral artery with 6 mm diameter Lutonix drug-coated balloon, catheter directed thrombolytic therapy to the right SFA, popliteal artery, tibioperoneal trunk, and peroneal arteries for thrombosis before and after above procedures, mechanical rheolytic thrombectomy with the AngioJet Omni catheter to the right SFA, popliteal artery, tibioperoneal trunk, and peroneal arteries, tigris stent placement to the right popliteal artery for residual stenosis and thrombus after above procedures using a 6 mm diameter by 10 cm length stent, viabahn covered stent  placement to the mid to distal superficial femoral artery for residual thrombus and stenosis after above procedures with a 6 mm diameter by 15 cm length stent with StarClose closure device left femoral artery. He has been residing at [facility] since discharge. Local wound care being provided by nursing at rehab. Patient states "pain is not that bad". There was discussion during his inpatient stay about obtaining a second opinion as the patient will most likely need an amputation in the future. Patient states he is not interested in getting a second opinion. Unsure what family is  planning. He understands further endovascular intervention at this point will not be useful. He also understands amputation will be considered based on his pain and status of his wound. He denies any fever, nausea or vomiting. He denies any worsening of his right ankle ulcerations.   1. 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 Patient understands this.  Recommend Santyl to right ankle wounds. RX for oxycodone 5mg  one to two tabs PO q4-6 hours PRN pain given Patient to follow up in one month for wound check or before if condition worsens.  2. Peripheral vascular disease (HCC) - Stable Continue Eliquis, ASA and statin for medical optimization.   3. Hyperlipidemia, unspecified hyperlipidemia type - Stable Continue ASA and statin for medical optimization. Encouraged good control as its slows the progression of atherosclerotic disease  Current Outpatient Prescriptions on File Prior to Visit  Medication Sig Dispense Refill  . apixaban (ELIQUIS) 2.5 MG TABS tablet Take 1 tablet (2.5 mg total) by mouth 2 (two) times daily. 60 tablet 3  . aspirin EC 81 MG tablet Take 1 tablet (81 mg total) by mouth daily. 150 tablet 2  . atorvastatin (LIPITOR) 20 MG tablet Take 1 tablet (20 mg total) by mouth daily. (Patient taking differently: Take 20 mg by mouth every evening. ) 30 tablet 3  . Cholecalciferol 1000 units capsule Take 1,000 Units by mouth every morning.    . clopidogrel (PLAVIX) 75 MG tablet Take 75 mg by mouth daily.   10  . Cyanocobalamin (VITAMIN B-12) 2500 MCG SUBL Place 2,500 mcg under the tongue every morning.    . fexofenadine (ALLEGRA) 180 MG tablet Take 180 mg by mouth daily.    . fluticasone (VERAMYST) 27.5 MCG/SPRAY nasal spray Place 2 sprays into the nose daily.    . hydrochlorothiazide (MICROZIDE) 12.5 MG capsule Take 12.5 mg by mouth every morning.     Marland Kitchen  levothyroxine (SYNTHROID, LEVOTHROID) 75 MCG tablet Take 75 mcg by mouth daily before breakfast.    . losartan (COZAAR) 25 MG tablet Take 25 mg by mouth every morning.  12  . Multiple Vitamins-Minerals (PRESERVISION/LUTEIN) CAPS Take 1 capsule by mouth 2 (two) times daily.    Marland Kitchen omeprazole (PRILOSEC) 20 MG capsule Take 20 mg by mouth daily before breakfast.     . tiotropium (SPIRIVA HANDIHALER) 18 MCG inhalation capsule Place 18 mcg into inhaler and inhale daily.    . traMADol (ULTRAM) 50 MG tablet Take 1-2 tablets (50-100 mg total) by mouth 3 (three) times daily as needed (for pain.). 30 tablet 2  . vitamin E 400 UNIT capsule Take 400 Units by mouth every morning.     . Fluticasone-Salmeterol (ADVAIR) 250-50 MCG/DOSE AEPB Inhale 1 puff into the lungs 2 (two) times daily.    . Tetrahydroz-Glyc-Hyprom-PEG (VISINE MAXIMUM REDNESS RELIEF) 0.05-0.2-0.36-1 % SOLN  Place 1 drop into both eyes daily.     No current facility-administered medications on file prior to visit.     There are no Patient Instructions on file for this visit. No Follow-up on file.   Barry Faircloth A Ananias Kolander, PA-C

## 2016-07-11 ENCOUNTER — Other Ambulatory Visit
Admission: RE | Admit: 2016-07-11 | Discharge: 2016-07-11 | Disposition: A | Discharge status: Home / Self Care | Payer: Medicare Other | Source: Ambulatory Visit | Attending: Family Medicine | Admitting: Family Medicine

## 2016-07-11 DIAGNOSIS — N390 Urinary tract infection, site not specified: Secondary | ICD-10-CM | POA: Insufficient documentation

## 2016-07-13 LAB — URINE CULTURE

## 2016-07-23 ENCOUNTER — Emergency Department: Payer: Medicare Other

## 2016-07-23 ENCOUNTER — Inpatient Hospital Stay
Admission: EM | Admit: 2016-07-23 | Discharge: 2016-07-31 | DRG: 871 | Disposition: A | Discharge status: Hospice | Payer: Medicare Other | Attending: Internal Medicine | Admitting: Internal Medicine

## 2016-07-23 DIAGNOSIS — Z79899 Other long term (current) drug therapy: Secondary | ICD-10-CM

## 2016-07-23 DIAGNOSIS — N170 Acute kidney failure with tubular necrosis: Secondary | ICD-10-CM | POA: Diagnosis present

## 2016-07-23 DIAGNOSIS — L97819 Non-pressure chronic ulcer of other part of right lower leg with unspecified severity: Secondary | ICD-10-CM | POA: Diagnosis present

## 2016-07-23 DIAGNOSIS — Z87891 Personal history of nicotine dependence: Secondary | ICD-10-CM

## 2016-07-23 DIAGNOSIS — E860 Dehydration: Secondary | ICD-10-CM | POA: Diagnosis not present

## 2016-07-23 DIAGNOSIS — E870 Hyperosmolality and hypernatremia: Secondary | ICD-10-CM | POA: Diagnosis not present

## 2016-07-23 DIAGNOSIS — M6281 Muscle weakness (generalized): Secondary | ICD-10-CM

## 2016-07-23 DIAGNOSIS — I248 Other forms of acute ischemic heart disease: Secondary | ICD-10-CM | POA: Diagnosis present

## 2016-07-23 DIAGNOSIS — J45909 Unspecified asthma, uncomplicated: Secondary | ICD-10-CM | POA: Diagnosis present

## 2016-07-23 DIAGNOSIS — Z7902 Long term (current) use of antithrombotics/antiplatelets: Secondary | ICD-10-CM

## 2016-07-23 DIAGNOSIS — R6520 Severe sepsis without septic shock: Secondary | ICD-10-CM | POA: Diagnosis present

## 2016-07-23 DIAGNOSIS — Z881 Allergy status to other antibiotic agents status: Secondary | ICD-10-CM

## 2016-07-23 DIAGNOSIS — N183 Chronic kidney disease, stage 3 (moderate): Secondary | ICD-10-CM | POA: Diagnosis present

## 2016-07-23 DIAGNOSIS — R54 Age-related physical debility: Secondary | ICD-10-CM | POA: Diagnosis present

## 2016-07-23 DIAGNOSIS — K5900 Constipation, unspecified: Secondary | ICD-10-CM | POA: Diagnosis present

## 2016-07-23 DIAGNOSIS — L8990 Pressure ulcer of unspecified site, unspecified stage: Secondary | ICD-10-CM | POA: Insufficient documentation

## 2016-07-23 DIAGNOSIS — E872 Acidosis: Secondary | ICD-10-CM | POA: Diagnosis present

## 2016-07-23 DIAGNOSIS — I129 Hypertensive chronic kidney disease with stage 1 through stage 4 chronic kidney disease, or unspecified chronic kidney disease: Secondary | ICD-10-CM | POA: Diagnosis present

## 2016-07-23 DIAGNOSIS — I70261 Atherosclerosis of native arteries of extremities with gangrene, right leg: Secondary | ICD-10-CM | POA: Diagnosis present

## 2016-07-23 DIAGNOSIS — D473 Essential (hemorrhagic) thrombocythemia: Secondary | ICD-10-CM

## 2016-07-23 DIAGNOSIS — K219 Gastro-esophageal reflux disease without esophagitis: Secondary | ICD-10-CM | POA: Diagnosis present

## 2016-07-23 DIAGNOSIS — E875 Hyperkalemia: Secondary | ICD-10-CM | POA: Diagnosis not present

## 2016-07-23 DIAGNOSIS — A419 Sepsis, unspecified organism: Secondary | ICD-10-CM

## 2016-07-23 DIAGNOSIS — Z7901 Long term (current) use of anticoagulants: Secondary | ICD-10-CM

## 2016-07-23 DIAGNOSIS — A4181 Sepsis due to Enterococcus: Principal | ICD-10-CM | POA: Diagnosis present

## 2016-07-23 DIAGNOSIS — R339 Retention of urine, unspecified: Secondary | ICD-10-CM | POA: Diagnosis present

## 2016-07-23 DIAGNOSIS — Z515 Encounter for palliative care: Secondary | ICD-10-CM | POA: Diagnosis not present

## 2016-07-23 DIAGNOSIS — R64 Cachexia: Secondary | ICD-10-CM | POA: Diagnosis present

## 2016-07-23 DIAGNOSIS — Z6823 Body mass index (BMI) 23.0-23.9, adult: Secondary | ICD-10-CM

## 2016-07-23 DIAGNOSIS — E785 Hyperlipidemia, unspecified: Secondary | ICD-10-CM | POA: Diagnosis present

## 2016-07-23 DIAGNOSIS — Z66 Do not resuscitate: Secondary | ICD-10-CM | POA: Diagnosis not present

## 2016-07-23 DIAGNOSIS — R410 Disorientation, unspecified: Secondary | ICD-10-CM | POA: Diagnosis present

## 2016-07-23 DIAGNOSIS — Z7951 Long term (current) use of inhaled steroids: Secondary | ICD-10-CM

## 2016-07-23 DIAGNOSIS — L97919 Non-pressure chronic ulcer of unspecified part of right lower leg with unspecified severity: Secondary | ICD-10-CM

## 2016-07-23 DIAGNOSIS — Z8249 Family history of ischemic heart disease and other diseases of the circulatory system: Secondary | ICD-10-CM

## 2016-07-23 DIAGNOSIS — I739 Peripheral vascular disease, unspecified: Secondary | ICD-10-CM

## 2016-07-23 DIAGNOSIS — E876 Hypokalemia: Secondary | ICD-10-CM | POA: Diagnosis present

## 2016-07-23 DIAGNOSIS — Z823 Family history of stroke: Secondary | ICD-10-CM

## 2016-07-23 DIAGNOSIS — N179 Acute kidney failure, unspecified: Secondary | ICD-10-CM

## 2016-07-23 DIAGNOSIS — T40605A Adverse effect of unspecified narcotics, initial encounter: Secondary | ICD-10-CM | POA: Diagnosis present

## 2016-07-23 DIAGNOSIS — Z7982 Long term (current) use of aspirin: Secondary | ICD-10-CM

## 2016-07-23 DIAGNOSIS — Z7189 Other specified counseling: Secondary | ICD-10-CM

## 2016-07-23 LAB — CBC WITH DIFFERENTIAL/PLATELET
BASOS ABS: 0.1 10*3/uL (ref 0–0.1)
BASOS PCT: 0 %
EOS ABS: 0.1 10*3/uL (ref 0–0.7)
EOS PCT: 0 %
HCT: 34.7 % — ABNORMAL LOW (ref 40.0–52.0)
Hemoglobin: 11.9 g/dL — ABNORMAL LOW (ref 13.0–18.0)
LYMPHS PCT: 4 %
Lymphs Abs: 0.8 10*3/uL — ABNORMAL LOW (ref 1.0–3.6)
MCH: 30.9 pg (ref 26.0–34.0)
MCHC: 34.3 g/dL (ref 32.0–36.0)
MCV: 90 fL (ref 80.0–100.0)
MONO ABS: 1.3 10*3/uL — AB (ref 0.2–1.0)
Monocytes Relative: 5 %
Neutro Abs: 21.6 10*3/uL — ABNORMAL HIGH (ref 1.4–6.5)
Neutrophils Relative %: 91 %
PLATELETS: 1457 10*3/uL — AB (ref 150–440)
RBC: 3.86 MIL/uL — ABNORMAL LOW (ref 4.40–5.90)
RDW: 15.5 % — AB (ref 11.5–14.5)
WBC: 23.9 10*3/uL — ABNORMAL HIGH (ref 3.8–10.6)

## 2016-07-23 LAB — COMPREHENSIVE METABOLIC PANEL
ALK PHOS: 82 U/L (ref 38–126)
ALT: 29 U/L (ref 17–63)
ANION GAP: 22 — AB (ref 5–15)
AST: 71 U/L — ABNORMAL HIGH (ref 15–41)
Albumin: 3.3 g/dL — ABNORMAL LOW (ref 3.5–5.0)
BILIRUBIN TOTAL: 0.4 mg/dL (ref 0.3–1.2)
BUN: 146 mg/dL — ABNORMAL HIGH (ref 6–20)
CALCIUM: 8.3 mg/dL — AB (ref 8.9–10.3)
CO2: 24 mmol/L (ref 22–32)
Chloride: 86 mmol/L — ABNORMAL LOW (ref 101–111)
Creatinine, Ser: 7.82 mg/dL — ABNORMAL HIGH (ref 0.61–1.24)
GFR calc non Af Amer: 5 mL/min — ABNORMAL LOW (ref >60)
GFR, EST AFRICAN AMERICAN: 6 mL/min — AB (ref >60)
Glucose, Bld: 144 mg/dL — ABNORMAL HIGH (ref 65–99)
Potassium: 3.7 mmol/L (ref 3.5–5.1)
Sodium: 132 mmol/L — ABNORMAL LOW (ref 135–145)
TOTAL PROTEIN: 7.2 g/dL (ref 6.5–8.1)

## 2016-07-23 LAB — LACTIC ACID, PLASMA
LACTIC ACID, VENOUS: 3 mmol/L — AB (ref 0.5–1.9)
LACTIC ACID, VENOUS: 2.3 mmol/L — AB (ref 0.5–1.9)

## 2016-07-23 LAB — TROPONIN I: TROPONIN I: 0.08 ng/mL — AB (ref <0.03)

## 2016-07-23 MED ORDER — PIPERACILLIN-TAZOBACTAM 3.375 G IVPB
INTRAVENOUS | Status: AC
  Administered 2016-07-23: 3.375 g via INTRAVENOUS
  Filled 2016-07-23: qty 50

## 2016-07-23 MED ORDER — VANCOMYCIN HCL IN DEXTROSE 1-5 GM/200ML-% IV SOLN
1000.0000 mg | Freq: Once | INTRAVENOUS | Status: AC
  Administered 2016-07-23: 1000 mg via INTRAVENOUS

## 2016-07-23 MED ORDER — SODIUM CHLORIDE 0.9 % IV BOLUS (SEPSIS)
1000.0000 mL | Freq: Once | INTRAVENOUS | Status: AC
  Administered 2016-07-23: 1000 mL via INTRAVENOUS

## 2016-07-23 MED ORDER — VANCOMYCIN HCL IN DEXTROSE 1-5 GM/200ML-% IV SOLN
INTRAVENOUS | Status: AC
  Administered 2016-07-23: 1000 mg via INTRAVENOUS
  Filled 2016-07-23: qty 200

## 2016-07-23 MED ORDER — PIPERACILLIN-TAZOBACTAM 3.375 G IVPB 30 MIN
3.3750 g | Freq: Once | INTRAVENOUS | Status: AC
  Administered 2016-07-23: 3.375 g via INTRAVENOUS

## 2016-07-23 MED ORDER — MORPHINE SULFATE (PF) 4 MG/ML IV SOLN
4.0000 mg | Freq: Once | INTRAVENOUS | Status: AC
  Administered 2016-07-24: 4 mg via INTRAVENOUS
  Filled 2016-07-23 (×2): qty 1

## 2016-07-23 MED ORDER — SODIUM CHLORIDE 0.9 % IV SOLN
Freq: Once | INTRAVENOUS | Status: AC
  Administered 2016-07-23: 18:00:00 via INTRAVENOUS

## 2016-07-23 MED ORDER — ONDANSETRON HCL 4 MG/2ML IJ SOLN
4.0000 mg | Freq: Once | INTRAMUSCULAR | Status: AC
Start: 2016-07-23 — End: 2016-07-24
  Administered 2016-07-24: 4 mg via INTRAVENOUS
  Filled 2016-07-23 (×2): qty 2

## 2016-07-23 NOTE — ED Triage Notes (Signed)
Pt presents from [facility] via [facility] for elevated BUN. BUN was 127. Pt arrives with a butterfly needle in LLQ, with tegaderm over it with the date 07/21/16. Pt was fluids from [facility] at 61ml/hr. Pt denies CP, SOB, N&V, D. NKA, EMS VS 96% RA, 96 HR, BP 114/47.

## 2016-07-23 NOTE — ED Notes (Signed)
Pt LLQ butterfly de-accessed. Gauze and tape placed over site where needle was. Needle disposed of. No bleeding noted.

## 2016-07-23 NOTE — ED Notes (Signed)
Pt oxygen dropped for sustained amount of time into 70's. Placed on 2 L nasal cannula. Came up to 100%. Will continue to monitor.

## 2016-07-23 NOTE — ED Notes (Signed)
Pt taken to US via stretcher

## 2016-07-23 NOTE — H&P (Addendum)
History and Physical   [facility] - [place] @ [facility] Admission History and Physical [employer], D.O.    Patient Name: Andrew [name] MR#: [id number] Date of Birth: Aug 20, 1922 Date of Admission: 07/23/2016  Referring MD/NP/PA: Dr. Cinda Quest Primary Care Physician: Tama High III, MD Patient coming from: Home  Chief Complaint: Sent by PMD  HPI: Andrew [name] is a 84 y.o. male with a known history of asthma, CKD, HTN, PVD/PAD was in a usual state of health until his PMD at [facility] sent him to the ED for acute renal failure.  He is complaining only of right leg pain with ulcer. He has been under the care of vascular surgery for peripheral arterial disease with ulcer. Patient does not have any family but his neighbors have been involved in his care and have been visiting him frequently. They stated that he has had constipation for several days secondary to opiate pain medications however he did have a normal bowel movement yesterday. They deny having heard any urinary complaints.   Otherwise there has been no change in status. Patient has been taking medication as prescribed and there has been no recent change in medication or diet.  No recent antibiotics.  There has been no recent illness, hospitalizations, travel or sick contacts.    Patient denies fevers/chills, weakness, dizziness, chest pain, shortness of breath, N/V/C/D, abdominal pain, dysuria/frequency, changes in mental status.   Review of Systems:  CONSTITUTIONAL: No fever/chills, fatigue, weakness, weight gain/loss, headache. EYES: No blurry or double vision. ENT: No tinnitus, postnasal drip, redness or soreness of the oropharynx. RESPIRATORY: No cough, dyspnea, wheeze.  No hemoptysis.  CARDIOVASCULAR: No chest pain, palpitations, syncope, orthopnea. No lower extremity edema.  GASTROINTESTINAL: No nausea, vomiting, abdominal pain, diarrhea, constipation.  No hematemesis, melena or hematochezia. GENITOURINARY:  No dysuria, frequency, hematuria. ENDOCRINE: No polyuria or nocturia. No heat or cold intolerance. HEMATOLOGY: No anemia, bruising, bleeding. INTEGUMENTARY: No rashes, ulcers, lesions. MUSCULOSKELETAL: No arthritis, gout, dyspnea. Positive right leg pain NEUROLOGIC: No numbness, tingling, ataxia, seizure-type activity, weakness. PSYCHIATRIC: No anxiety, depression, insomnia.   Past Medical History:  Diagnosis Date  . Asthma   . Chronic kidney disease   . Hypertension   . Peripheral vascular disease [facility])     Past Surgical History:  Procedure Laterality Date  . PERIPHERAL VASCULAR CATHETERIZATION Right 02/07/2016   Procedure: Lower Extremity Angiography;  Surgeon: Algernon Huxley, MD;  Location: [place] [facility];  Service: Cardiovascular;  Laterality: Right;  . PERIPHERAL VASCULAR CATHETERIZATION Right 03/27/2016   Procedure: Lower Extremity Angiography;  Surgeon: Algernon Huxley, MD;  Location: [place] [facility];  Service: Cardiovascular;  Laterality: Right;  . PERIPHERAL VASCULAR CATHETERIZATION  03/27/2016   Procedure: Lower Extremity Intervention;  Surgeon: Algernon Huxley, MD;  Location: [place] [facility];  Service: Cardiovascular;;  . PERIPHERAL VASCULAR CATHETERIZATION Right 03/28/2016   Procedure: Lower Extremity Angiography;  Surgeon: Algernon Huxley, MD;  Location: [place] [facility];  Service: Cardiovascular;  Laterality: Right;  . PERIPHERAL VASCULAR CATHETERIZATION Right 06/01/2016   Procedure: Lower Extremity Angiography;  Surgeon: Algernon Huxley, MD;  Location: [place] [facility];  Service: Cardiovascular;  Laterality: Right;  . PERIPHERAL VASCULAR CATHETERIZATION  06/01/2016   Procedure: Lower Extremity Intervention;  Surgeon: Algernon Huxley, MD;  Location: [place] [facility];  Service: Cardiovascular;;  . SHOULDER SURGERY  1974  . UPPER GI ENDOSCOPY       reports that he has quit smoking. He quit  smokeless tobacco use about 23 years ago. He reports that he does not drink  alcohol or use drugs.  Allergies  Allergen Reactions  . Keflex [Cephalexin] Other (See Comments)    Patient states this medication made his legs hurt really bad.    Family History  Problem Relation Age of Onset  . Heart failure Mother   . Stroke Father    Prior to Admission medications   Medication Sig Start Date End Date Taking? Authorizing Provider  albuterol (PROVENTIL) (2.5 MG/3ML) 0.083% nebulizer solution Take 2.5 mg by nebulization 2 (two) times daily.   Yes Historical Provider, MD  apixaban (ELIQUIS) 2.5 MG TABS tablet Take 1 tablet (2.5 mg total) by mouth 2 (two) times daily. 06/06/16  Yes Algernon Huxley, MD  aspirin EC 81 MG tablet Take 1 tablet (81 mg total) by mouth daily. 02/07/16  Yes Algernon Huxley, MD  atorvastatin (LIPITOR) 20 MG tablet Take 1 tablet (20 mg total) by mouth daily. Patient taking differently: Take 20 mg by mouth every evening.  02/07/16  Yes Algernon Huxley, MD  Cholecalciferol 1000 units capsule Take 1,000 Units by mouth every morning.   Yes Historical Provider, MD  clopidogrel (PLAVIX) 75 MG tablet Take 75 mg by mouth daily.  04/06/16  Yes Historical Provider, MD  Cyanocobalamin (VITAMIN B-12) 2500 MCG SUBL Place 2,500 mcg under the tongue every morning.   Yes Historical Provider, MD  fexofenadine (ALLEGRA) 180 MG tablet Take 180 mg by mouth daily.   Yes Historical Provider, MD  fluticasone (VERAMYST) 27.5 MCG/SPRAY nasal spray Place 2 sprays into the nose daily.   Yes Historical Provider, MD  Fluticasone-Salmeterol (ADVAIR) 250-50 MCG/DOSE AEPB Inhale 1 puff into the lungs 2 (two) times daily.   Yes Historical Provider, MD  hydrochlorothiazide (MICROZIDE) 12.5 MG capsule Take 12.5 mg by mouth every morning.    Yes Historical Provider, MD  levothyroxine (SYNTHROID, LEVOTHROID) 75 MCG tablet Take 75 mcg by mouth daily before breakfast.   Yes Historical Provider, MD  losartan (COZAAR) 25 MG tablet Take 25 mg by mouth every morning. 03/09/16  Yes Historical Provider, MD   Multiple Vitamins-Minerals (PRESERVISION/LUTEIN) CAPS Take 1 capsule by mouth 2 (two) times daily.   Yes Historical Provider, MD  omeprazole (PRILOSEC) 20 MG capsule Take 20 mg by mouth daily before breakfast.    Yes Historical Provider, MD  polyvinyl alcohol-povidone (HYPOTEARS) 1.4-0.6 % ophthalmic solution Place 1-2 drops into both eyes daily.   Yes Historical Provider, MD  Tetrahydroz-Glyc-Hyprom-PEG (VISINE MAXIMUM REDNESS RELIEF) 0.05-0.2-0.36-1 % SOLN Place 1 drop into both eyes daily.   Yes Historical Provider, MD  tiotropium (SPIRIVA HANDIHALER) 18 MCG inhalation capsule Place 18 mcg into inhaler and inhale daily.   Yes Historical Provider, MD  traMADol (ULTRAM) 50 MG tablet Take 1-2 tablets (50-100 mg total) by mouth 3 (three) times daily as needed (for pain.). 06/06/16  Yes Algernon Huxley, MD  vitamin E 400 UNIT capsule Take 400 Units by mouth every morning.    Yes Historical Provider, MD    Physical Exam: Vitals:   07/23/16 2142 07/23/16 2200 07/23/16 2230 07/23/16 2300  BP: (!) 112/44 (!) 96/51 (!) 101/50 (!) 93/43  Pulse: (!) 105 (!) 103  94  Resp: 18 (!) 34 (!) 21 18  Temp:      TempSrc:      SpO2: 100% 99%  94%  Weight:        GENERAL: 83 y.o.-year-old Chronically ill-appearing white male patient, well-developed, well-nourished  lying in the bed in no acute distress.  Pleasant and cooperative.   HEENT: Head atraumatic, normocephalic. Pupils equal, round, reactive to light and accommodation. No scleral icterus. Extraocular muscles intact. Nares are patent. Oropharynx is clear. Mucus membranes moist. NECK: Supple, full range of motion. No JVD, no bruit heard. No thyroid enlargement, no tenderness, no cervical lymphadenopathy. CHEST: Normal breath sounds bilaterally. No wheezing, rales, rhonchi or crackles. No use of accessory muscles of respiration.  No reproducible chest wall tenderness.  CARDIOVASCULAR: S1, S2 normal. No murmurs, rubs, or gallops. Cap refill <2 seconds. Pulses  intact distally.  ABDOMEN: Soft, nondistended, nontender. No rebound, guarding, rigidity. Normoactive bowel sounds present in all four quadrants. No organomegaly or mass. EXTREMITIES: No pedal edema, cyanosis, or clubbing. No calf tenderness or Homan's sign.  NEUROLOGIC: The patient is alert and oriented x 3. Cranial nerves II through XII are grossly intact with no focal sensorimotor deficit. Muscle strength 5/5 in all extremities. Sensation intact. Gait not checked. PSYCHIATRIC:  Normal affect, mood, thought content. SKIN: There is warmth, redness and edema of the bilateral lower extremities in the mid calf down. His midfoot through the toes are purplish in color with chronic appearing changes including eschar formation on the third toe on the right. He has diminished pulses on the left and absent pulses on the right and his right foot is cool to touch.    Labs on Admission:  CBC:  Recent Labs Lab 07/23/16 1731  WBC 23.9*  NEUTROABS 21.6*  HGB 11.9*  HCT 34.7*  MCV 90.0  PLT 99991111*   Basic Metabolic Panel:  Recent Labs Lab 07/23/16 1731  NA 132*  K 3.7  CL 86*  CO2 24  GLUCOSE 144*  BUN 146*  CREATININE 7.82*  CALCIUM 8.3*   GFR: Estimated Creatinine Clearance: 5.8 mL/min (by C-G formula based on SCr of 7.82 mg/dL (H)). Liver Function Tests:  Recent Labs Lab 07/23/16 1731  AST 71*  ALT 29  ALKPHOS 82  BILITOT 0.4  PROT 7.2  ALBUMIN 3.3*   No results for input(s): LIPASE, AMYLASE in the last 168 hours. No results for input(s): AMMONIA in the last 168 hours. Coagulation Profile: No results for input(s): INR, PROTIME in the last 168 hours. Cardiac Enzymes:  Recent Labs Lab 07/23/16 1731  TROPONINI 0.08*   BNP (last 3 results) No results for input(s): PROBNP in the last 8760 hours. HbA1C: No results for input(s): HGBA1C in the last 72 hours. CBG: No results for input(s): GLUCAP in the last 168 hours. Lipid Profile: No results for input(s): CHOL, HDL,  LDLCALC, TRIG, CHOLHDL, LDLDIRECT in the last 72 hours. Thyroid Function Tests: No results for input(s): TSH, T4TOTAL, FREET4, T3FREE, THYROIDAB in the last 72 hours. Anemia Panel: No results for input(s): VITAMINB12, FOLATE, FERRITIN, TIBC, IRON, RETICCTPCT in the last 72 hours. Urine analysis:    Component Value Date/Time   COLORURINE YELLOW (A) 03/27/2016 1157   APPEARANCEUR CLEAR (A) 03/27/2016 1157   LABSPEC 1.013 03/27/2016 1157   PHURINE 6.0 03/27/2016 1157   GLUCOSEU NEGATIVE 03/27/2016 1157   HGBUR NEGATIVE 03/27/2016 1157   BILIRUBINUR NEGATIVE 03/27/2016 1157   KETONESUR NEGATIVE 03/27/2016 1157   PROTEINUR 100 (A) 03/27/2016 1157   NITRITE NEGATIVE 03/27/2016 1157   LEUKOCYTESUR NEGATIVE 03/27/2016 1157   Sepsis Labs: @LABRCNTIP (procalcitonin:4,lacticidven:4) )No results found for this or any previous visit (from the past 240 hour(s)).   Radiological Exams on Admission: US Renal  Result Date: 07/23/2016 CLINICAL DATA:  83 y/o  M; acute renal failure. EXAM: RENAL / URINARY TRACT ULTRASOUND COMPLETE COMPARISON:  04/21/2004 CT of abdomen and pelvis. FINDINGS: Right Kidney: Length: 9.9 cm. Mildly increased echogenicity when compared with the liver parenchyma. Upper pole simple appearing cyst measuring up to 1.4 cm. Left Kidney: Length: 9.9 cm. Simple appearing cysts within the mid kidney measuring 2.9 in 2.3 cm. Mildly increased echogenicity of the renal parenchyma when compared to the spleen. Bladder: Appears normal for degree of bladder distention. IMPRESSION: Mild increase in kidney echogenicity bilaterally is compatible with medical renal disease. No hydronephrosis. Electronically Signed   By: Kristine Garbe M.D.   On: 07/23/2016 22:18   Dg Chest Portable 1 View  Result Date: 07/23/2016 CLINICAL DATA:  Weakness.  Acute renal injury. EXAM: PORTABLE CHEST 1 VIEW COMPARISON:  03/27/2016 FINDINGS: Heart size remains stable. Mild bilateral scarring again noted. No  evidence of pulmonary infiltrate or edema. No evidence of pneumothorax or pleural effusion. IMPRESSION: No active disease. Electronically Signed   By: Earle Gell M.D.   On: 07/23/2016 18:36    EKG: Sinus tachycardia at 100 bpm with normal axis and nonspecific ST-T wave changes.   Assessment/Plan  This is a 83 y.o. male with a history of asthma, CKD, HTN, PVD/PAD  now being admitted with: 1. Sepsis, possibly secondary to cellulitis, lower extremity wounds -Admit to general medical floor -IV Zosyn and Vanco -Follow-up blood, urine, wound cultures -Follow lactate -IV fluid hydration -Wound consult -Pain control 2. AKI -IV fluid hydration and repeat BMP in a.m. -Hold nephrotoxic medications (Cozaar, HCTZ) -Renal ultrasound showed medical renal disease -Monitor Urine Output Pl., Foley catheter evidence of urinary retention - Nephrology consultation 3. Thrombocytosis, acute on chronic possibly reactive -Repeat CBC in a.m. and consider hematology evaluation if necessary 4. Elevated troponin -Likely secondary to demand ischemia We'll follow on telemetry and trend troponins 5. PAD -I did discuss with the vascular surgeon on call who indicated that she would see the patient in the morning. Patient's peripheral arterial disease is chronic in nature. At his last visit amputation was discussed if pain or wound are worsening. -Continue Eliquis and Plavix - Check arterial doppler 6. History of hyperlipidemia -Continue Lipitor 7. History of asthma and allergies -Continue Allegra, fluticasone, Advair, Spiriva 8. History of GERD-continue Prilosec 9. History of hypertension -Hold Cozaar and HCTZ  Admission status: Inpatient, telemetry IV Fluids: NS Diet/Nutrition: NPO Consults called: ID, Vascular surgery, nephrology DVT Px: Eliquis, SCDs and early ambulation. Code Status: Full Code  Disposition Plan: To SNF in 2-3 days   All the records are reviewed and case discussed with ED provider.  Management plans discussed with the patient and/or family who express understanding and agree with plan of care.  Pepper Kerrick D.O. on 07/23/2016 at 11:17 PM Between 7am to 6pm - Pager - [phone] After 6pm go to [email or web address] - [employer] [facility] [place] [facility] Office [phone] CC: Primary care physician; Tama High III, MD   07/23/2016, 11:17 PM    \

## 2016-07-23 NOTE — ED Notes (Signed)
Pt stated he needed to have a BM and wanted to sit on side of bed.   Dr. Cinda Quest gave pt some food and drink.

## 2016-07-23 NOTE — ED Provider Notes (Addendum)
[facility] Emergency Department Provider Note   ____________________________________________   First MD Initiated Contact with Patient 07/23/16 1720     (approximate)  I have reviewed the triage vital signs and the nursing notes.   HISTORY  Chief Complaint Abnormal Lab    HPI Andrew [name] is a 83 y.o. male patient [facility] for elevated BUN of 127. Patient has a butterfly in his left lower quadrant the surrounding area and felt most of the abdomen is read somewhat swollen warm and tender. It looks cellulitic. Patient said he was constipated for 5 days but last night had a big bowel movement and feels better now.  Past Medical History:  Diagnosis Date  . Asthma   . Chronic kidney disease   . Hypertension   . Peripheral vascular disease [facility])     Patient Active Problem List   Diagnosis Date Noted  . Peripheral vascular disease ([place]) 06/01/2016  . Lower limb ulcer, ankle, right, limited to breakdown of skin ([place]) 04/14/2016  . Hyperlipidemia 04/14/2016  . Essential hypertension 04/14/2016  . Atherosclerosis of native artery of right lower extremity with ulceration of ankle ([place]) 04/14/2016  . Swelling of limb 04/14/2016  . Cellulitis 03/27/2016  . Cold foot with peripheral vascular disease ([place]) 03/27/2016  . Chronic venous hypertension with ulcer ([place]) 01/13/2016    Past Surgical History:  Procedure Laterality Date  . PERIPHERAL VASCULAR CATHETERIZATION Right 02/07/2016   Procedure: Lower Extremity Angiography;  Surgeon: Algernon Huxley, MD;  Location: [place] [facility];  Service: Cardiovascular;  Laterality: Right;  . PERIPHERAL VASCULAR CATHETERIZATION Right 03/27/2016   Procedure: Lower Extremity Angiography;  Surgeon: Algernon Huxley, MD;  Location: [place] [facility];  Service: Cardiovascular;  Laterality: Right;  . PERIPHERAL VASCULAR CATHETERIZATION  03/27/2016   Procedure: Lower Extremity Intervention;  Surgeon: Algernon Huxley, MD;  Location: [place] [facility];  Service: Cardiovascular;;  . PERIPHERAL VASCULAR CATHETERIZATION Right 03/28/2016   Procedure: Lower Extremity Angiography;  Surgeon: Algernon Huxley, MD;  Location: [place] [facility];  Service: Cardiovascular;  Laterality: Right;  . PERIPHERAL VASCULAR CATHETERIZATION Right 06/01/2016   Procedure: Lower Extremity Angiography;  Surgeon: Algernon Huxley, MD;  Location: [place] [facility];  Service: Cardiovascular;  Laterality: Right;  . PERIPHERAL VASCULAR CATHETERIZATION  06/01/2016   Procedure: Lower Extremity Intervention;  Surgeon: Algernon Huxley, MD;  Location: [place] [facility];  Service: Cardiovascular;;  . SHOULDER SURGERY  1974  . UPPER GI ENDOSCOPY      Prior to Admission medications   Medication Sig Start Date End Date Taking? Authorizing Provider  albuterol (PROVENTIL) (2.5 MG/3ML) 0.083% nebulizer solution Take 2.5 mg by nebulization 2 (two) times daily.   Yes Historical Provider, MD  apixaban (ELIQUIS) 2.5 MG TABS tablet Take 1 tablet (2.5 mg total) by mouth 2 (two) times daily. 06/06/16  Yes Algernon Huxley, MD  aspirin EC 81 MG tablet Take 1 tablet (81 mg total) by mouth daily. 02/07/16  Yes Algernon Huxley, MD  atorvastatin (LIPITOR) 20 MG tablet Take 1 tablet (20 mg total) by mouth daily. Patient taking differently: Take 20 mg by mouth every evening.  02/07/16  Yes Algernon Huxley, MD  Cholecalciferol 1000 units capsule Take 1,000 Units by mouth every morning.   Yes Historical Provider, MD  clopidogrel (PLAVIX) 75 MG tablet Take 75 mg by mouth daily.  04/06/16  Yes Historical Provider, MD  Cyanocobalamin (VITAMIN B-12) 2500  MCG SUBL Place 2,500 mcg under the tongue every morning.   Yes Historical Provider, MD  fexofenadine (ALLEGRA) 180 MG tablet Take 180 mg by mouth daily.   Yes Historical Provider, MD  fluticasone (VERAMYST) 27.5 MCG/SPRAY nasal spray Place 2 sprays into the nose daily.   Yes Historical Provider, MD  Fluticasone-Salmeterol (ADVAIR)  250-50 MCG/DOSE AEPB Inhale 1 puff into the lungs 2 (two) times daily.   Yes Historical Provider, MD  hydrochlorothiazide (MICROZIDE) 12.5 MG capsule Take 12.5 mg by mouth every morning.    Yes Historical Provider, MD  levothyroxine (SYNTHROID, LEVOTHROID) 75 MCG tablet Take 75 mcg by mouth daily before breakfast.   Yes Historical Provider, MD  losartan (COZAAR) 25 MG tablet Take 25 mg by mouth every morning. 03/09/16  Yes Historical Provider, MD  Multiple Vitamins-Minerals (PRESERVISION/LUTEIN) CAPS Take 1 capsule by mouth 2 (two) times daily.   Yes Historical Provider, MD  omeprazole (PRILOSEC) 20 MG capsule Take 20 mg by mouth daily before breakfast.    Yes Historical Provider, MD  polyvinyl alcohol-povidone (HYPOTEARS) 1.4-0.6 % ophthalmic solution Place 1-2 drops into both eyes daily.   Yes Historical Provider, MD  Tetrahydroz-Glyc-Hyprom-PEG (VISINE MAXIMUM REDNESS RELIEF) 0.05-0.2-0.36-1 % SOLN Place 1 drop into both eyes daily.   Yes Historical Provider, MD  tiotropium (SPIRIVA HANDIHALER) 18 MCG inhalation capsule Place 18 mcg into inhaler and inhale daily.   Yes Historical Provider, MD  traMADol (ULTRAM) 50 MG tablet Take 1-2 tablets (50-100 mg total) by mouth 3 (three) times daily as needed (for pain.). 06/06/16  Yes Algernon Huxley, MD  vitamin E 400 UNIT capsule Take 400 Units by mouth every morning.    Yes Historical Provider, MD    Allergies Keflex [cephalexin]  Family History  Problem Relation Age of Onset  . Heart failure Mother   . Stroke Father     Social History Social History  Substance Use Topics  . Smoking status: Former Research scientist (life sciences)  . Smokeless tobacco: Former Systems developer    Quit date: 05/16/1993  . Alcohol use No    Review of Systems Constitutional: No fever/chills Eyes: No visual changes. ENT: No sore throat. Cardiovascular: Denies chest pain. Respiratory: Denies shortness of breath. Gastrointestinal: See history of present illness Genitourinary: Negative for  dysuria. Musculoskeletal: Negative for back pain. Skin: See history of present illness.  10-point ROS otherwise negative.  ____________________________________________   PHYSICAL EXAM:  VITAL SIGNS: ED Triage Vitals  Enc Vitals Group     BP 07/23/16 1725 (!) 100/55     Pulse Rate 07/23/16 1725 95     Resp 07/23/16 1725 19     Temp 07/23/16 1725 98.2 F (36.8 C)     Temp Source 07/23/16 1725 Oral     SpO2 07/23/16 1725 95 %     Weight 07/23/16 1725 161 lb 11.2 oz (73.3 kg)     Height --      Head Circumference --      Peak Flow --      Pain Score 07/23/16 1726 0     Pain Loc --      Pain Edu? --      Excl. in [place]? --     Constitutional: Alert and oriented. Well appearing and in no acute distress. Eyes: Conjunctivae are normal. PERRL. EOMI. Head: Atraumatic. Nose: No congestion/rhinnorhea. Mouth/Throat: Mucous membranes are moist.  Oropharynx non-erythematous. Neck: No stridor.  Cardiovascular: Normal rate, regular rhythm. Grossly normal heart sounds.  Good peripheral circulation. Respiratory: Normal respiratory effort.  No retractions. Lungs CTAB. Gastrointestinal: See description of abdominal wall in history of present illness. The belly is not tender except for in the areas of the apparent cellulitis. No distention. No abdominal bruits. No CVA tenderness. Musculoskeletal: Both lower extremities are wrapped up below the knee. We will undo the wrapping shortly and examined him.   ____________________________________________   LABS (all labs ordered are listed, but only abnormal results are displayed)  Labs Reviewed  COMPREHENSIVE METABOLIC PANEL - Abnormal; Notable for the following:       Result Value   Sodium 132 (*)    Chloride 86 (*)    Glucose, Bld 144 (*)    BUN 146 (*)    Creatinine, Ser 7.82 (*)    Calcium 8.3 (*)    Albumin 3.3 (*)    AST 71 (*)    GFR calc non Af Amer 5 (*)    GFR calc Af Amer 6 (*)    Anion gap 22 (*)    All other components  within normal limits  TROPONIN I - Abnormal; Notable for the following:    Troponin I 0.08 (*)    All other components within normal limits  LACTIC ACID, PLASMA - Abnormal; Notable for the following:    Lactic Acid, Venous 3.0 (*)    All other components within normal limits  CBC WITH DIFFERENTIAL/PLATELET - Abnormal; Notable for the following:    WBC 23.9 (*)    RBC 3.86 (*)    Hemoglobin 11.9 (*)    HCT 34.7 (*)    RDW 15.5 (*)    Platelets 1,457 (*)    Neutro Abs 21.6 (*)    Lymphs Abs 0.8 (*)    Monocytes Absolute 1.3 (*)    All other components within normal limits  CULTURE, BLOOD (ROUTINE X 2)  CULTURE, BLOOD (ROUTINE X 2)  LACTIC ACID, PLASMA  URINALYSIS, COMPLETE (UACMP) WITH MICROSCOPIC   ____________________________________________  EKG  EKG read and interpreted by me shows normal sinus rhythm rate of 100 axis appears to be normal but is very difficult to tell because of baseline is very unclear there are no apparent ST T changes. ____________________________________________  RADIOLOGY  Study Result   CLINICAL DATA:  Weakness.  Acute renal injury.  EXAM: PORTABLE CHEST 1 VIEW  COMPARISON:  03/27/2016  FINDINGS: Heart size remains stable. Mild bilateral scarring again noted. No evidence of pulmonary infiltrate or edema. No evidence of pneumothorax or pleural effusion.  IMPRESSION: No active disease.   Electronically Signed   By: Earle Gell M.D.   On: 07/23/2016 18:36    ____________________________________________   PROCEDURES  Procedure(s) performed:  Procedures  Critical Care performed:   ____________________________________________   INITIAL IMPRESSION / ASSESSMENT AND PLAN / ED COURSE  Pertinent labs & imaging results that were available during my care of the patient were reviewed by me and considered in my medical decision making (see chart for details).        ____________________________________________   FINAL  CLINICAL IMPRESSION(S) / ED DIAGNOSES  Final diagnoses:  Acute renal failure, unspecified acute renal failure type ([place])  Dehydration  Sepsis, due to unspecified organism [facility])      NEW MEDICATIONS STARTED DURING THIS VISIT:  New Prescriptions   No medications on file     Note:  This document was prepared using Dragon voice recognition software and may include unintentional dictation errors.    Nena Polio, MD 07/23/16 [id number]    Nena Polio, MD  07/23/16 2015  

## 2016-07-23 NOTE — ED Notes (Addendum)
Pt states his lower legs are wrapped "because they just run water." Pt mentioned sores. No pressure ulcer noted to buttocks

## 2016-07-23 NOTE — ED Notes (Signed)
Pt had small brown soft BM in depends, cleaned and new depends placed. Pt rolled from side to side for changing.

## 2016-07-23 NOTE — ED Notes (Signed)
Pt stating his legs feel better now that he's sitting on side of bed. Legs are less red and less swollen per family. Pt abdomen appears less red now.

## 2016-07-24 ENCOUNTER — Inpatient Hospital Stay: Admission: EM | Admit: 2016-07-24 | Payer: Medicare Other | Source: Home / Self Care | Admitting: Family Medicine

## 2016-07-24 ENCOUNTER — Inpatient Hospital Stay: Payer: Medicare Other

## 2016-07-24 DIAGNOSIS — K219 Gastro-esophageal reflux disease without esophagitis: Secondary | ICD-10-CM | POA: Diagnosis present

## 2016-07-24 DIAGNOSIS — E870 Hyperosmolality and hypernatremia: Secondary | ICD-10-CM | POA: Diagnosis not present

## 2016-07-24 DIAGNOSIS — R509 Fever, unspecified: Secondary | ICD-10-CM | POA: Diagnosis not present

## 2016-07-24 DIAGNOSIS — L97919 Non-pressure chronic ulcer of unspecified part of right lower leg with unspecified severity: Secondary | ICD-10-CM | POA: Diagnosis not present

## 2016-07-24 DIAGNOSIS — T40605A Adverse effect of unspecified narcotics, initial encounter: Secondary | ICD-10-CM | POA: Diagnosis present

## 2016-07-24 DIAGNOSIS — A4181 Sepsis due to Enterococcus: Secondary | ICD-10-CM | POA: Diagnosis present

## 2016-07-24 DIAGNOSIS — I739 Peripheral vascular disease, unspecified: Secondary | ICD-10-CM | POA: Diagnosis not present

## 2016-07-24 DIAGNOSIS — I70238 Atherosclerosis of native arteries of right leg with ulceration of other part of lower right leg: Secondary | ICD-10-CM | POA: Diagnosis not present

## 2016-07-24 DIAGNOSIS — N179 Acute kidney failure, unspecified: Secondary | ICD-10-CM | POA: Diagnosis not present

## 2016-07-24 DIAGNOSIS — Z7982 Long term (current) use of aspirin: Secondary | ICD-10-CM | POA: Diagnosis not present

## 2016-07-24 DIAGNOSIS — E785 Hyperlipidemia, unspecified: Secondary | ICD-10-CM | POA: Diagnosis present

## 2016-07-24 DIAGNOSIS — N183 Chronic kidney disease, stage 3 (moderate): Secondary | ICD-10-CM | POA: Diagnosis present

## 2016-07-24 DIAGNOSIS — E860 Dehydration: Secondary | ICD-10-CM | POA: Diagnosis present

## 2016-07-24 DIAGNOSIS — E876 Hypokalemia: Secondary | ICD-10-CM | POA: Diagnosis present

## 2016-07-24 DIAGNOSIS — A419 Sepsis, unspecified organism: Secondary | ICD-10-CM | POA: Diagnosis not present

## 2016-07-24 DIAGNOSIS — Z823 Family history of stroke: Secondary | ICD-10-CM | POA: Diagnosis not present

## 2016-07-24 DIAGNOSIS — I70261 Atherosclerosis of native arteries of extremities with gangrene, right leg: Secondary | ICD-10-CM | POA: Diagnosis present

## 2016-07-24 DIAGNOSIS — I129 Hypertensive chronic kidney disease with stage 1 through stage 4 chronic kidney disease, or unspecified chronic kidney disease: Secondary | ICD-10-CM | POA: Diagnosis present

## 2016-07-24 DIAGNOSIS — R339 Retention of urine, unspecified: Secondary | ICD-10-CM | POA: Diagnosis present

## 2016-07-24 DIAGNOSIS — N189 Chronic kidney disease, unspecified: Secondary | ICD-10-CM

## 2016-07-24 DIAGNOSIS — Z7901 Long term (current) use of anticoagulants: Secondary | ICD-10-CM | POA: Diagnosis not present

## 2016-07-24 DIAGNOSIS — N170 Acute kidney failure with tubular necrosis: Secondary | ICD-10-CM | POA: Diagnosis present

## 2016-07-24 DIAGNOSIS — Z79899 Other long term (current) drug therapy: Secondary | ICD-10-CM | POA: Diagnosis not present

## 2016-07-24 DIAGNOSIS — Z7189 Other specified counseling: Secondary | ICD-10-CM | POA: Diagnosis not present

## 2016-07-24 DIAGNOSIS — Z515 Encounter for palliative care: Secondary | ICD-10-CM | POA: Diagnosis not present

## 2016-07-24 DIAGNOSIS — K5900 Constipation, unspecified: Secondary | ICD-10-CM | POA: Diagnosis present

## 2016-07-24 DIAGNOSIS — Z7902 Long term (current) use of antithrombotics/antiplatelets: Secondary | ICD-10-CM | POA: Diagnosis not present

## 2016-07-24 DIAGNOSIS — L8990 Pressure ulcer of unspecified site, unspecified stage: Secondary | ICD-10-CM | POA: Insufficient documentation

## 2016-07-24 DIAGNOSIS — Z8249 Family history of ischemic heart disease and other diseases of the circulatory system: Secondary | ICD-10-CM | POA: Diagnosis not present

## 2016-07-24 DIAGNOSIS — Z881 Allergy status to other antibiotic agents status: Secondary | ICD-10-CM | POA: Diagnosis not present

## 2016-07-24 DIAGNOSIS — R64 Cachexia: Secondary | ICD-10-CM | POA: Diagnosis present

## 2016-07-24 DIAGNOSIS — L97819 Non-pressure chronic ulcer of other part of right lower leg with unspecified severity: Secondary | ICD-10-CM | POA: Diagnosis present

## 2016-07-24 DIAGNOSIS — I248 Other forms of acute ischemic heart disease: Secondary | ICD-10-CM | POA: Diagnosis present

## 2016-07-24 DIAGNOSIS — J45909 Unspecified asthma, uncomplicated: Secondary | ICD-10-CM | POA: Diagnosis present

## 2016-07-24 DIAGNOSIS — E872 Acidosis: Secondary | ICD-10-CM | POA: Diagnosis present

## 2016-07-24 DIAGNOSIS — D473 Essential (hemorrhagic) thrombocythemia: Secondary | ICD-10-CM | POA: Diagnosis not present

## 2016-07-24 LAB — BLOOD CULTURE ID PANEL (REFLEXED)
ACINETOBACTER BAUMANNII: NOT DETECTED
Candida albicans: NOT DETECTED
Candida glabrata: NOT DETECTED
Candida krusei: NOT DETECTED
Candida parapsilosis: NOT DETECTED
Candida tropicalis: NOT DETECTED
ENTEROBACTERIACEAE SPECIES: NOT DETECTED
ENTEROCOCCUS SPECIES: DETECTED — AB
ESCHERICHIA COLI: NOT DETECTED
Enterobacter cloacae complex: NOT DETECTED
HAEMOPHILUS INFLUENZAE: NOT DETECTED
Klebsiella oxytoca: NOT DETECTED
Klebsiella pneumoniae: NOT DETECTED
Listeria monocytogenes: NOT DETECTED
Neisseria meningitidis: NOT DETECTED
PSEUDOMONAS AERUGINOSA: NOT DETECTED
Proteus species: NOT DETECTED
SERRATIA MARCESCENS: NOT DETECTED
STAPHYLOCOCCUS AUREUS BCID: NOT DETECTED
STAPHYLOCOCCUS SPECIES: NOT DETECTED
STREPTOCOCCUS AGALACTIAE: NOT DETECTED
STREPTOCOCCUS PNEUMONIAE: NOT DETECTED
Streptococcus pyogenes: NOT DETECTED
Streptococcus species: NOT DETECTED
VANCOMYCIN RESISTANCE: NOT DETECTED

## 2016-07-24 LAB — CBC
HCT: 30.1 % — ABNORMAL LOW (ref 40.0–52.0)
Hemoglobin: 10.2 g/dL — ABNORMAL LOW (ref 13.0–18.0)
MCH: 30.9 pg (ref 26.0–34.0)
MCHC: 34 g/dL (ref 32.0–36.0)
MCV: 91 fL (ref 80.0–100.0)
PLATELETS: 1256 10*3/uL — AB (ref 150–440)
RBC: 3.31 MIL/uL — ABNORMAL LOW (ref 4.40–5.90)
RDW: 15.3 % — AB (ref 11.5–14.5)
WBC: 19.8 10*3/uL — ABNORMAL HIGH (ref 3.8–10.6)

## 2016-07-24 LAB — VANCOMYCIN, RANDOM: Vancomycin Rm: 13

## 2016-07-24 LAB — BASIC METABOLIC PANEL
Anion gap: 18 — ABNORMAL HIGH (ref 5–15)
BUN: 133 mg/dL — AB (ref 6–20)
CHLORIDE: 93 mmol/L — AB (ref 101–111)
CO2: 24 mmol/L (ref 22–32)
CREATININE: 6.58 mg/dL — AB (ref 0.61–1.24)
Calcium: 7.7 mg/dL — ABNORMAL LOW (ref 8.9–10.3)
GFR calc Af Amer: 8 mL/min — ABNORMAL LOW (ref >60)
GFR calc non Af Amer: 7 mL/min — ABNORMAL LOW (ref >60)
Glucose, Bld: 96 mg/dL (ref 65–99)
Potassium: 3.3 mmol/L — ABNORMAL LOW (ref 3.5–5.1)
Sodium: 135 mmol/L (ref 135–145)

## 2016-07-24 LAB — MRSA PCR SCREENING: MRSA by PCR: POSITIVE — AB

## 2016-07-24 LAB — LACTIC ACID, PLASMA: Lactic Acid, Venous: 1.3 mmol/L (ref 0.5–1.9)

## 2016-07-24 LAB — GLUCOSE, CAPILLARY: Glucose-Capillary: 93 mg/dL (ref 65–99)

## 2016-07-24 MED ORDER — TRAMADOL HCL 50 MG PO TABS: 50.0000 mg | ORAL_TABLET | Freq: Three times a day (TID) | ORAL | Status: DC | PRN

## 2016-07-24 MED ORDER — APIXABAN 2.5 MG PO TABS
2.5000 mg | ORAL_TABLET | Freq: Two times a day (BID) | ORAL | Status: DC
  Administered 2016-07-24 – 2016-07-31 (×12): 2.5 mg via ORAL
  Filled 2016-07-24 (×12): qty 1

## 2016-07-24 MED ORDER — CLOPIDOGREL BISULFATE 75 MG PO TABS
75.0000 mg | ORAL_TABLET | Freq: Every day | ORAL | Status: DC
  Administered 2016-07-24 – 2016-07-31 (×7): 75 mg via ORAL
  Filled 2016-07-24 (×7): qty 1

## 2016-07-24 MED ORDER — CHLORHEXIDINE GLUCONATE CLOTH 2 % EX PADS
6.0000 | MEDICATED_PAD | Freq: Every day | CUTANEOUS | Status: AC
  Administered 2016-07-24 – 2016-07-28 (×5): 6 via TOPICAL

## 2016-07-24 MED ORDER — ACETAMINOPHEN 325 MG PO TABS
650.0000 mg | ORAL_TABLET | Freq: Four times a day (QID) | ORAL | Status: DC | PRN
  Administered 2016-07-30: 650 mg via ORAL
  Filled 2016-07-24: qty 2

## 2016-07-24 MED ORDER — MUPIROCIN 2 % EX OINT
1.0000 "application " | TOPICAL_OINTMENT | Freq: Two times a day (BID) | CUTANEOUS | Status: AC
  Administered 2016-07-24 – 2016-07-28 (×10): 1 via NASAL
  Filled 2016-07-24: qty 22

## 2016-07-24 MED ORDER — PANTOPRAZOLE SODIUM 40 MG PO TBEC
40.0000 mg | DELAYED_RELEASE_TABLET | Freq: Every day | ORAL | Status: DC
  Administered 2016-07-24 – 2016-07-31 (×8): 40 mg via ORAL
  Filled 2016-07-24 (×8): qty 1

## 2016-07-24 MED ORDER — ONDANSETRON HCL 4 MG PO TABS: 4.0000 mg | ORAL_TABLET | Freq: Four times a day (QID) | ORAL | Status: DC | PRN

## 2016-07-24 MED ORDER — OXYCODONE HCL 5 MG PO TABS
5.0000 mg | ORAL_TABLET | ORAL | Status: DC | PRN
  Administered 2016-07-24: 11:00:00 5 mg via ORAL
  Filled 2016-07-24: qty 1

## 2016-07-24 MED ORDER — SODIUM CHLORIDE 0.9 % IV SOLN
3.0000 g | INTRAVENOUS | Status: DC
  Administered 2016-07-24 – 2016-07-25 (×2): 3 g via INTRAVENOUS
  Filled 2016-07-24 (×3): qty 3

## 2016-07-24 MED ORDER — VITAMIN E 180 MG (400 UNIT) PO CAPS
400.0000 [IU] | ORAL_CAPSULE | ORAL | Status: DC
  Administered 2016-07-24 – 2016-07-29 (×6): 400 [IU] via ORAL
  Filled 2016-07-24 (×7): qty 1

## 2016-07-24 MED ORDER — MOMETASONE FURO-FORMOTEROL FUM 200-5 MCG/ACT IN AERO
2.0000 | INHALATION_SPRAY | Freq: Two times a day (BID) | RESPIRATORY_TRACT | Status: DC
  Administered 2016-07-24 – 2016-07-30 (×10): 2 via RESPIRATORY_TRACT
  Filled 2016-07-24: qty 8.8

## 2016-07-24 MED ORDER — LORATADINE 10 MG PO TABS
10.0000 mg | ORAL_TABLET | Freq: Every day | ORAL | Status: DC
  Administered 2016-07-24 – 2016-07-31 (×7): 10 mg via ORAL
  Filled 2016-07-24 (×7): qty 1

## 2016-07-24 MED ORDER — POLYVINYL ALCOHOL 1.4 % OP SOLN
1.0000 [drp] | Freq: Every day | OPHTHALMIC | Status: DC
  Administered 2016-07-24: 2 [drp] via OPHTHALMIC
  Administered 2016-07-25 – 2016-07-27 (×3): 1 [drp] via OPHTHALMIC
  Administered 2016-07-28 – 2016-07-31 (×4): 2 [drp] via OPHTHALMIC
  Filled 2016-07-24: qty 15

## 2016-07-24 MED ORDER — BISACODYL 5 MG PO TBEC: 5.0000 mg | DELAYED_RELEASE_TABLET | Freq: Every day | ORAL | Status: DC | PRN

## 2016-07-24 MED ORDER — PIPERACILLIN-TAZOBACTAM 3.375 G IVPB
3.3750 g | Freq: Two times a day (BID) | INTRAVENOUS | Status: DC
  Administered 2016-07-24: 10:00:00 3.375 g via INTRAVENOUS
  Filled 2016-07-24: qty 50

## 2016-07-24 MED ORDER — LORAZEPAM 2 MG/ML IJ SOLN
1.0000 mg | Freq: Once | INTRAMUSCULAR | Status: AC
  Administered 2016-07-24: 15:00:00 1 mg via INTRAVENOUS
  Filled 2016-07-24: qty 1

## 2016-07-24 MED ORDER — ATORVASTATIN CALCIUM 20 MG PO TABS
20.0000 mg | ORAL_TABLET | Freq: Every evening | ORAL | Status: DC
  Administered 2016-07-24 – 2016-07-29 (×4): 20 mg via ORAL
  Filled 2016-07-24 (×4): qty 1

## 2016-07-24 MED ORDER — SENNOSIDES-DOCUSATE SODIUM 8.6-50 MG PO TABS: 1.0000 | ORAL_TABLET | Freq: Every evening | ORAL | Status: DC | PRN

## 2016-07-24 MED ORDER — SODIUM CHLORIDE 0.9 % IV SOLN
INTRAVENOUS | Status: DC
  Administered 2016-07-24 – 2016-07-26 (×5): via INTRAVENOUS

## 2016-07-24 MED ORDER — VITAMIN D 1000 UNITS PO TABS
1000.0000 [IU] | ORAL_TABLET | ORAL | Status: DC
  Administered 2016-07-24 – 2016-07-31 (×8): 1000 [IU] via ORAL
  Filled 2016-07-24 (×8): qty 1

## 2016-07-24 MED ORDER — TRAMADOL HCL 50 MG PO TABS
50.0000 mg | ORAL_TABLET | Freq: Three times a day (TID) | ORAL | Status: DC | PRN
  Administered 2016-07-27: 50 mg via ORAL
  Filled 2016-07-24: qty 1

## 2016-07-24 MED ORDER — ACETAMINOPHEN 650 MG RE SUPP: 650.0000 mg | Freq: Four times a day (QID) | RECTAL | Status: DC | PRN

## 2016-07-24 MED ORDER — ONDANSETRON HCL 4 MG/2ML IJ SOLN: 4.0000 mg | Freq: Four times a day (QID) | INTRAMUSCULAR | Status: DC | PRN

## 2016-07-24 MED ORDER — VITAMIN B-12 1000 MCG PO TABS
2500.0000 ug | ORAL_TABLET | ORAL | Status: DC
  Administered 2016-07-24 – 2016-07-31 (×8): 2500 ug via ORAL
  Filled 2016-07-24 (×8): qty 3

## 2016-07-24 MED ORDER — MAGNESIUM CITRATE PO SOLN: 1.0000 | Freq: Once | ORAL | Status: DC | PRN

## 2016-07-24 MED ORDER — TIOTROPIUM BROMIDE MONOHYDRATE 18 MCG IN CAPS
18.0000 ug | ORAL_CAPSULE | Freq: Every day | RESPIRATORY_TRACT | Status: DC
  Administered 2016-07-24 – 2016-07-29 (×5): 18 ug via RESPIRATORY_TRACT
  Filled 2016-07-24: qty 5

## 2016-07-24 MED ORDER — NAPHAZOLINE-GLYCERIN 0.012-0.2 % OP SOLN
1.0000 [drp] | Freq: Four times a day (QID) | OPHTHALMIC | Status: DC | PRN
  Filled 2016-07-24: qty 15

## 2016-07-24 MED ORDER — ALBUTEROL SULFATE (2.5 MG/3ML) 0.083% IN NEBU
2.5000 mg | INHALATION_SOLUTION | Freq: Two times a day (BID) | RESPIRATORY_TRACT | Status: DC
  Administered 2016-07-24 – 2016-07-25 (×3): 2.5 mg via RESPIRATORY_TRACT
  Filled 2016-07-24 (×4): qty 3

## 2016-07-24 MED ORDER — OXYCODONE HCL 5 MG PO TABS
5.0000 mg | ORAL_TABLET | ORAL | Status: DC | PRN
  Administered 2016-07-24 – 2016-07-31 (×6): 5 mg via ORAL
  Filled 2016-07-24 (×6): qty 1

## 2016-07-24 MED ORDER — LEVOTHYROXINE SODIUM 75 MCG PO TABS
75.0000 ug | ORAL_TABLET | Freq: Every day | ORAL | Status: DC
  Administered 2016-07-24 – 2016-07-31 (×8): 75 ug via ORAL
  Filled 2016-07-24 (×8): qty 1

## 2016-07-24 MED ORDER — FLUTICASONE PROPIONATE 50 MCG/ACT NA SUSP
2.0000 | Freq: Every day | NASAL | Status: DC
  Administered 2016-07-24 – 2016-07-31 (×8): 2 via NASAL
  Filled 2016-07-24: qty 16

## 2016-07-24 MED ORDER — ASPIRIN EC 81 MG PO TBEC
81.0000 mg | DELAYED_RELEASE_TABLET | Freq: Every day | ORAL | Status: DC
  Administered 2016-07-24 – 2016-07-31 (×7): 81 mg via ORAL
  Filled 2016-07-24 (×7): qty 1

## 2016-07-24 MED ORDER — OCUVITE-LUTEIN PO CAPS
1.0000 | ORAL_CAPSULE | Freq: Two times a day (BID) | ORAL | Status: DC
  Administered 2016-07-24 – 2016-07-31 (×12): 1 via ORAL
  Filled 2016-07-24 (×12): qty 1

## 2016-07-24 MED ORDER — VANCOMYCIN HCL IN DEXTROSE 750-5 MG/150ML-% IV SOLN
750.0000 mg | Freq: Once | INTRAVENOUS | Status: DC
  Filled 2016-07-24: qty 150

## 2016-07-24 MED ORDER — ALBUTEROL SULFATE (2.5 MG/3ML) 0.083% IN NEBU: 2.5000 mg | INHALATION_SOLUTION | Freq: Four times a day (QID) | RESPIRATORY_TRACT | Status: DC | PRN

## 2016-07-24 NOTE — Consult Note (Signed)
[facility] Infectious Disease     Reason for Consult:Sepsis, Bacteremia    Referring Physician:  Dolores Frame Date of Admission:  07/23/2016   Active Problems:   Sepsis ([place])   Pressure injury of skin   HPI: Andrew [name] is a 83 y.o. male resident of [facility] admitted with ARF (BUN 127) and R leg pain and ulcer. Has known PAD and follows with vascular.  On admission 23, down to 19.8. No fevers Has been seen by vascular surgery with limited options at this point besides amputation. On vanco and zosyn.  BCX 1/2 + enterococcus. Changed to IV unasyn. MRSA PCR +.   Past Medical History:  Diagnosis Date  . Asthma   . Chronic kidney disease   . Hypertension   . Peripheral vascular disease [facility])    Past Surgical History:  Procedure Laterality Date  . PERIPHERAL VASCULAR CATHETERIZATION Right 02/07/2016   Procedure: Lower Extremity Angiography;  Surgeon: Algernon Huxley, MD;  Location: [place] CV LAB;  Service: Cardiovascular;  Laterality: Right;  . PERIPHERAL VASCULAR CATHETERIZATION Right 03/27/2016   Procedure: Lower Extremity Angiography;  Surgeon: Algernon Huxley, MD;  Location: [place] CV LAB;  Service: Cardiovascular;  Laterality: Right;  . PERIPHERAL VASCULAR CATHETERIZATION  03/27/2016   Procedure: Lower Extremity Intervention;  Surgeon: Algernon Huxley, MD;  Location: [place] CV LAB;  Service: Cardiovascular;;  . PERIPHERAL VASCULAR CATHETERIZATION Right 03/28/2016   Procedure: Lower Extremity Angiography;  Surgeon: Algernon Huxley, MD;  Location: [place] CV LAB;  Service: Cardiovascular;  Laterality: Right;  . PERIPHERAL VASCULAR CATHETERIZATION Right 06/01/2016   Procedure: Lower Extremity Angiography;  Surgeon: Algernon Huxley, MD;  Location: [place] CV LAB;  Service: Cardiovascular;  Laterality: Right;  . PERIPHERAL VASCULAR CATHETERIZATION  06/01/2016   Procedure: Lower Extremity Intervention;  Surgeon: Algernon Huxley, MD;  Location: [place] CV LAB;  Service:  Cardiovascular;;  . SHOULDER SURGERY  1974  . UPPER GI ENDOSCOPY     Social History  Substance Use Topics  . Smoking status: Former Research scientist (life sciences)  . Smokeless tobacco: Former Systems developer    Quit date: 05/16/1993  . Alcohol use No   Family History  Problem Relation Age of Onset  . Heart failure Mother   . Stroke Father     Allergies:  Allergies  Allergen Reactions  . Keflex [Cephalexin] Other (See Comments)    Patient states this medication made his legs hurt really bad.    Current antibiotics: Antibiotics Given (last 72 hours)    Date/Time Action Medication Dose Rate   07/24/16 1013 Given   piperacillin-tazobactam (ZOSYN) IVPB 3.375 g 3.375 g 12.5 mL/hr      MEDICATIONS: . albuterol  2.5 mg Nebulization BID  . ampicillin-sulbactam (UNASYN) IV  3 g Intravenous Q24H  . apixaban  2.5 mg Oral BID  . aspirin EC  81 mg Oral Daily  . atorvastatin  20 mg Oral QPM  . Chlorhexidine Gluconate Cloth  6 each Topical Q0600  . cholecalciferol  1,000 Units Oral BH-q7a  . clopidogrel  75 mg Oral Daily  . fluticasone  2 spray Each Nare Daily  . levothyroxine  75 mcg Oral QAC breakfast  . loratadine  10 mg Oral Daily  . LORazepam  1 mg Intravenous Once  . mometasone-formoterol  2 puff Inhalation BID  . multivitamin-lutein  1 capsule Oral BID  . mupirocin ointment  1 application Nasal BID  . pantoprazole  40 mg Oral QAC breakfast  .  polyvinyl alcohol  1-2 drop Both Eyes Daily  . tiotropium  18 mcg Inhalation Daily  . vitamin B-12  2,500 mcg Oral BH-q7a  . vitamin E  400 Units Oral BH-q7a    Review of Systems - 11 systems reviewed and negative per HPI   OBJECTIVE: Temp:  [97.5 F (36.4 C)-98.2 F (36.8 C)] 97.5 F (36.4 C) (01/22 0356) Pulse Rate:  [66-105] 94 (01/22 0356) Resp:  [14-34] 24 (01/22 0356) BP: (87-122)/(38-85) 114/38 (01/22 0356) SpO2:  [85 %-100 %] 100 % (01/22 0356) Weight:  [73 kg (160 lb 14.4 oz)-73.3 kg (161 lb 11.2 oz)] 73 kg (160 lb 14.4 oz) (01/22 0356) Physical  Exam  Constitutional:awake but a little confused, frail HENT: anicteric Mouth/Throat: Oropharynx is clear and moist. No oropharyngeal exudate.  Cardiovascular: Normal rate,- distant HS Pulmonary/Chest: Effort normal and breath sounds normal. No respiratory distress. He has no wheezes.  Abdominal: Soft. Bowel sounds are normal. He exhibits no distension. There is no tenderness.  Lymphadenopathy: He has no cervical adenopathy.  Neurological: He is alert and interactive Vascular RLE very cool, non palpable pulse, LLE with mod coolness, non palpable pulse Skin: R leg with extensive ulceration, Post achilles tendon exposed with eschar, lateral malleous with eschar.  LLE with shallow ulcerations  Psychiatric: He has a normal mood and affect. His behavior is normal.    LABS: Results for orders placed or performed during the hospital encounter of 07/23/16 (from the past 48 hour(s))  Comprehensive metabolic panel     Status: Abnormal   Collection Time: 07/23/16  5:31 PM  Result Value Ref Range   Sodium 132 (L) 135 - 145 mmol/L    Comment: REPEATED LYTES   Potassium 3.7 3.5 - 5.1 mmol/L   Chloride 86 (L) 101 - 111 mmol/L   CO2 24 22 - 32 mmol/L   Glucose, Bld 144 (H) 65 - 99 mg/dL   BUN 146 (H) 6 - 20 mg/dL    Comment: RESULT CONFIRMED BY MANUAL DILUTION [place]   Creatinine, Ser 7.82 (H) 0.61 - 1.24 mg/dL   Calcium 8.3 (L) 8.9 - 10.3 mg/dL   Total Protein 7.2 6.5 - 8.1 g/dL   Albumin 3.3 (L) 3.5 - 5.0 g/dL   AST 71 (H) 15 - 41 U/L   ALT 29 17 - 63 U/L   Alkaline Phosphatase 82 38 - 126 U/L   Total Bilirubin 0.4 0.3 - 1.2 mg/dL   GFR calc non Af Amer 5 (L) >60 mL/min   GFR calc Af Amer 6 (L) >60 mL/min    Comment: (NOTE) The eGFR has been calculated using the CKD EPI equation. This calculation has not been validated in all clinical situations. eGFR's persistently <60 mL/min signify possible Chronic Kidney Disease.    Anion gap 22 (H) 5 - 15  Troponin I     Status: Abnormal    Collection Time: 07/23/16  5:31 PM  Result Value Ref Range   Troponin I 0.08 (HH) <0.03 ng/mL    Comment: CRITICAL RESULT CALLED TO, READ BACK BY AND VERIFIED WITH KATE BUMGARNER ON 07/23/16 AT 1815 [place]   Lactic acid, plasma     Status: Abnormal   Collection Time: 07/23/16  5:31 PM  Result Value Ref Range   Lactic Acid, Venous 3.0 (HH) 0.5 - 1.9 mmol/L    Comment: CRITICAL RESULT CALLED TO, READ BACK BY AND VERIFIED WITH KATE BUMGARNER ON 07/23/16 AT 1815 [facility]   CBC with Differential     Status:  Abnormal   Collection Time: 07/23/16  5:31 PM  Result Value Ref Range   WBC 23.9 (H) 3.8 - 10.6 K/uL   RBC 3.86 (L) 4.40 - 5.90 MIL/uL   Hemoglobin 11.9 (L) 13.0 - 18.0 g/dL   HCT 34.7 (L) 40.0 - 52.0 %   MCV 90.0 80.0 - 100.0 fL   MCH 30.9 26.0 - 34.0 pg   MCHC 34.3 32.0 - 36.0 g/dL   RDW 15.5 (H) 11.5 - 14.5 %   Platelets 1,457 (HH) 150 - 440 K/uL    Comment: CRITICAL RESULT CALLED TO, READ BACK BY AND VERIFIED WITHCelso Sickle RN AT 3419 07/23/16 MSS.    Neutrophils Relative % 91 %   Neutro Abs 21.6 (H) 1.4 - 6.5 K/uL   Lymphocytes Relative 4 %   Lymphs Abs 0.8 (L) 1.0 - 3.6 K/uL   Monocytes Relative 5 %   Monocytes Absolute 1.3 (H) 0.2 - 1.0 K/uL   Eosinophils Relative 0 %   Eosinophils Absolute 0.1 0 - 0.7 K/uL   Basophils Relative 0 %   Basophils Absolute 0.1 0 - 0.1 K/uL  Blood culture (routine x 2)     Status: None (Preliminary result)   Collection Time: 07/23/16  6:49 PM  Result Value Ref Range   Specimen Description BLOOD LEFT HAND    Special Requests      BOTTLES DRAWN AEROBIC AND ANAEROBIC ANA13ML AER12ML   Culture  Setup Time      Organism ID to follow GRAM POSITIVE COCCI ANAEROBIC BOTTLE ONLY CRITICAL RESULT CALLED TO, READ BACK BY AND VERIFIED WITH: CHRISTINE KATSOUDAS ON 07/24/16 AT 1245 QSD    Culture GRAM POSITIVE COCCI    Report Status PENDING   Blood culture (routine x 2)     Status: None (Preliminary result)   Collection Time: 07/23/16  6:49 PM   Result Value Ref Range   Specimen Description BLOOD LEFT ASSIST CONTROL    Special Requests      BOTTLES DRAWN AEROBIC AND ANAEROBIC ANA13ML AER13ML   Culture NO GROWTH < 24 HOURS    Report Status PENDING   Blood Culture ID Panel (Reflexed)     Status: Abnormal   Collection Time: 07/23/16  6:49 PM  Result Value Ref Range   Enterococcus species DETECTED (A) NOT DETECTED    Comment: CRITICAL RESULT CALLED TO, READ BACK BY AND VERIFIED WITH: CHRISTINE KATSOUDAS ON 07/24/16 AT 1245 QSD    Vancomycin resistance NOT DETECTED NOT DETECTED   Listeria monocytogenes NOT DETECTED NOT DETECTED   Staphylococcus species NOT DETECTED NOT DETECTED   Staphylococcus aureus NOT DETECTED NOT DETECTED   Streptococcus species NOT DETECTED NOT DETECTED   Streptococcus agalactiae NOT DETECTED NOT DETECTED   Streptococcus pneumoniae NOT DETECTED NOT DETECTED   Streptococcus pyogenes NOT DETECTED NOT DETECTED   Acinetobacter baumannii NOT DETECTED NOT DETECTED   Enterobacteriaceae species NOT DETECTED NOT DETECTED   Enterobacter cloacae complex NOT DETECTED NOT DETECTED   Escherichia coli NOT DETECTED NOT DETECTED   Klebsiella oxytoca NOT DETECTED NOT DETECTED   Klebsiella pneumoniae NOT DETECTED NOT DETECTED   Proteus species NOT DETECTED NOT DETECTED   Serratia marcescens NOT DETECTED NOT DETECTED   Haemophilus influenzae NOT DETECTED NOT DETECTED   Neisseria meningitidis NOT DETECTED NOT DETECTED   Pseudomonas aeruginosa NOT DETECTED NOT DETECTED   Candida albicans NOT DETECTED NOT DETECTED   Candida glabrata NOT DETECTED NOT DETECTED   Candida krusei NOT DETECTED NOT DETECTED   Candida parapsilosis  NOT DETECTED NOT DETECTED   Candida tropicalis NOT DETECTED NOT DETECTED  Lactic acid, plasma     Status: Abnormal   Collection Time: 07/23/16  8:23 PM  Result Value Ref Range   Lactic Acid, Venous 2.3 (HH) 0.5 - 1.9 mmol/L    Comment: CRITICAL RESULT CALLED TO, READ BACK BY AND VERIFIED WITH KATE  BUMGARNER ON 07/23/16 AT 2110 [facility]   Basic metabolic panel     Status: Abnormal   Collection Time: 07/24/16  4:46 AM  Result Value Ref Range   Sodium 135 135 - 145 mmol/L   Potassium 3.3 (L) 3.5 - 5.1 mmol/L   Chloride 93 (L) 101 - 111 mmol/L   CO2 24 22 - 32 mmol/L   Glucose, Bld 96 65 - 99 mg/dL   BUN 133 (H) 6 - 20 mg/dL    Comment: RESULT CONFIRMED BY MANUAL DILUTION [facility]   Creatinine, Ser 6.58 (H) 0.61 - 1.24 mg/dL   Calcium 7.7 (L) 8.9 - 10.3 mg/dL   GFR calc non Af Amer 7 (L) >60 mL/min   GFR calc Af Amer 8 (L) >60 mL/min    Comment: (NOTE) The eGFR has been calculated using the CKD EPI equation. This calculation has not been validated in all clinical situations. eGFR's persistently <60 mL/min signify possible Chronic Kidney Disease.    Anion gap 18 (H) 5 - 15  CBC     Status: Abnormal   Collection Time: 07/24/16  4:46 AM  Result Value Ref Range   WBC 19.8 (H) 3.8 - 10.6 K/uL   RBC 3.31 (L) 4.40 - 5.90 MIL/uL   Hemoglobin 10.2 (L) 13.0 - 18.0 g/dL   HCT 30.1 (L) 40.0 - 52.0 %   MCV 91.0 80.0 - 100.0 fL   MCH 30.9 26.0 - 34.0 pg   MCHC 34.0 32.0 - 36.0 g/dL   RDW 15.3 (H) 11.5 - 14.5 %   Platelets 1,256 (HH) 150 - 440 K/uL    Comment: CRITICAL VALUE NOTED.  VALUE IS CONSISTENT WITH PREVIOUSLY REPORTED AND CALLED VALUE.  MRSA PCR Screening     Status: Abnormal   Collection Time: 07/24/16  4:49 AM  Result Value Ref Range   MRSA by PCR POSITIVE (A) NEGATIVE    Comment:        The GeneXpert MRSA Assay (FDA approved for NASAL specimens only), is one component of a comprehensive MRSA colonization surveillance program. It is not intended to diagnose MRSA infection nor to guide or monitor treatment for MRSA infections. RESULT CALLED TO, READ BACK BY AND VERIFIED WITH: PHYLLIS KING ON 07/24/16 AT 0649 BY [facility]   Glucose, capillary     Status: None   Collection Time: 07/24/16  7:47 AM  Result Value Ref Range   Glucose-Capillary 93 65 - 99 mg/dL  Vancomycin, random      Status: None   Collection Time: 07/24/16 10:38 AM  Result Value Ref Range   Vancomycin Rm 13     Comment:        Random Vancomycin therapeutic range is dependent on dosage and time of specimen collection. A peak range is 20.0-40.0 ug/mL A trough range is 5.0-15.0 ug/mL           No components found for: ESR, C REACTIVE PROTEIN MICRO: Recent Results (from the past 720 hour(s))  Urine culture     Status: Abnormal   Collection Time: 07/11/16  9:00 AM  Result Value Ref Range Status   Specimen Description URINE, RANDOM  Final   Special Requests NONE  Final   Culture MULTIPLE SPECIES PRESENT, SUGGEST RECOLLECTION (A)  Final   Report Status 07/13/2016 FINAL  Final  Blood culture (routine x 2)     Status: None (Preliminary result)   Collection Time: 07/23/16  6:49 PM  Result Value Ref Range Status   Specimen Description BLOOD LEFT HAND  Final   Special Requests   Final    BOTTLES DRAWN AEROBIC AND ANAEROBIC ANA13ML AER12ML   Culture  Setup Time   Final    Organism ID to follow GRAM POSITIVE COCCI ANAEROBIC BOTTLE ONLY CRITICAL RESULT CALLED TO, READ BACK BY AND VERIFIED WITH: CHRISTINE KATSOUDAS ON 07/24/16 AT 1245 QSD    Culture GRAM POSITIVE COCCI  Final   Report Status PENDING  Incomplete  Blood culture (routine x 2)     Status: None (Preliminary result)   Collection Time: 07/23/16  6:49 PM  Result Value Ref Range Status   Specimen Description BLOOD LEFT ASSIST CONTROL  Final   Special Requests   Final    BOTTLES DRAWN AEROBIC AND ANAEROBIC ANA13ML AER13ML   Culture NO GROWTH < 24 HOURS  Final   Report Status PENDING  Incomplete  Blood Culture ID Panel (Reflexed)     Status: Abnormal   Collection Time: 07/23/16  6:49 PM  Result Value Ref Range Status   Enterococcus species DETECTED (A) NOT DETECTED Final    Comment: CRITICAL RESULT CALLED TO, READ BACK BY AND VERIFIED WITH: CHRISTINE KATSOUDAS ON 07/24/16 AT 1245 QSD    Vancomycin resistance NOT DETECTED NOT DETECTED  Final   Listeria monocytogenes NOT DETECTED NOT DETECTED Final   Staphylococcus species NOT DETECTED NOT DETECTED Final   Staphylococcus aureus NOT DETECTED NOT DETECTED Final   Streptococcus species NOT DETECTED NOT DETECTED Final   Streptococcus agalactiae NOT DETECTED NOT DETECTED Final   Streptococcus pneumoniae NOT DETECTED NOT DETECTED Final   Streptococcus pyogenes NOT DETECTED NOT DETECTED Final   Acinetobacter baumannii NOT DETECTED NOT DETECTED Final   Enterobacteriaceae species NOT DETECTED NOT DETECTED Final   Enterobacter cloacae complex NOT DETECTED NOT DETECTED Final   Escherichia coli NOT DETECTED NOT DETECTED Final   Klebsiella oxytoca NOT DETECTED NOT DETECTED Final   Klebsiella pneumoniae NOT DETECTED NOT DETECTED Final   Proteus species NOT DETECTED NOT DETECTED Final   Serratia marcescens NOT DETECTED NOT DETECTED Final   Haemophilus influenzae NOT DETECTED NOT DETECTED Final   Neisseria meningitidis NOT DETECTED NOT DETECTED Final   Pseudomonas aeruginosa NOT DETECTED NOT DETECTED Final   Candida albicans NOT DETECTED NOT DETECTED Final   Candida glabrata NOT DETECTED NOT DETECTED Final   Candida krusei NOT DETECTED NOT DETECTED Final   Candida parapsilosis NOT DETECTED NOT DETECTED Final   Candida tropicalis NOT DETECTED NOT DETECTED Final  MRSA PCR Screening     Status: Abnormal   Collection Time: 07/24/16  4:49 AM  Result Value Ref Range Status   MRSA by PCR POSITIVE (A) NEGATIVE Final    Comment:        The GeneXpert MRSA Assay (FDA approved for NASAL specimens only), is one component of a comprehensive MRSA colonization surveillance program. It is not intended to diagnose MRSA infection nor to guide or monitor treatment for MRSA infections. RESULT CALLED TO, READ BACK BY AND VERIFIED WITH: PHYLLIS KING ON 07/24/16 AT 0649 BY [facility]     IMAGING: US Renal  Result Date: 07/23/2016 CLINICAL DATA:  83 y/o  M; acute  renal failure. EXAM: RENAL / URINARY  TRACT ULTRASOUND COMPLETE COMPARISON:  04/21/2004 CT of abdomen and pelvis. FINDINGS: Right Kidney: Length: 9.9 cm. Mildly increased echogenicity when compared with the liver parenchyma. Upper pole simple appearing cyst measuring up to 1.4 cm. Left Kidney: Length: 9.9 cm. Simple appearing cysts within the mid kidney measuring 2.9 in 2.3 cm. Mildly increased echogenicity of the renal parenchyma when compared to the spleen. Bladder: Appears normal for degree of bladder distention. IMPRESSION: Mild increase in kidney echogenicity bilaterally is compatible with medical renal disease. No hydronephrosis. Electronically Signed   By: Kristine Garbe M.D.   On: 07/23/2016 22:18   Dg Chest Portable 1 View  Result Date: 07/23/2016 CLINICAL DATA:  Weakness.  Acute renal injury. EXAM: PORTABLE CHEST 1 VIEW COMPARISON:  03/27/2016 FINDINGS: Heart size remains stable. Mild bilateral scarring again noted. No evidence of pulmonary infiltrate or edema. No evidence of pneumothorax or pleural effusion. IMPRESSION: No active disease. Electronically Signed   By: Earle Gell M.D.   On: 07/23/2016 18:36   Assessment:   NINO AMANO is a 83 y.o. male with enterococcal bacteremia, PAD and LE ulceration.    Recommendations Enterococcal bacteremia- UA UCX not done on admit so could be urinary source repeat bcx and check TTE Cont unasyn for now but if fu bcx neg can dc on oral augmentin for a 14 total days of treatmet  LE ulcers in setting PAD - severe disease on R, less so on L. Not obviously infected but has eschars on R.  Not a great candidate for surgery per Dr Lucky Cowboy. Would continue current measures and fu vascular  Thank you very much for allowing me to participate in the care of this patient. Please call with questions.   Cheral Marker. Ola Spurr, MD

## 2016-07-24 NOTE — ED Notes (Signed)
Pt oxygen dipped to 85%, still on 2 L nasal cannula. Oxygen slowing coming back up. Will continue to monitor.

## 2016-07-24 NOTE — Progress Notes (Signed)
Spoke with son Lamine Caravantes, [place], on the phone witnessed with Miguel Rota RN, Wardell Heath and said nurse.  Son approved giving any health information and notification of health issues while in hospital to [facility].  Zigmund Daniel is a long-time friend and neighbor who has been instrumental in the care of said patient.  Phone number for Zigmund Daniel [phone], address is [address], [place]  [address].  She is also the primary contact at [place] resources for said patient.

## 2016-07-24 NOTE — Consult Note (Addendum)
[facility] Nurse wound consult note Reason for Consult:Vacular insufficiency.  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  Present on admission.  Left anterior lower leg with blistering, weeping.  Nonintact lesions, 0.5 cm with punched out appearance.  Awaiting vascular consult.  Awaiting [place] lower extremities.  Patient states he is aware that he may lose his right foot due to poor circulation.  No palpable pedal pulse to right foot.  Necrotic tip to third toe Wound type:Nonhealing arterial ulcers.   Pressure Injury POA: N/A Measurement:Right lateral lower leg 3 cm x 3 cm 100% necrotic tissue to wound bed.  Right posterior lower leg:  4 cm x 2.2 cm 100% necrotic tissue to wound bed with visible tendon.  LEft anterior lower leg, weeping blistered skin, circumferentially.  Wound [id number], serum filled blistering Right:  Necrotic tissue, visible tendon Drainage (amount, consistency, odor) Moderate serous weeping.  Pain in right leg.  Periwound:Erytehma and weeping.  Necrotic odor to right leg.  Dressing procedure/placement/frequency:Cleanse left leg with soap and water and pat dry.  Apply alginate absorptive dressing, cover with kerlix and tape.  Change daily.  Right leg.  Cleanse with soap and water.  Apply nonadherent silicone dressing and cover with 4x4 gauze.  Secure with kerlix and tape.  Change daily. Awaiting vascular consult for further orders.   [place] team will follow and remain available to patient, medical and nursing teams.  Domenic Moras RN BSN [place] Pager [phone]

## 2016-07-24 NOTE — Progress Notes (Signed)
Pharmacy Antibiotic Note  Andrew [name] is a 83 y.o. male admitted on 07/23/2016 with sepsis.  Pharmacy has been consulted for Zosyn and vancomycin dosing.  Plan: 1. Zosyn 3.375 gm IV Q12H EI 2. Vancomycin 1 gm IV x 1. SCr improved this AM. Will draw random level and redose as needed due to kidney function.   Height: 5\' 9"  (175.3 cm) Weight: 160 lb 14.4 oz (73 kg) IBW/kg (Calculated) : 70.7  Temp (24hrs), Avg:97.9 F (36.6 C), Min:97.5 F (36.4 C), Max:98.2 F (36.8 C)   Recent Labs Lab 07/23/16 1731 07/23/16 2023 07/24/16 0446  WBC 23.9*  --  19.8*  CREATININE 7.82*  --  6.58*  LATICACIDVEN 3.0* 2.3*  --     Estimated Creatinine Clearance: 7.3 mL/min (by C-G formula based on SCr of 6.58 mg/dL (H)).    Allergies  Allergen Reactions  . Keflex [Cephalexin] Other (See Comments)    Patient states this medication made his legs hurt really bad.   Thank you for allowing pharmacy to be a part of this patient's care.  Laural Benes, Pharm.D., BCPS Clinical Pharmacist 07/24/2016 6:41 AM

## 2016-07-24 NOTE — ED Notes (Addendum)
Pt attempting to use urinal. Urine got all over bed. Linens changed. New brief applied. Chuck placed under pt. Pt moved up in bed, knees elevated. Pt ate half a banana.

## 2016-07-24 NOTE — Progress Notes (Signed)
[place] technician called stating that patient is combative and that she is unable to complete the [place] Lower art study ordered at this time. Vascular MD On-call called (Dr. Delana Meyer) and made aware- no new orders at this time.

## 2016-07-24 NOTE — Progress Notes (Addendum)
Pharmacy Antibiotic Note  Andrew [name] is a 83 y.o. male admitted on 07/23/2016 with sepsis.  Pharmacy has been consulted for Zosyn and vancomycin dosing.   BCID now showing Enterococcus species Andrew [name] not detected). After discussion with Dr. Anselm Jungling will discontinue Vanc and Zosyn and begin treatment with Unasyn  Plan: Will start Unasyn 3gm IV every 24  Hours based on current CrCl of <29ml/min  Height: 5\' 9"  (175.3 cm) Weight: 160 lb 14.4 oz (73 kg) IBW/kg (Calculated) : 70.7  Temp (24hrs), Avg:97.9 F (36.6 C), Min:97.5 F (36.4 C), Max:98.2 F (36.8 C)   Recent Labs Lab 07/23/16 1731 07/23/16 2023 07/24/16 0446 07/24/16 1038  WBC 23.9*  --  19.8*  --   CREATININE 7.82*  --  6.58*  --   LATICACIDVEN 3.0* 2.3*  --   --   VANCORANDOM  --   --   --  13    Estimated Creatinine Clearance: 7.3 mL/min (by C-G formula based on SCr of 6.58 mg/dL (H)).    Allergies  Allergen Reactions  . Keflex [Cephalexin] Other (See Comments)    Patient states this medication made his legs hurt really bad.   Thank you for allowing pharmacy to be a part of this patient's care.  [name] M [name], Pharm.D., BCPS Clinical Pharmacist 07/24/2016 11:13 AM

## 2016-07-24 NOTE — Consult Note (Signed)
[facility] Consult Note  MRN : [id number]  Andrew [name] is a 83 y.o. (Jan 10, 1923) male who presents with chief complaint of  Chief Complaint  Patient presents with  . Abnormal Lab  .  History of Present Illness: I am asked to see the patient by Dr. Ara Kussmaul for nonhealing ulcerations with infection of the right lower extremity patient is a known patient to the vascular service and has undergone multiple previous revascularization procedures to the right lower extremity. He at this point has essentially no runoff with non-reconstructable vascular disease. He was told months ago that his only option at this point would be a major amputation if his pain became intractable his infection was not able to be managed with antibiotics and local wound. He is actually done reasonably well for several months with this continuing. He is admitted at this point with fever, acute kidney injury, and redness of the right lower extremity. His pain is currently described in the 5-6 out of 10 range.  Current Facility-Administered Medications  Medication Dose Route Frequency Provider Last Rate Last Dose  . 0.9 %  sodium chloride infusion   Intravenous Continuous Alexis Hugelmeyer, DO 100 mL/hr at 07/24/16 0501    . acetaminophen (TYLENOL) tablet 650 mg  650 mg Oral Q6H PRN Alexis Hugelmeyer, DO       Or  . acetaminophen (TYLENOL) suppository 650 mg  650 mg Rectal Q6H PRN Alexis Hugelmeyer, DO      . albuterol (PROVENTIL) (2.5 MG/3ML) 0.083% nebulizer solution 2.5 mg  2.5 mg Nebulization BID Alexis Hugelmeyer, DO   2.5 mg at 07/24/16 1042  . albuterol (PROVENTIL) (2.5 MG/3ML) 0.083% nebulizer solution 2.5 mg  2.5 mg Nebulization Q6H PRN Alexis Hugelmeyer, DO      . apixaban (ELIQUIS) tablet 2.5 mg  2.5 mg Oral BID Alexis Hugelmeyer, DO   2.5 mg at 07/24/16 1014  . aspirin EC tablet 81 mg  81 mg Oral Daily Alexis Hugelmeyer, DO   81 mg at 07/24/16 1013  . atorvastatin  (LIPITOR) tablet 20 mg  20 mg Oral QPM Alexis Hugelmeyer, DO      . bisacodyl (DULCOLAX) EC tablet 5 mg  5 mg Oral Daily PRN Alexis Hugelmeyer, DO      . Chlorhexidine Gluconate Cloth 2 % PADS 6 each  6 each Topical Q0600 Vaughan Basta, MD   6 each at 07/24/16 1015  . cholecalciferol (VITAMIN D) tablet 1,000 Units  1,000 Units Oral BH-q7a Alexis Hugelmeyer, DO   1,000 Units at 07/24/16 1013  . clopidogrel (PLAVIX) tablet 75 mg  75 mg Oral Daily Alexis Hugelmeyer, DO   75 mg at 07/24/16 1013  . fluticasone (FLONASE) 50 MCG/ACT nasal spray 2 spray  2 spray Each Nare Daily Alexis Hugelmeyer, DO   2 spray at 07/24/16 1014  . levothyroxine (SYNTHROID, LEVOTHROID) tablet 75 mcg  75 mcg Oral QAC breakfast Alexis Hugelmeyer, DO   75 mcg at 07/24/16 1013  . loratadine (CLARITIN) tablet 10 mg  10 mg Oral Daily Alexis Hugelmeyer, DO   10 mg at 07/24/16 1013  . magnesium citrate solution 1 Bottle  1 Bottle Oral Once PRN Alexis Hugelmeyer, DO      . mometasone-formoterol (DULERA) 200-5 MCG/ACT inhaler 2 puff  2 puff Inhalation BID Alexis Hugelmeyer, DO   2 puff at 07/24/16 1015  . multivitamin-lutein (OCUVITE-LUTEIN) capsule 1 capsule  1 capsule Oral BID Alexis Hugelmeyer, DO   1 capsule at 07/24/16 1014  .  mupirocin ointment (BACTROBAN) 2 % 1 application  1 application Nasal BID Vaughan Basta, MD   1 application at [id number]  . naphazoline-glycerin (CLEAR EYES) ophth solution 1-2 drop  1-2 drop Both Eyes QID PRN Alexis Hugelmeyer, DO      . ondansetron (ZOFRAN) tablet 4 mg  4 mg Oral Q6H PRN Alexis Hugelmeyer, DO       Or  . ondansetron (ZOFRAN) injection 4 mg  4 mg Intravenous Q6H PRN Alexis Hugelmeyer, DO      . oxyCODONE (Oxy IR/ROXICODONE) immediate release tablet 5 mg  5 mg Oral Q4H PRN Alexis Hugelmeyer, DO   5 mg at 07/24/16 1032  . pantoprazole (PROTONIX) EC tablet 40 mg  40 mg Oral QAC breakfast Alexis Hugelmeyer, DO   40 mg at 07/24/16 1013  . piperacillin-tazobactam (ZOSYN) IVPB  3.375 g  3.375 g Intravenous Q12H Pavan Pyreddy, MD   3.375 g at 07/24/16 1013  . polyvinyl alcohol (LIQUIFILM TEARS) 1.4 % ophthalmic solution 1-2 drop  1-2 drop Both Eyes Daily Alexis Hugelmeyer, DO   2 drop at 07/24/16 1014  . senna-docusate (Senokot-S) tablet 1 tablet  1 tablet Oral QHS PRN Alexis Hugelmeyer, DO      . tiotropium [facility]) inhalation capsule 18 mcg  18 mcg Inhalation Daily Alexis Hugelmeyer, DO   18 mcg at 07/24/16 1014  . traMADol (ULTRAM) tablet 50-100 mg  50-100 mg Oral TID PRN Alexis Hugelmeyer, DO      . vitamin B-12 (CYANOCOBALAMIN) tablet 2,500 mcg  2,500 mcg Oral BH-q7a Alexis Hugelmeyer, DO   2,500 mcg at 07/24/16 1013  . vitamin E capsule 400 Units  400 Units Oral BH-q7a Alexis Hugelmeyer, DO   400 Units at 07/24/16 1013    Past Medical History:  Diagnosis Date  . Asthma   . Chronic kidney disease   . Hypertension   . Peripheral vascular disease [facility])     Past Surgical History:  Procedure Laterality Date  . PERIPHERAL VASCULAR CATHETERIZATION Right 02/07/2016   Procedure: Lower Extremity Angiography;  Surgeon: Algernon Huxley, MD;  Location: [place] CV [facility];  Service: Cardiovascular;  Laterality: Right;  . PERIPHERAL VASCULAR CATHETERIZATION Right 03/27/2016   Procedure: Lower Extremity Angiography;  Surgeon: Algernon Huxley, MD;  Location: [place] [facility];  Service: Cardiovascular;  Laterality: Right;  . PERIPHERAL VASCULAR CATHETERIZATION  03/27/2016   Procedure: Lower Extremity Intervention;  Surgeon: Algernon Huxley, MD;  Location: [place] [facility];  Service: Cardiovascular;;  . PERIPHERAL VASCULAR CATHETERIZATION Right 03/28/2016   Procedure: Lower Extremity Angiography;  Surgeon: Algernon Huxley, MD;  Location: [place] [facility];  Service: Cardiovascular;  Laterality: Right;  . PERIPHERAL VASCULAR CATHETERIZATION Right 06/01/2016   Procedure: Lower Extremity Angiography;  Surgeon: Algernon Huxley, MD;  Location: [place] [facility];  Service: Cardiovascular;   Laterality: Right;  . PERIPHERAL VASCULAR CATHETERIZATION  06/01/2016   Procedure: Lower Extremity Intervention;  Surgeon: Algernon Huxley, MD;  Location: [place] [facility];  Service: Cardiovascular;;  . SHOULDER SURGERY  1974  . UPPER GI ENDOSCOPY      Social History Social History  Substance Use Topics  . Smoking status: Former Research scientist (life sciences)  . Smokeless tobacco: Former Systems developer    Quit date: 05/16/1993  . Alcohol use No  No IVDU  Family History Family History  Problem Relation Age of Onset  . Heart failure Mother   . Stroke Father   No bleeding or clotting disorders.  Allergies  Allergen  Reactions  . Keflex [Cephalexin] Other (See Comments)    Patient states this medication made his legs hurt really bad.     REVIEW OF SYSTEMS (Negative unless checked)  Constitutional: [] Weight loss  [x] Fever  [] Chills Cardiac: [] Chest pain   [] Chest pressure   [] Palpitations   [] Shortness of breath when laying flat   [] Shortness of breath at rest   [] Shortness of breath with exertion. Vascular:  [] Pain in legs with walking   [] Pain in legs at rest   [] Pain in legs when laying flat   [] Claudication   [] Pain in feet when walking  [] Pain in feet at rest  [] Pain in feet when laying flat   [] History of DVT   [] Phlebitis   [x] Swelling in legs   [] Varicose veins   [x] Non-healing ulcers Pulmonary:   [] Uses home oxygen   [] Productive cough   [] Hemoptysis   [] Wheeze  [] COPD   [] Asthma Neurologic:  [] Dizziness  [] Blackouts   [] Seizures   [] History of stroke   [] History of TIA  [] Aphasia   [] Temporary blindness   [] Dysphagia   [] Weakness or numbness in arms   [] Weakness or numbness in legs Musculoskeletal:  [] Arthritis   [] Joint swelling   [] Joint pain   [] Low back pain Hematologic:  [] Easy bruising  [] Easy bleeding   [] Hypercoagulable state   [] Anemic  [] Hepatitis Gastrointestinal:  [] Blood in stool   [] Vomiting blood  [] Gastroesophageal reflux/heartburn   [] Difficulty swallowing. Genitourinary:  [x] Chronic  kidney disease   [] Difficult urination  [] Frequent urination  [] Burning with urination   [] Blood in urine Skin:  [] Rashes   [x] Ulcers   [x] Wounds Psychological:  [] History of anxiety   []  History of major depression.  Physical Examination  Vitals:   07/24/16 0200 07/24/16 0300 07/24/16 0331 07/24/16 0356  BP: (!) 98/48 (!) 99/51 (!) 113/59 (!) 114/38  Pulse: (!) 103  66 94  Resp: (!) 23 (!) 25 20 (!) 24  Temp:    97.5 F (36.4 C)  TempSrc:    Oral  SpO2: 98%  100% 100%  Weight:    73 kg (160 lb 14.4 oz)  Height:    5\' 9"  (1.753 m)   Body mass index is 23.76 kg/m. Gen:  WD/WN, NAD Head: [place]/AT, No temporalis wasting. Prominent temp pulse not noted. Ear/Nose/Throat: Hearing grossly intact, nares w/o erythema or drainage, oropharynx w/o Erythema/Exudate Eyes: Sclera non-icteric, conjunctiva clear Neck: Trachea midline.  No JVD.  Pulmonary:  Good air movement, respirations not labored, equal bilaterally.  Cardiac: RRR, normal S1, S2. Vascular:  Vessel Right Left  Radial Palpable Palpable  Ulnar Palpable Palpable  Brachial Palpable Palpable  Carotid Palpable, without bruit Palpable, without bruit  Aorta Not palpable N/A  Femoral Palpable Palpable  Popliteal Not Palpable Not Palpable  PT Not Palpable Not Palpable  DP Not Palpable Trace Palpable   Gastrointestinal: soft, non-tender/non-distended. No guarding/reflex.  Musculoskeletal: M/S 5/5 throughout. 1+ bilateral lower extremity edema. Multiple ulcerations on the right leg including a necrotic eschar over the Achilles and heel area as well as the right lateral ankle area. Several dark spots on the toes. On the left leg, there is a shallow ulceration medially on the lower calf and ankle area. Neurologic: Sensation grossly intact in extremities.  Symmetrical.  Speech is fluent. Motor exam as listed above. Psychiatric: Patient is confused and seems to be less clear and less oriented than on previous evaluations Dermatologic: No  rashes or ulcers noted.  No cellulitis or open wounds. Lymph : No  Cervical, Axillary, or Inguinal lymphadenopathy.      CBC Lab Results  Component Value Date   WBC 19.8 (H) 07/24/2016   HGB 10.2 (L) 07/24/2016   HCT 30.1 (L) 07/24/2016   MCV 91.0 07/24/2016   PLT 1,256 (HH) 07/24/2016    BMET    Component Value Date/Time   NA 135 07/24/2016 0446   K 3.3 (L) 07/24/2016 0446   CL 93 (L) 07/24/2016 0446   CO2 24 07/24/2016 0446   GLUCOSE 96 07/24/2016 0446   BUN 133 (H) 07/24/2016 0446   CREATININE 6.58 (H) 07/24/2016 0446   CALCIUM 7.7 (L) 07/24/2016 0446   GFRNONAA 7 (L) 07/24/2016 0446   GFRAA 8 (L) 07/24/2016 0446   Estimated Creatinine Clearance: 7.3 mL/min (by C-G formula based on SCr of 6.58 mg/dL (H)).  COAG No results found for: INR, PROTIME  Radiology US Renal  Result Date: 07/23/2016 CLINICAL DATA:  83 y/o  M; acute renal failure. EXAM: RENAL / URINARY TRACT ULTRASOUND COMPLETE COMPARISON:  04/21/2004 CT of abdomen and pelvis. FINDINGS: Right Kidney: Length: 9.9 cm. Mildly increased echogenicity when compared with the liver parenchyma. Upper pole simple appearing cyst measuring up to 1.4 cm. Left Kidney: Length: 9.9 cm. Simple appearing cysts within the mid kidney measuring 2.9 in 2.3 cm. Mildly increased echogenicity of the renal parenchyma when compared to the spleen. Bladder: Appears normal for degree of bladder distention. IMPRESSION: Mild increase in kidney echogenicity bilaterally is compatible with medical renal disease. No hydronephrosis. Electronically Signed   By: Kristine Garbe M.D.   On: 07/23/2016 22:18   Dg Chest Portable 1 View  Result Date: 07/23/2016 CLINICAL DATA:  Weakness.  Acute renal injury. EXAM: PORTABLE CHEST 1 VIEW COMPARISON:  03/27/2016 FINDINGS: Heart size remains stable. Mild bilateral scarring again noted. No evidence of pulmonary infiltrate or edema. No evidence of pneumothorax or pleural effusion. IMPRESSION: No active  disease. Electronically Signed   By: Earle Gell M.D.   On: 07/23/2016 18:36      Assessment/Plan 1. PAD with multiple ulcerations of the right lower extremity. He is undergone multiple previous revascularization procedures, and has essentially no runoff distally with non-reconstructable vascular disease. He has been told previously that all we really have to offer at this point is an amputation. I have told him that this would be needed if his pain was intractable or his infection cannot be controlled with local wound care and antibiotics. At this point, his pain is not severe enough that he would like an amputation although he is becoming increasingly confused at some point he will not be able to make this decision on his own I do not believe. I would continue the IV antibiotics until switching him over to oral antibiotics and several days. I will continue the local wound care. I am concerned about the shallow ulcerations on the left leg and some point he may have to have revascularization procedures on the left leg although I would not do that at this point. For now, I would just continue local wound care and antibiotics and I will be on standby if his situation becomes more intractable and requires amputation. 2. Acute kidney injury on top of mild chronic kidney disease. Even creatinine are improved today from last night with hydration, but the still remain markedly elevated. Nephrology is seeing as well. May require dialysis of his renal function does not continue to improve 3. Hypertension. Stable on outpatient medications.   Leotis Pain, MD  07/24/2016 10:48  AM    This note was created with Dragon medical transcription system.  Any error is purely unintentional

## 2016-07-24 NOTE — ED Notes (Signed)
Pt has had stents placed in R leg in December

## 2016-07-24 NOTE — Care Management Important Message (Signed)
Important Message  Patient Details  Name: Andrew [name] MRN: [id number] Date of Birth: September 02, 1922   Medicare Important Message Given:  Yes    Shelbie Ammons, RN 07/24/2016, 8:42 AM

## 2016-07-24 NOTE — ED Notes (Signed)
Pt oxygen dropped to 87% while changing bandages on legs. Oxygen upped to 4 L nasal cannula.  Gauze wrappings on both lower legs were halfway fallen off. Re-wrapped legs, L leg pad was saturated so it was changed. Pt verbalized comfort with new wrappings.

## 2016-07-24 NOTE — Evaluation (Signed)
Physical Therapy Evaluation Patient Details Name: Andrew [name] MRN: [id number] DOB: [birth year] Today's Date: 07/24/2016   History of Present Illness  Pt is a 83 y.o. male presenting to hospital from facility with elevated BUN.  Pt admitted to hospital with sepsis (probably secondary to cellulitis and LE wounds) and AKI.  Per chart review pt with chronic nonhealing arterial ulcers R posterior lower leg and R lateral lower leg.  Pt with multiple previous revascularization procedures R LE (essentially no run-off distally with non-reconstructable vascular disease).  PMH includes CKD, htn, PVD, asthma.  Clinical Impression  Prior to hospital admission, SW reports pt was at [facility] for STR.  Pt appearing to be a poor historian and unable to provide details of prior function (per chart review pt does not appear to have been ambulatory end of 2017).  Currently pt is mod assist supine to sit and min assist sit to supine.  Pt with fairly good sitting balance but mildly impulsive and appearing confused in general.  Further mobility deferred for safety reason's d/t pt's difficulty following commands during session.  Pt would benefit from skilled PT to address noted impairments and functional limitations.  Recommend pt discharge back to STR when medically appropriate.    Follow Up Recommendations SNF    Equipment Recommendations   (TBD)    Recommendations for Other Services       Precautions / Restrictions Precautions Precautions: Fall Restrictions Weight Bearing Restrictions: No      Mobility  Bed Mobility Overal bed mobility: Needs Assistance Bed Mobility: Supine to Sit;Sit to Supine     Supine to sit: Mod assist;HOB elevated Sit to supine: Min assist   General bed mobility comments: assist for trunk supine to sit; assist for LE's sit to supine; 2 assist to boost pt up in bed  Transfers                 General transfer comment: Pt unable to laterally scoot edge of bed on  own with cueing  Ambulation/Gait                Stairs            Wheelchair Mobility    Modified Rankin (Stroke Patients Only)       Balance Overall balance assessment: Needs assistance Sitting-balance support: Bilateral upper extremity supported;Feet supported Sitting balance-Leahy Scale: Good Sitting balance - Comments: reaching with B UE's just outside BOS                                     Pertinent Vitals/Pain Pain Assessment: Faces Faces Pain Scale: Hurts even more Pain Location: R lower leg Pain Descriptors / Indicators: Tender;Sore Pain Intervention(s): Limited activity within patient's tolerance;Monitored during session;Repositioned    Home Living Family/patient expects to be discharged to:: Skilled nursing facility                      Prior Function Level of Independence: Needs assistance   Gait / Transfers Assistance Needed: Pt unable to describe PLOF but per chart review pt has not walked in past few months.           Hand Dominance        Extremity/Trunk Assessment   Upper Extremity Assessment Upper Extremity Assessment: Generalized weakness    Lower Extremity Assessment Lower Extremity Assessment: RLE deficits/detail;Difficult to assess due to impaired cognition RLE  Deficits / Details: R knee extension grossly 35 degrees short of neutral semi-supine       Communication   Communication: HOH  Cognition Arousal/Alertness: Awake/alert Behavior During Therapy: Impulsive Overall Cognitive Status: No family/caregiver present to determine baseline cognitive functioning (Oriented to person but not place, situation, or month (knew year))                 General Comments: Pt laying in bed with no gown on but covered in sheet and with B handmitts donned beginning of session.    General Comments General comments (skin integrity, edema, etc.): Pt laying in bed with B knees flexed and feet flat on bed.  Pt  agreeable to PT session.    Exercises  Sitting reaching; bed mobility and transfer training   Assessment/Plan    PT Assessment Patient needs continued PT services  PT Problem List Decreased strength;Decreased range of motion;Decreased mobility;Decreased cognition;Decreased safety awareness;Pain          PT Treatment Interventions DME instruction;Functional mobility training;Therapeutic activities;Therapeutic exercise;Patient/family education    PT Goals (Current goals can be found in the Care Plan section)  Acute Rehab PT Goals Patient Stated Goal: to have less R LE pain PT Goal Formulation: With patient Time For Goal Achievement: 08/07/16 Potential to Achieve Goals: Fair    Frequency Min 2X/week   Barriers to discharge        Co-evaluation               End of Session   Activity Tolerance: Patient tolerated treatment well Patient left: in bed;with call bell/phone within reach;with nursing/sitter in room (Nursing present attempting to place IV; nursing notified bed alarm not on; B hand mitts donned end of PT session) Nurse Communication: Mobility status;Precautions         Time: [id number] PT Time Calculation (min) (ACUTE ONLY): 24 min   Charges:   PT Evaluation $PT Eval Low Complexity: 1 Procedure PT Treatments $Therapeutic Activity: 8-22 mins   PT G CodesLeitha Bleak 07-25-2016, 3:07 PM Leitha Bleak, [place]

## 2016-07-24 NOTE — Progress Notes (Signed)
Subjective:  Patient Known to our practice from outpatient. He is followed by Dr. Holley Raring for CK D   he is admitted for altered mental status which is thought to be secondary to sepsis from cellulitis of lower extremity wounds He is also found to have severe acute kidney injury His baseline creatinine appears to be 1.10 from December 3 Admission creatinine is 7.82 Today's creatinine has slightly improved to 6.58, BUN is 133 Lactate Was elevated  Patient is severely lethargic, he is not able to provide any meaningful information. He is mumbling  Blood cultures are positive for enterococcus  Objective:  Vital signs in last 24 hours:  Temp:  [97.5 F (36.4 C)-98 F (36.7 C)] 98 F (36.7 C) (01/22 1542) Pulse Rate:  [66-122] 122 (01/22 1542) Resp:  [14-34] 22 (01/22 1542) BP: (87-122)/(38-65) 100/49 (01/22 1542) SpO2:  [85 %-100 %] 98 % (01/22 1542) Weight:  [73 kg (160 lb 14.4 oz)] 73 kg (160 lb 14.4 oz) (01/22 0356)  Weight change:  Filed Weights   07/23/16 1725 07/24/16 0356  Weight: 73.3 kg (161 lb 11.2 oz) 73 kg (160 lb 14.4 oz)    Intake/Output:    Intake/Output Summary (Last 24 hours) at 07/24/16 1739 Last data filed at 07/24/16 0501  Gross per 24 hour  Intake                0 ml  Output                0 ml  Net                0 ml     Physical Exam: General: Critically ill-appearing   HEENT Dry oral mucous membranes   Neck Supple  Pulm/lungs Shallow breathing effort, clear anteriorly and laterally   CVS/Heart Irregular   Abdomen:  Soft, nontender   Extremities: Redness, edema over bilateral lower extremities, foul smelling wounds   Neurologic: Mumbling, not able to answer any questions  Skin: Erythema over lower abdominal area           Basic Metabolic Panel:   Recent Labs Lab 07/23/16 1731 07/24/16 0446  NA 132* 135  K 3.7 3.3*  CL 86* 93*  CO2 24 24  GLUCOSE 144* 96  BUN 146* 133*  CREATININE 7.82* 6.58*  CALCIUM 8.3* 7.7*      CBC:  Recent Labs Lab 07/23/16 1731 07/24/16 0446  WBC 23.9* 19.8*  NEUTROABS 21.6*  --   HGB 11.9* 10.2*  HCT 34.7* 30.1*  MCV 90.0 91.0  PLT 1,457* 1,256*      Microbiology:  Recent Results (from the past 720 hour(s))  Urine culture     Status: Abnormal   Collection Time: 07/11/16  9:00 AM  Result Value Ref Range Status   Specimen Description URINE, RANDOM  Final   Special Requests NONE  Final   Culture MULTIPLE SPECIES PRESENT, SUGGEST RECOLLECTION (A)  Final   Report Status 07/13/2016 FINAL  Final  Blood culture (routine x 2)     Status: None (Preliminary result)   Collection Time: 07/23/16  6:49 PM  Result Value Ref Range Status   Specimen Description BLOOD LEFT HAND  Final   Special Requests   Final    BOTTLES DRAWN AEROBIC AND ANAEROBIC ANA13ML AER12ML   Culture  Setup Time   Final    Organism ID to follow GRAM POSITIVE COCCI ANAEROBIC BOTTLE ONLY CRITICAL RESULT CALLED TO, READ BACK BY AND VERIFIED WITH: CHRISTINE KATSOUDAS  ON 07/24/16 AT 1245 QSD    Culture GRAM POSITIVE COCCI  Final   Report Status PENDING  Incomplete  Blood culture (routine x 2)     Status: None (Preliminary result)   Collection Time: 07/23/16  6:49 PM  Result Value Ref Range Status   Specimen Description BLOOD LEFT ASSIST CONTROL  Final   Special Requests   Final    BOTTLES DRAWN AEROBIC AND ANAEROBIC ANA13ML AER13ML   Culture  Setup Time GRAM POSITIVE COCCI ANAEROBIC BOTTLE ONLY   Final   Culture GRAM POSITIVE COCCI  Final   Report Status PENDING  Incomplete  Blood Culture ID Panel (Reflexed)     Status: Abnormal   Collection Time: 07/23/16  6:49 PM  Result Value Ref Range Status   Enterococcus species DETECTED (A) NOT DETECTED Final    Comment: CRITICAL RESULT CALLED TO, READ BACK BY AND VERIFIED WITH: CHRISTINE KATSOUDAS ON 07/24/16 AT 1245 QSD    Vancomycin resistance NOT DETECTED NOT DETECTED Final   Listeria monocytogenes NOT DETECTED NOT DETECTED Final    Staphylococcus species NOT DETECTED NOT DETECTED Final   Staphylococcus aureus NOT DETECTED NOT DETECTED Final   Streptococcus species NOT DETECTED NOT DETECTED Final   Streptococcus agalactiae NOT DETECTED NOT DETECTED Final   Streptococcus pneumoniae NOT DETECTED NOT DETECTED Final   Streptococcus pyogenes NOT DETECTED NOT DETECTED Final   Acinetobacter baumannii NOT DETECTED NOT DETECTED Final   Enterobacteriaceae species NOT DETECTED NOT DETECTED Final   Enterobacter cloacae complex NOT DETECTED NOT DETECTED Final   Escherichia coli NOT DETECTED NOT DETECTED Final   Klebsiella oxytoca NOT DETECTED NOT DETECTED Final   Klebsiella pneumoniae NOT DETECTED NOT DETECTED Final   Proteus species NOT DETECTED NOT DETECTED Final   Serratia marcescens NOT DETECTED NOT DETECTED Final   Haemophilus influenzae NOT DETECTED NOT DETECTED Final   Neisseria meningitidis NOT DETECTED NOT DETECTED Final   Pseudomonas aeruginosa NOT DETECTED NOT DETECTED Final   Candida albicans NOT DETECTED NOT DETECTED Final   Candida glabrata NOT DETECTED NOT DETECTED Final   Candida krusei NOT DETECTED NOT DETECTED Final   Candida parapsilosis NOT DETECTED NOT DETECTED Final   Candida tropicalis NOT DETECTED NOT DETECTED Final  MRSA PCR Screening     Status: Abnormal   Collection Time: 07/24/16  4:49 AM  Result Value Ref Range Status   MRSA by PCR POSITIVE (A) NEGATIVE Final    Comment:        The GeneXpert MRSA Assay (FDA approved for NASAL specimens only), is one component of a comprehensive MRSA colonization surveillance program. It is not intended to diagnose MRSA infection nor to guide or monitor treatment for MRSA infections. RESULT CALLED TO, READ BACK BY AND VERIFIED WITH: PHYLLIS KING ON 07/24/16 AT 0649 BY [facility]     Coagulation Studies: No results for input(s): LABPROT, INR in the last 72 hours.  Urinalysis: No results for input(s): COLORURINE, LABSPEC, PHURINE, GLUCOSEU, HGBUR, BILIRUBINUR,  KETONESUR, PROTEINUR, UROBILINOGEN, NITRITE, LEUKOCYTESUR in the last 72 hours.  Invalid input(s): APPERANCEUR    Imaging: US Renal  Result Date: 07/23/2016 CLINICAL DATA:  83 y/o  M; acute renal failure. EXAM: RENAL / URINARY TRACT ULTRASOUND COMPLETE COMPARISON:  04/21/2004 CT of abdomen and pelvis. FINDINGS: Right Kidney: Length: 9.9 cm. Mildly increased echogenicity when compared with the liver parenchyma. Upper pole simple appearing cyst measuring up to 1.4 cm. Left Kidney: Length: 9.9 cm. Simple appearing cysts within the mid kidney measuring 2.9 in 2.3 cm.  Mildly increased echogenicity of the renal parenchyma when compared to the spleen. Bladder: Appears normal for degree of bladder distention. IMPRESSION: Mild increase in kidney echogenicity bilaterally is compatible with medical renal disease. No hydronephrosis. Electronically Signed   By: Kristine Garbe M.D.   On: 07/23/2016 22:18   Dg Chest Portable 1 View  Result Date: 07/23/2016 CLINICAL DATA:  Weakness.  Acute renal injury. EXAM: PORTABLE CHEST 1 VIEW COMPARISON:  03/27/2016 FINDINGS: Heart size remains stable. Mild bilateral scarring again noted. No evidence of pulmonary infiltrate or edema. No evidence of pneumothorax or pleural effusion. IMPRESSION: No active disease. Electronically Signed   By: Earle Gell M.D.   On: 07/23/2016 18:36     Medications:   . sodium chloride 100 mL/hr at 07/24/16 0501   . albuterol  2.5 mg Nebulization BID  . ampicillin-sulbactam (UNASYN) IV  3 g Intravenous Q24H  . apixaban  2.5 mg Oral BID  . aspirin EC  81 mg Oral Daily  . atorvastatin  20 mg Oral QPM  . Chlorhexidine Gluconate Cloth  6 each Topical Q0600  . cholecalciferol  1,000 Units Oral BH-q7a  . clopidogrel  75 mg Oral Daily  . fluticasone  2 spray Each Nare Daily  . levothyroxine  75 mcg Oral QAC breakfast  . loratadine  10 mg Oral Daily  . mometasone-formoterol  2 puff Inhalation BID  . multivitamin-lutein  1 capsule  Oral BID  . mupirocin ointment  1 application Nasal BID  . pantoprazole  40 mg Oral QAC breakfast  . polyvinyl alcohol  1-2 drop Both Eyes Daily  . tiotropium  18 mcg Inhalation Daily  . vitamin B-12  2,500 mcg Oral BH-q7a  . vitamin E  400 Units Oral BH-q7a   acetaminophen **OR** acetaminophen, albuterol, bisacodyl, magnesium citrate, naphazoline-glycerin, ondansetron **OR** ondansetron (ZOFRAN) IV, oxyCODONE, senna-docusate, traMADol  Assessment/ Plan:  83 y.o. male With asthma, chronic kidney disease, hypertension, peripheral vascular disease, nursing home resident at [facility]  1. Acute kidney injury 2. Chronic kidney disease stage III 3. Sepsis from lower extremity wounds, enterococcus bacteremia 4. Hypokalemia 5. Lactic acidosis  Acute kidney injury is likely secondary to severe dehydration leading to possibly ATN. Sepsis, bacteremia and cellulitis are also playing a role. Agree with gentle IV fluid resuscitation Serum creatinine and BUN have already started to improve Ultrasound is negative for obstruction Supportive care. Will follow   LOS: 0 Sloan Galentine 1/22/20185:39 PM

## 2016-07-24 NOTE — Progress Notes (Signed)
[place] at [place] NAME: Andrew [name]    MR#:  [id number]  DATE OF BIRTH:  December 02, 1922  SUBJECTIVE:  CHIEF COMPLAINT:   Chief Complaint  Patient presents with  . Abnormal Lab   came with confusion, he have chronic vascular issues on his leg- have confusion. Found to have cellulitis and bacteremia. Also have acute renal failure.  REVIEW OF SYSTEMS:  Because of confusion, not giving much details. ROS  DRUG ALLERGIES:   Allergies  Allergen Reactions  . Keflex [Cephalexin] Other (See Comments)    Patient states this medication made his legs hurt really bad.    VITALS:  Blood pressure (!) 100/49, pulse (!) 122, temperature 98 F (36.7 C), temperature source Axillary, resp. rate (!) 22, height 5\' 9"  (1.753 m), weight 73 kg (160 lb 14.4 oz), SpO2 98 %.  PHYSICAL EXAMINATION:  GENERAL:  83 y.o.-year-old patient lying in the bed with no acute distress.  EYES: Pupils equal, round, reactive to light and accommodation. No scleral icterus. Extraocular muscles intact.  HEENT: Head atraumatic, normocephalic. Oropharynx and nasopharynx clear. Dry mucosa. NECK:  Supple, no jugular venous distention. No thyroid enlargement, no tenderness.  LUNGS: Normal breath sounds bilaterally, no wheezing, rales,rhonchi or crepitation. No use of accessory muscles of respiration.  CARDIOVASCULAR: S1, S2 normal. No murmurs, rubs, or gallops.  ABDOMEN: Soft, nontender, nondistended. Bowel sounds present. No organomegaly or mass.  EXTREMITIES: right leg- redness, non palpable pulses, superficial ulceration , cyanosis, or clubbing. Both legs red. NEUROLOGIC: pt is alert, speaks few words in confusion, but not following commands. PSYCHIATRIC: The patient is alert and some confused.  SKIN: as mentioned above.   Physical Exam LABORATORY PANEL:   CBC  Recent Labs Lab 07/24/16 0446  WBC 19.8*  HGB 10.2*  HCT 30.1*  PLT 1,256*    ------------------------------------------------------------------------------------------------------------------  Chemistries   Recent Labs Lab 07/23/16 1731 07/24/16 0446  NA 132* 135  K 3.7 3.3*  CL 86* 93*  CO2 24 24  GLUCOSE 144* 96  BUN 146* 133*  CREATININE 7.82* 6.58*  CALCIUM 8.3* 7.7*  AST 71*  --   ALT 29  --   ALKPHOS 82  --   BILITOT 0.4  --    ------------------------------------------------------------------------------------------------------------------  Cardiac Enzymes  Recent Labs Lab 07/23/16 1731  TROPONINI 0.08*   ------------------------------------------------------------------------------------------------------------------  RADIOLOGY:  US Renal  Result Date: 07/23/2016 CLINICAL DATA:  83 y/o  M; acute renal failure. EXAM: RENAL / URINARY TRACT ULTRASOUND COMPLETE COMPARISON:  04/21/2004 CT of abdomen and pelvis. FINDINGS: Right Kidney: Length: 9.9 cm. Mildly increased echogenicity when compared with the liver parenchyma. Upper pole simple appearing cyst measuring up to 1.4 cm. Left Kidney: Length: 9.9 cm. Simple appearing cysts within the mid kidney measuring 2.9 in 2.3 cm. Mildly increased echogenicity of the renal parenchyma when compared to the spleen. Bladder: Appears normal for degree of bladder distention. IMPRESSION: Mild increase in kidney echogenicity bilaterally is compatible with medical renal disease. No hydronephrosis. Electronically Signed   By: Kristine Garbe M.D.   On: 07/23/2016 22:18   Dg Chest Portable 1 View  Result Date: 07/23/2016 CLINICAL DATA:  Weakness.  Acute renal injury. EXAM: PORTABLE CHEST 1 VIEW COMPARISON:  03/27/2016 FINDINGS: Heart size remains stable. Mild bilateral scarring again noted. No evidence of pulmonary infiltrate or edema. No evidence of pneumothorax or pleural effusion. IMPRESSION: No active disease. Electronically Signed   By: Earle Gell M.D.   On: 07/23/2016 18:36  ASSESSMENT AND  PLAN:   Active Problems:   Sepsis ([place])   Pressure injury of skin  1. Sepsis, possibly secondary to cellulitis, lower extremity wounds, bacteremia -Admit to general medical floor -IV Zosyn and Vanco -Follow-up blood, urine, wound cultures -Follow lactate -IV fluid hydration -Wound consult, vascular consult and ID consult. -Pain control 2. AKI -IV fluid hydration and repeat BMP in a.m. -Hold nephrotoxic medications (Cozaar, HCTZ) -Renal ultrasound showed medical renal disease -Monitor Urine Output Pl., Foley catheter evidence of urinary retention - nephrology consult. 3. Thrombocytosis, acute on chronic possibly reactive -Repeat CBC in a.m.  4. Elevated troponin -Likely secondary to demand ischemia  follow on telemetry and trend troponins 5. PAD -appreciated vascular consult- suggest conservative management for now. -Continue Eliquis and Plavix  6. History of hyperlipidemia -Continue Lipitor 7. History of asthma and allergies -Continue Allegra, fluticasone, Advair, Spiriva 8. History of GERD-continue Prilosec 9. History of hypertension -Hold Cozaar and HCTZ      All the records are reviewed and case discussed with Care Management/Social Workerr. Management plans discussed with the patient, family and they are in agreement.  CODE STATUS: full  TOTAL TIME TAKING CARE OF THIS PATIENT: 35 minutes.     POSSIBLE D/C IN 2-3 DAYS, DEPENDING ON CLINICAL CONDITION.   Vaughan Basta M.D on 07/24/2016   Between 7am to 6pm - Pager - [phone]  After 6pm go to [email or web address] - password [email or web address] [facility]  Office  [phone]  CC: Primary care physician; Adin Hector, MD  Note: This dictation was prepared with Dragon dictation along with smaller phrase technology. Any transcriptional errors that result from this process are unintentional.

## 2016-07-25 ENCOUNTER — Inpatient Hospital Stay: Payer: Medicare Other

## 2016-07-25 ENCOUNTER — Inpatient Hospital Stay (HOSPITAL_COMMUNITY)
Admission: EM | Admit: 2016-07-25 | Discharge: 2016-07-25 | Disposition: A | Discharge status: Home / Self Care | Payer: Medicare Other | Source: Home / Self Care | Attending: Infectious Diseases | Admitting: Infectious Diseases

## 2016-07-25 ENCOUNTER — Ambulatory Visit (INDEPENDENT_AMBULATORY_CARE_PROVIDER_SITE_OTHER): Payer: Medicare Other | Admitting: Vascular Surgery

## 2016-07-25 DIAGNOSIS — R509 Fever, unspecified: Secondary | ICD-10-CM

## 2016-07-25 LAB — BASIC METABOLIC PANEL
Anion gap: 17 — ABNORMAL HIGH (ref 5–15)
BUN: 116 mg/dL — AB (ref 6–20)
CALCIUM: 8.1 mg/dL — AB (ref 8.9–10.3)
CO2: 23 mmol/L (ref 22–32)
CREATININE: 5.11 mg/dL — AB (ref 0.61–1.24)
Chloride: 103 mmol/L (ref 101–111)
GFR calc non Af Amer: 9 mL/min — ABNORMAL LOW (ref >60)
GFR, EST AFRICAN AMERICAN: 10 mL/min — AB (ref >60)
Glucose, Bld: 88 mg/dL (ref 65–99)
Potassium: 3 mmol/L — ABNORMAL LOW (ref 3.5–5.1)
SODIUM: 143 mmol/L (ref 135–145)

## 2016-07-25 LAB — CBC
HCT: 28.7 % — ABNORMAL LOW (ref 40.0–52.0)
Hemoglobin: 10 g/dL — ABNORMAL LOW (ref 13.0–18.0)
MCH: 31.8 pg (ref 26.0–34.0)
MCHC: 34.8 g/dL (ref 32.0–36.0)
MCV: 91.5 fL (ref 80.0–100.0)
PLATELETS: 1107 10*3/uL — AB (ref 150–440)
RBC: 3.13 MIL/uL — AB (ref 4.40–5.90)
RDW: 15.2 % — AB (ref 11.5–14.5)
WBC: 16.6 10*3/uL — AB (ref 3.8–10.6)

## 2016-07-25 LAB — URINALYSIS, ROUTINE W REFLEX MICROSCOPIC
BILIRUBIN URINE: NEGATIVE
Bacteria, UA: NONE SEEN
Glucose, UA: NEGATIVE mg/dL
Ketones, ur: NEGATIVE mg/dL
LEUKOCYTES UA: NEGATIVE
Nitrite: NEGATIVE
PH: 5 (ref 5.0–8.0)
Protein, ur: NEGATIVE mg/dL
SPECIFIC GRAVITY, URINE: 1.013 (ref 1.005–1.030)

## 2016-07-25 LAB — MAGNESIUM: MAGNESIUM: 2.3 mg/dL (ref 1.7–2.4)

## 2016-07-25 LAB — TROPONIN I: Troponin I: 0.28 ng/mL (ref <0.03)

## 2016-07-25 MED ORDER — ENSURE ENLIVE PO LIQD
237.0000 mL | Freq: Three times a day (TID) | ORAL | Status: DC
  Administered 2016-07-25 – 2016-07-31 (×10): 237 mL via ORAL

## 2016-07-25 MED ORDER — POTASSIUM CHLORIDE CRYS ER 20 MEQ PO TBCR
20.0000 meq | EXTENDED_RELEASE_TABLET | Freq: Once | ORAL | Status: AC
  Administered 2016-07-25: 20 meq via ORAL
  Filled 2016-07-25: qty 1

## 2016-07-25 MED ORDER — POTASSIUM CHLORIDE 20 MEQ PO PACK
20.0000 meq | PACK | Freq: Once | ORAL | Status: AC
  Administered 2016-07-26: 20 meq via ORAL
  Filled 2016-07-25: qty 1

## 2016-07-25 MED ORDER — LORAZEPAM 2 MG/ML IJ SOLN
1.0000 mg | Freq: Once | INTRAMUSCULAR | Status: AC
  Administered 2016-07-25: 1 mg via INTRAVENOUS
  Filled 2016-07-25: qty 1

## 2016-07-25 MED ORDER — MORPHINE SULFATE (PF) 2 MG/ML IV SOLN
2.0000 mg | Freq: Once | INTRAVENOUS | Status: AC
  Administered 2016-07-25: 06:00:00 2 mg via INTRAVENOUS
  Filled 2016-07-25: qty 1

## 2016-07-25 MED ORDER — ALBUTEROL SULFATE (2.5 MG/3ML) 0.083% IN NEBU
2.5000 mg | INHALATION_SOLUTION | Freq: Two times a day (BID) | RESPIRATORY_TRACT | Status: DC
  Administered 2016-07-25 – 2016-07-31 (×10): 2.5 mg via RESPIRATORY_TRACT
  Filled 2016-07-25 (×12): qty 3

## 2016-07-25 NOTE — Progress Notes (Signed)
Reported to Dr Judd Gaudier that patient is in and out of bigemeny and trigemeny with frequent PVCs. MD acknowledged, ordered blood magnesium level.

## 2016-07-25 NOTE — Progress Notes (Signed)
Initial Nutrition Assessment  DOCUMENTATION CODES:   Severe malnutrition in context of acute illness/injury  INTERVENTION:  Recommend liberalizing diet from Heart Healthy to Regular in setting of malnutrition and advanced age.  Provide Ensure Enlive po TID with meals, each supplement provides 350 kcal and 20 grams of protein.  NUTRITION DIAGNOSIS:   Malnutrition (Severe) related to acute illness as evidenced by 12.5 percent weight loss over 2 months, moderate depletions of muscle mass, moderate depletion of body fat, moderate to severe fluid accumulation.  GOAL:   Patient will meet greater than or equal to 90% of their needs  MONITOR:   PO intake, Supplement acceptance, Labs, I & O's, Weight trends  REASON FOR ASSESSMENT:   Low Braden    ASSESSMENT:   83 year old male with PMHx PAD, HLD, GERD, HTN, CKD, found to have sepsis secondary to cellulitis, lower extremity wounds, bacteremia, and AKI.   Spoke with patient at bedside, but he was only able to provide limited information in setting of confusion. Patient is reporting that he is hungry. Denies N/V or abdominal pain. Patient requires assistance with meals.  Per chart patient has lost 23 lbs (12.5% body weight) over 2 months, which is significant for time frame.   Meal Completion: 50% per chart  Medications reviewed and include: Unasyn, Vitamin D 1000 units each morning, levothyroxine, Ocuvite-Lutein BID, pantoprazole, vitamin B12 2500 micrograms daily, vitamin E 400 units daily, NS @ 152ml/hr.   Labs reviewed: Potassium 3, BUN 116, Creatinine 5.11, Anion gap 17.   Nutrition-Focused physical exam completed. Findings are moderate fat depletion, moderate-severe muscle depletion, and moderate edema.   Diet Order:  Diet regular Room service appropriate? Yes; Fluid consistency: Thin  Skin:  Wound (see comment) (Stg II to buttocks and foot, [place] to heel)  Last BM:  07/23/2016  Height:   Ht Readings from Last 1 Encounters:   07/24/16 5\' 9"  (1.753 m)    Weight:   Wt Readings from Last 1 Encounters:  07/24/16 160 lb 14.4 oz (73 kg)    Ideal Body Weight:  72.7 kg  BMI:  Body mass index is 23.76 kg/m.  Estimated Nutritional Needs:   Kcal:  [id number] (MSJ x 1.2-1.4)  Protein:  88-102 grams (1.2-1.4 grams/kg)  Fluid:  1.8 L/day (25 ml/kg)  EDUCATION NEEDS:   Education needs no appropriate at this time  Willey Blade, MS, RD, LDN Pager: [phone] After Hours Pager: [phone]

## 2016-07-25 NOTE — Progress Notes (Signed)
[place] INFECTIOUS DISEASE PROGRESS NOTE Date of Admission:  07/23/2016     ID: Andrew [name] is a 83 y.o. male with  PAD, LE ulceration enterococcal bacteremia Active Problems:   Sepsis ([place])   Pressure injury of skin   Subjective: No fevers but more lethargic  ROS  Unable to obtain   Medications:  Antibiotics Given (last 72 hours)    Date/Time Action Medication Dose Rate   07/24/16 1013 Given   piperacillin-tazobactam (ZOSYN) IVPB 3.375 g 3.375 g 12.5 mL/hr   07/24/16 1830 Given   Ampicillin-Sulbactam (UNASYN) 3 g in sodium chloride 0.9 % 100 mL IVPB 3 g 200 mL/hr     . albuterol  2.5 mg Nebulization BID  . ampicillin-sulbactam (UNASYN) IV  3 g Intravenous Q24H  . apixaban  2.5 mg Oral BID  . aspirin EC  81 mg Oral Daily  . atorvastatin  20 mg Oral QPM  . Chlorhexidine Gluconate Cloth  6 each Topical Q0600  . cholecalciferol  1,000 Units Oral BH-q7a  . clopidogrel  75 mg Oral Daily  . feeding supplement (ENSURE ENLIVE)  237 mL Oral TID WC  . fluticasone  2 spray Each Nare Daily  . levothyroxine  75 mcg Oral QAC breakfast  . loratadine  10 mg Oral Daily  . mometasone-formoterol  2 puff Inhalation BID  . multivitamin-lutein  1 capsule Oral BID  . mupirocin ointment  1 application Nasal BID  . pantoprazole  40 mg Oral QAC breakfast  . polyvinyl alcohol  1-2 drop Both Eyes Daily  . potassium chloride  20 mEq Oral Once  . tiotropium  18 mcg Inhalation Daily  . vitamin B-12  2,500 mcg Oral BH-q7a  . vitamin E  400 Units Oral BH-q7a    Objective: Vital signs in last 24 hours: Temp:  [98.5 F (36.9 C)-98.7 F (37.1 C)] 98.7 F (37.1 C) (01/23 1100) Pulse Rate:  [88-101] 88 (01/23 1100) Resp:  [18] 18 (01/23 1100) BP: (103-110)/(44-54) 108/52 (01/23 1100) SpO2:  [96 %-99 %] 96 % (01/23 1100) Constitutional:lethargic, frail HENT: anicteric Mouth/Throat: Oropharynx is clear and moist. No oropharyngeal exudate.  Cardiovascular: Normal rate,- distant  HS Pulmonary/Chest: Effort normal and breath sounds normal. No respiratory distress. He has no wheezes.  Abdominal: Soft. Bowel sounds are normal. He exhibits no distension. There is no tenderness.  Lymphadenopathy: He has no cervical adenopathy.  Neurological: He is alert and interactive Vascular RLE very cool, non palpable pulse, LLE with mod coolness, non palpable pulse Skin: R leg with extensive ulceration, Post achilles tendon exposed with eschar, lateral malleous with eschar.  LLE with shallow ulcerations  Psychiatric: lethargic Lab Results  Recent Labs  07/24/16 0446 07/25/16 0353  WBC 19.8* 16.6*  HGB 10.2* 10.0*  HCT 30.1* 28.7*  NA 135 143  K 3.3* 3.0*  CL 93* 103  CO2 24 23  BUN 133* 116*  CREATININE 6.58* 5.11*    Microbiology: Results for orders placed or performed during the hospital encounter of 07/23/16  Blood culture (routine x 2)     Status: Abnormal (Preliminary result)   Collection Time: 07/23/16  6:49 PM  Result Value Ref Range Status   Specimen Description BLOOD LEFT HAND  Final   Special Requests   Final    BOTTLES DRAWN AEROBIC AND ANAEROBIC ANA13ML AER12ML   Culture  Setup Time   Final    GRAM POSITIVE COCCI ANAEROBIC BOTTLE ONLY CRITICAL RESULT CALLED TO, READ BACK BY AND VERIFIED WITH: CHRISTINE  KATSOUDAS ON 07/24/16 AT 1245 QSD Performed at [facility], [place] [address], [place], [place] [address]    Culture ENTEROCOCCUS FAECALIS (A)  Final   Report Status PENDING  Incomplete  Blood culture (routine x 2)     Status: Abnormal (Preliminary result)   Collection Time: 07/23/16  6:49 PM  Result Value Ref Range Status   Specimen Description BLOOD LEFT ASSIST CONTROL  Final   Special Requests   Final    BOTTLES DRAWN AEROBIC AND ANAEROBIC ANA13ML AER13ML   Culture  Setup Time   Final    GRAM POSITIVE COCCI IN BOTH AEROBIC AND ANAEROBIC BOTTLES CRITICAL VALUE NOTED.  VALUE IS CONSISTENT WITH PREVIOUSLY REPORTED AND CALLED VALUE. Performed  at [facility], [place] [address], [place], [place] [address]    Culture ENTEROCOCCUS FAECALIS (A)  Final   Report Status PENDING  Incomplete  Blood Culture ID Panel (Reflexed)     Status: Abnormal   Collection Time: 07/23/16  6:49 PM  Result Value Ref Range Status   Enterococcus species DETECTED (A) NOT DETECTED Final    Comment: CRITICAL RESULT CALLED TO, READ BACK BY AND VERIFIED WITH: CHRISTINE KATSOUDAS ON 07/24/16 AT 1245 QSD    Vancomycin resistance NOT DETECTED NOT DETECTED Final   Listeria monocytogenes NOT DETECTED NOT DETECTED Final   Staphylococcus species NOT DETECTED NOT DETECTED Final   Staphylococcus aureus NOT DETECTED NOT DETECTED Final   Streptococcus species NOT DETECTED NOT DETECTED Final   Streptococcus agalactiae NOT DETECTED NOT DETECTED Final   Streptococcus pneumoniae NOT DETECTED NOT DETECTED Final   Streptococcus pyogenes NOT DETECTED NOT DETECTED Final   Acinetobacter baumannii NOT DETECTED NOT DETECTED Final   Enterobacteriaceae species NOT DETECTED NOT DETECTED Final   Enterobacter cloacae complex NOT DETECTED NOT DETECTED Final   Escherichia coli NOT DETECTED NOT DETECTED Final   Klebsiella oxytoca NOT DETECTED NOT DETECTED Final   Klebsiella pneumoniae NOT DETECTED NOT DETECTED Final   Proteus species NOT DETECTED NOT DETECTED Final   Serratia marcescens NOT DETECTED NOT DETECTED Final   Haemophilus influenzae NOT DETECTED NOT DETECTED Final   Neisseria meningitidis NOT DETECTED NOT DETECTED Final   Pseudomonas aeruginosa NOT DETECTED NOT DETECTED Final   Candida albicans NOT DETECTED NOT DETECTED Final   Candida glabrata NOT DETECTED NOT DETECTED Final   Candida krusei NOT DETECTED NOT DETECTED Final   Candida parapsilosis NOT DETECTED NOT DETECTED Final   Candida tropicalis NOT DETECTED NOT DETECTED Final  MRSA PCR Screening     Status: Abnormal   Collection Time: 07/24/16  4:49 AM  Result Value Ref Range Status   MRSA by PCR POSITIVE (A)  NEGATIVE Final    Comment:        The GeneXpert MRSA Assay (FDA approved for NASAL specimens only), is one component of a comprehensive MRSA colonization surveillance program. It is not intended to diagnose MRSA infection nor to guide or monitor treatment for MRSA infections. RESULT CALLED TO, READ BACK BY AND VERIFIED WITH: PHYLLIS KING ON 07/24/16 AT 0649 BY [facility]   Culture, blood (single) w Reflex to ID Panel     Status: None (Preliminary result)   Collection Time: 07/24/16  2:49 PM  Result Value Ref Range Status   Specimen Description BLOOD LEFT AC  Final   Special Requests   Final    BOTTLES DRAWN AEROBIC AND ANAEROBIC AER 10ML ANA 14ML   Culture NO GROWTH < 24 HOURS  Final   Report  Status PENDING  Incomplete    Studies/Results: US Renal  Result Date: 07/23/2016 CLINICAL DATA:  83 y/o  M; acute renal failure. EXAM: RENAL / URINARY TRACT ULTRASOUND COMPLETE COMPARISON:  04/21/2004 CT of abdomen and pelvis. FINDINGS: Right Kidney: Length: 9.9 cm. Mildly increased echogenicity when compared with the liver parenchyma. Upper pole simple appearing cyst measuring up to 1.4 cm. Left Kidney: Length: 9.9 cm. Simple appearing cysts within the mid kidney measuring 2.9 in 2.3 cm. Mildly increased echogenicity of the renal parenchyma when compared to the spleen. Bladder: Appears normal for degree of bladder distention. IMPRESSION: Mild increase in kidney echogenicity bilaterally is compatible with medical renal disease. No hydronephrosis. Electronically Signed   By: Kristine Garbe M.D.   On: 07/23/2016 22:18   Dg Chest Portable 1 View  Result Date: 07/23/2016 CLINICAL DATA:  Weakness.  Acute renal injury. EXAM: PORTABLE CHEST 1 VIEW COMPARISON:  03/27/2016 FINDINGS: Heart size remains stable. Mild bilateral scarring again noted. No evidence of pulmonary infiltrate or edema. No evidence of pneumothorax or pleural effusion. IMPRESSION: No active disease. Electronically Signed   By: Earle Gell M.D.   On: 07/23/2016 18:36    Assessment/Plan: Enterococcal bacteremia- UA UCX not done on admit so could be urinary source repeat bcx neg 1/22 TTE - neg for vegetation  Cont unasyn for now but if fu bcx neg can dc on oral augmentin for a 14 total days of treatmet  LE ulcers in setting PAD - severe disease on R, less so on L. Not obviously infected but has eschars on R.  Not a great candidate for surgery per Dr Lucky Cowboy. Would continue current measures and fu vascular Thank you very much for the consult. Will follow with you.  Dontavia Brand P   07/25/2016, 4:40 PM

## 2016-07-25 NOTE — Progress Notes (Signed)
PT Cancellation Note  Patient Details Name: Andrew [name] MRN: [id number] DOB: 1923-04-04   Cancelled Treatment:    Reason Eval/Treat Not Completed: Patient at procedure or test/unavailable Pt out of room for [place], will try back at a later date.  Kreg Shropshire, DPT 07/25/2016, 4:34 PM

## 2016-07-25 NOTE — Progress Notes (Signed)
Subjective:  Patient Known to our practice from outpatient. He is followed by Dr. Holley Raring for CK D   he is admitted for altered mental status which is thought to be secondary to sepsis from cellulitis of lower extremity wounds He is also found to have severe acute kidney injury His baseline creatinine appears to be 1.10 from December 3 Admission creatinine is 7.82  Today's creatinine has slightly improved to 5.11, BUN is 116  Patient is more awake compared to yesterday, he is able to answer some questions.  Blood cultures are positive for enterococcus  Objective:  Vital signs in last 24 hours:  Temp:  [98 F (36.7 C)-98.7 F (37.1 C)] 98.7 F (37.1 C) (01/23 1100) Pulse Rate:  [88-122] 88 (01/23 1100) Resp:  [18-22] 18 (01/23 1100) BP: (100-110)/(44-54) 108/52 (01/23 1100) SpO2:  [96 %-99 %] 96 % (01/23 1100)  Weight change:  Filed Weights   07/23/16 1725 07/24/16 0356  Weight: 73.3 kg (161 lb 11.2 oz) 73 kg (160 lb 14.4 oz)    Intake/Output:    Intake/Output Summary (Last 24 hours) at 07/25/16 1349 Last data filed at 07/25/16 0342  Gross per 24 hour  Intake          2368.33 ml  Output                0 ml  Net          2368.33 ml     Physical Exam: General: Critically ill-appearing   HEENT Dry oral mucous membranes   Neck Supple  Pulm/lungs Shallow breathing effort, clear anteriorly and laterally   CVS/Heart Irregular   Abdomen:  Soft, nontender   Extremities: Redness, edema over bilateral lower extremities, foul smelling wounds   Neurologic: Mumbling, able to answer some questions  Skin: Erythema over lower abdominal area           Basic Metabolic Panel:   Recent Labs Lab 07/23/16 1731 07/24/16 0446 07/25/16 0353  NA 132* 135 143  K 3.7 3.3* 3.0*  CL 86* 93* 103  CO2 24 24 23   GLUCOSE 144* 96 88  BUN 146* 133* 116*  CREATININE 7.82* 6.58* 5.11*  CALCIUM 8.3* 7.7* 8.1*     CBC:  Recent Labs Lab 07/23/16 1731 07/24/16 0446 07/25/16 0353   WBC 23.9* 19.8* 16.6*  NEUTROABS 21.6*  --   --   HGB 11.9* 10.2* 10.0*  HCT 34.7* 30.1* 28.7*  MCV 90.0 91.0 91.5  PLT 1,457* 1,256* 1,107*      Microbiology:  Recent Results (from the past 720 hour(s))  Urine culture     Status: Abnormal   Collection Time: 07/11/16  9:00 AM  Result Value Ref Range Status   Specimen Description URINE, RANDOM  Final   Special Requests NONE  Final   Culture MULTIPLE SPECIES PRESENT, SUGGEST RECOLLECTION (A)  Final   Report Status 07/13/2016 FINAL  Final  Blood culture (routine x 2)     Status: Abnormal (Preliminary result)   Collection Time: 07/23/16  6:49 PM  Result Value Ref Range Status   Specimen Description BLOOD LEFT HAND  Final   Special Requests   Final    BOTTLES DRAWN AEROBIC AND ANAEROBIC ANA13ML AER12ML   Culture  Setup Time   Final    GRAM POSITIVE COCCI ANAEROBIC BOTTLE ONLY CRITICAL RESULT CALLED TO, READ BACK BY AND VERIFIED WITH: CHRISTINE KATSOUDAS ON 07/24/16 AT [id number] QSD Performed at [facility], [address] [place], [place]  [address]    Culture ENTEROCOCCUS FAECALIS (A)  Final   Report Status PENDING  Incomplete  Blood culture (routine x 2)     Status: Abnormal (Preliminary result)   Collection Time: 07/23/16  6:49 PM  Result Value Ref Range Status   Specimen Description BLOOD LEFT ASSIST CONTROL  Final   Special Requests   Final    BOTTLES DRAWN AEROBIC AND ANAEROBIC ANA13ML AER13ML   Culture  Setup Time   Final    GRAM POSITIVE COCCI IN BOTH AEROBIC AND ANAEROBIC BOTTLES CRITICAL VALUE NOTED.  VALUE IS CONSISTENT WITH PREVIOUSLY REPORTED AND CALLED VALUE. Performed at [facility], [place] [address] [place], [place] [address]    Culture ENTEROCOCCUS FAECALIS (A)  Final   Report Status PENDING  Incomplete  Blood Culture ID Panel (Reflexed)     Status: Abnormal   Collection Time: 07/23/16  6:49 PM  Result Value Ref Range Status   Enterococcus species DETECTED (A) NOT DETECTED Final    Comment:  CRITICAL RESULT CALLED TO, READ BACK BY AND VERIFIED WITH: CHRISTINE KATSOUDAS ON 07/24/16 AT 1245 QSD    Vancomycin resistance NOT DETECTED NOT DETECTED Final   Listeria monocytogenes NOT DETECTED NOT DETECTED Final   Staphylococcus species NOT DETECTED NOT DETECTED Final   Staphylococcus aureus NOT DETECTED NOT DETECTED Final   Streptococcus species NOT DETECTED NOT DETECTED Final   Streptococcus agalactiae NOT DETECTED NOT DETECTED Final   Streptococcus pneumoniae NOT DETECTED NOT DETECTED Final   Streptococcus pyogenes NOT DETECTED NOT DETECTED Final   Acinetobacter baumannii NOT DETECTED NOT DETECTED Final   Enterobacteriaceae species NOT DETECTED NOT DETECTED Final   Enterobacter cloacae complex NOT DETECTED NOT DETECTED Final   Escherichia coli NOT DETECTED NOT DETECTED Final   Klebsiella oxytoca NOT DETECTED NOT DETECTED Final   Klebsiella pneumoniae NOT DETECTED NOT DETECTED Final   Proteus species NOT DETECTED NOT DETECTED Final   Serratia marcescens NOT DETECTED NOT DETECTED Final   Haemophilus influenzae NOT DETECTED NOT DETECTED Final   Neisseria meningitidis NOT DETECTED NOT DETECTED Final   Pseudomonas aeruginosa NOT DETECTED NOT DETECTED Final   Candida albicans NOT DETECTED NOT DETECTED Final   Candida glabrata NOT DETECTED NOT DETECTED Final   Candida krusei NOT DETECTED NOT DETECTED Final   Candida parapsilosis NOT DETECTED NOT DETECTED Final   Candida tropicalis NOT DETECTED NOT DETECTED Final  MRSA PCR Screening     Status: Abnormal   Collection Time: 07/24/16  4:49 AM  Result Value Ref Range Status   MRSA by PCR POSITIVE (A) NEGATIVE Final    Comment:        The GeneXpert MRSA Assay (FDA approved for NASAL specimens only), is one component of a comprehensive MRSA colonization surveillance program. It is not intended to diagnose MRSA infection nor to guide or monitor treatment for MRSA infections. RESULT CALLED TO, READ BACK BY AND VERIFIED WITH: PHYLLIS  KING ON 07/24/16 AT 0649 BY [facility]   Culture, blood (single) w Reflex to ID Panel     Status: None (Preliminary result)   Collection Time: 07/24/16  2:49 PM  Result Value Ref Range Status   Specimen Description BLOOD LEFT AC  Final   Special Requests   Final    BOTTLES DRAWN AEROBIC AND ANAEROBIC AER 10ML ANA 14ML   Culture NO GROWTH < 24 HOURS  Final   Report Status PENDING  Incomplete    Coagulation Studies: No results for input(s): LABPROT, INR in the last  72 hours.  Urinalysis:  Recent Labs  07/25/16 1100  COLORURINE YELLOW*  LABSPEC 1.013  PHURINE 5.0  GLUCOSEU NEGATIVE  HGBUR MODERATE*  BILIRUBINUR NEGATIVE  KETONESUR NEGATIVE  PROTEINUR NEGATIVE  NITRITE NEGATIVE  LEUKOCYTESUR NEGATIVE      Imaging: US Renal  Result Date: 07/23/2016 CLINICAL DATA:  83 y/o  M; acute renal failure. EXAM: RENAL / URINARY TRACT ULTRASOUND COMPLETE COMPARISON:  04/21/2004 CT of abdomen and pelvis. FINDINGS: Right Kidney: Length: 9.9 cm. Mildly increased echogenicity when compared with the liver parenchyma. Upper pole simple appearing cyst measuring up to 1.4 cm. Left Kidney: Length: 9.9 cm. Simple appearing cysts within the mid kidney measuring 2.9 in 2.3 cm. Mildly increased echogenicity of the renal parenchyma when compared to the spleen. Bladder: Appears normal for degree of bladder distention. IMPRESSION: Mild increase in kidney echogenicity bilaterally is compatible with medical renal disease. No hydronephrosis. Electronically Signed   By: Kristine Garbe M.D.   On: 07/23/2016 22:18   Dg Chest Portable 1 View  Result Date: 07/23/2016 CLINICAL DATA:  Weakness.  Acute renal injury. EXAM: PORTABLE CHEST 1 VIEW COMPARISON:  03/27/2016 FINDINGS: Heart size remains stable. Mild bilateral scarring again noted. No evidence of pulmonary infiltrate or edema. No evidence of pneumothorax or pleural effusion. IMPRESSION: No active disease. Electronically Signed   By: Earle Gell M.D.   On:  07/23/2016 18:36     Medications:   . sodium chloride 100 mL/hr at 07/25/16 0508   . albuterol  2.5 mg Nebulization BID  . ampicillin-sulbactam (UNASYN) IV  3 g Intravenous Q24H  . apixaban  2.5 mg Oral BID  . aspirin EC  81 mg Oral Daily  . atorvastatin  20 mg Oral QPM  . Chlorhexidine Gluconate Cloth  6 each Topical Q0600  . cholecalciferol  1,000 Units Oral BH-q7a  . clopidogrel  75 mg Oral Daily  . feeding supplement (ENSURE ENLIVE)  237 mL Oral TID WC  . fluticasone  2 spray Each Nare Daily  . levothyroxine  75 mcg Oral QAC breakfast  . loratadine  10 mg Oral Daily  . mometasone-formoterol  2 puff Inhalation BID  . multivitamin-lutein  1 capsule Oral BID  . mupirocin ointment  1 application Nasal BID  . pantoprazole  40 mg Oral QAC breakfast  . polyvinyl alcohol  1-2 drop Both Eyes Daily  . tiotropium  18 mcg Inhalation Daily  . vitamin B-12  2,500 mcg Oral BH-q7a  . vitamin E  400 Units Oral BH-q7a   acetaminophen **OR** acetaminophen, albuterol, bisacodyl, magnesium citrate, naphazoline-glycerin, ondansetron **OR** ondansetron (ZOFRAN) IV, oxyCODONE, senna-docusate, traMADol  Assessment/ Plan:  83 y.o. male With asthma, chronic kidney disease, hypertension, peripheral vascular disease, nursing home resident at [facility]  1. Acute kidney injury 2. Chronic kidney disease stage III 3. Sepsis from lower extremity wounds, enterococcus bacteremia 4. Hypokalemia 5. Lactic acidosis  Acute kidney injury is likely secondary to severe dehydration leading to possibly ATN. Sepsis, bacteremia and cellulitis are also playing a role. Agree with gentle IV fluid resuscitation Serum creatinine and BUN continue to improve. Ultrasound is negative for obstruction Supportive care. Will follow   LOS: 1 Manraj Yeo 1/23/20181:49 PM

## 2016-07-25 NOTE — Progress Notes (Addendum)
Notified Dr Judd Gaudier that patient had a 4 beat run of multi focal PVCs. MD acknowledged, no new orders.

## 2016-07-25 NOTE — Progress Notes (Signed)
[facility] [place] NAME: Andrew [name]    MR#:  [id number]  DATE OF BIRTH:  [birth year]  SUBJECTIVE:  CHIEF COMPLAINT:   Chief Complaint  Patient presents with  . Abnormal Lab   came with confusion, he have chronic vascular issues on his leg- have confusion. Found to have cellulitis and bacteremia. Also have acute renal failure.   Pt remains confused today.  REVIEW OF SYSTEMS:  Because of confusion, not giving much details. ROS  DRUG ALLERGIES:   Allergies  Allergen Reactions  . Keflex [Cephalexin] Other (See Comments)    Patient states this medication made his legs hurt really bad.    VITALS:  Blood pressure (!) 108/52, pulse 88, temperature 98.7 F (37.1 C), temperature source Axillary, resp. rate 18, height 5\' 9"  (1.753 m), weight 73 kg (160 lb 14.4 oz), SpO2 96 %.  PHYSICAL EXAMINATION:  GENERAL:  83 y.o.-year-old patient lying in the bed with no acute distress.  EYES: Pupils equal, round, reactive to light and accommodation. No scleral icterus. Extraocular muscles intact.  HEENT: Head atraumatic, normocephalic. Oropharynx and nasopharynx clear. Dry mucosa. NECK:  Supple, no jugular venous distention. No thyroid enlargement, no tenderness.  LUNGS: Normal breath sounds bilaterally, no wheezing, rales,rhonchi or crepitation. No use of accessory muscles of respiration.  CARDIOVASCULAR: S1, S2 normal. No murmurs, rubs, or gallops.  ABDOMEN: Soft, nontender, nondistended. Bowel sounds present. No organomegaly or mass.  EXTREMITIES: right leg- redness, non palpable pulses, superficial ulceration , cyanosis, or clubbing. Both legs red. NEUROLOGIC: pt is alert, speaks few words in confusion, but not following commands. PSYCHIATRIC: The patient is alert and some confused.  SKIN: as mentioned above.   Physical Exam LABORATORY PANEL:   CBC  Recent Labs Lab 07/25/16 0353  WBC 16.6*  HGB 10.0*  HCT 28.7*  PLT 1,107*    ------------------------------------------------------------------------------------------------------------------  Chemistries   Recent Labs Lab 07/23/16 1731  07/25/16 0353  NA 132*  < > 143  K 3.7  < > 3.0*  CL 86*  < > 103  CO2 24  < > 23  GLUCOSE 144*  < > 88  BUN 146*  < > 116*  CREATININE 7.82*  < > 5.11*  CALCIUM 8.3*  < > 8.1*  AST 71*  --   --   ALT 29  --   --   ALKPHOS 82  --   --   BILITOT 0.4  --   --   < > = values in this interval not displayed. ------------------------------------------------------------------------------------------------------------------  Cardiac Enzymes  Recent Labs Lab 07/23/16 1731  TROPONINI 0.08*   ------------------------------------------------------------------------------------------------------------------  RADIOLOGY:  US Renal  Result Date: 07/23/2016 CLINICAL DATA:  83 y/o  M; acute renal failure. EXAM: RENAL / URINARY TRACT ULTRASOUND COMPLETE COMPARISON:  04/21/2004 CT of abdomen and pelvis. FINDINGS: Right Kidney: Length: 9.9 cm. Mildly increased echogenicity when compared with the liver parenchyma. Upper pole simple appearing cyst measuring up to 1.4 cm. Left Kidney: Length: 9.9 cm. Simple appearing cysts within the mid kidney measuring 2.9 in 2.3 cm. Mildly increased echogenicity of the renal parenchyma when compared to the spleen. Bladder: Appears normal for degree of bladder distention. IMPRESSION: Mild increase in kidney echogenicity bilaterally is compatible with medical renal disease. No hydronephrosis. Electronically Signed   By: Kristine Garbe M.D.   On: 07/23/2016 22:18   Dg Chest Portable 1 View  Result Date: 07/23/2016 CLINICAL DATA:  Weakness.  Acute renal injury. EXAM: PORTABLE  CHEST 1 VIEW COMPARISON:  03/27/2016 FINDINGS: Heart size remains stable. Mild bilateral scarring again noted. No evidence of pulmonary infiltrate or edema. No evidence of pneumothorax or pleural effusion. IMPRESSION: No  active disease. Electronically Signed   By: Earle Gell M.D.   On: 07/23/2016 18:36    ASSESSMENT AND PLAN:   Active Problems:   Sepsis ([place])   Pressure injury of skin  1. Sepsis, possibly secondary to cellulitis, lower extremity wounds, bacteremia with enterococcus  -IV Zosyn and Vanco- switched to unasyn per ID. -Follow-up blood, urine, wound cultures -Follow lactate -IV fluid hydration -Wound consult, vascular consult and ID consult. -Pain control - get TTE as per ID. 2. AKI -IV fluid hydration and repeat BMP in a.m. -Hold nephrotoxic medications (Cozaar, HCTZ) -Renal ultrasound showed medical renal disease -Monitor Urine Output , Foley catheter evidence of urinary retention - nephrology consult appreciated. 3. Thrombocytosis, acute on chronic possibly reactive -Repeat CBC in a.m.  - If persists- may call hematology. 4. Elevated troponin -Likely secondary to demand ischemia  follow on telemetry and trend troponins 5. PAD -appreciated vascular consult- suggest conservative management for now. -Continue Eliquis and Plavix  6. History of hyperlipidemia -Continue Lipitor 7. History of asthma and allergies -Continue Allegra, fluticasone, Advair, Spiriva 8. History of GERD-continue Prilosec 9. History of hypertension -Hold Cozaar and HCTZ  10. Hypokalemia   Replace oral, this may be the reason for PVCs   Check Mg now.    All the records are reviewed and case discussed with Care Management/Social Workerr. Management plans discussed with the patient, family and they are in agreement.  CODE STATUS: full  TOTAL TIME TAKING CARE OF THIS PATIENT: 35 minutes.   Palliative care consult to decide goals of care.  POSSIBLE D/C IN 2-3 DAYS, DEPENDING ON CLINICAL CONDITION.   Vaughan Basta M.D on 07/25/2016   Between 7am to 6pm - Pager - [phone]  After 6pm go to [email or web address] - password [email or web address] [facility]  Office   [phone]  CC: Primary care physician; Adin Hector, MD  Note: This dictation was prepared with Dragon dictation along with smaller phrase technology. Any transcriptional errors that result from this process are unintentional.

## 2016-07-25 NOTE — Progress Notes (Signed)
Dr Judd Gaudier notified that patient had a 5 beat run of multi focal PVCs. MD acknowledged, no new orders.

## 2016-07-26 DIAGNOSIS — Z79899 Other long term (current) drug therapy: Secondary | ICD-10-CM

## 2016-07-26 DIAGNOSIS — I70238 Atherosclerosis of native arteries of right leg with ulceration of other part of lower right leg: Secondary | ICD-10-CM

## 2016-07-26 DIAGNOSIS — L97919 Non-pressure chronic ulcer of unspecified part of right lower leg with unspecified severity: Secondary | ICD-10-CM

## 2016-07-26 DIAGNOSIS — A419 Sepsis, unspecified organism: Secondary | ICD-10-CM

## 2016-07-26 DIAGNOSIS — Z7189 Other specified counseling: Secondary | ICD-10-CM

## 2016-07-26 DIAGNOSIS — N179 Acute kidney failure, unspecified: Secondary | ICD-10-CM

## 2016-07-26 DIAGNOSIS — I739 Peripheral vascular disease, unspecified: Secondary | ICD-10-CM

## 2016-07-26 DIAGNOSIS — Z87891 Personal history of nicotine dependence: Secondary | ICD-10-CM

## 2016-07-26 DIAGNOSIS — Z515 Encounter for palliative care: Secondary | ICD-10-CM

## 2016-07-26 DIAGNOSIS — I10 Essential (primary) hypertension: Secondary | ICD-10-CM

## 2016-07-26 DIAGNOSIS — Z66 Do not resuscitate: Secondary | ICD-10-CM

## 2016-07-26 DIAGNOSIS — N189 Chronic kidney disease, unspecified: Secondary | ICD-10-CM

## 2016-07-26 DIAGNOSIS — D473 Essential (hemorrhagic) thrombocythemia: Secondary | ICD-10-CM

## 2016-07-26 LAB — CBC
HCT: 30.4 % — ABNORMAL LOW (ref 40.0–52.0)
Hemoglobin: 10.4 g/dL — ABNORMAL LOW (ref 13.0–18.0)
MCH: 31.8 pg (ref 26.0–34.0)
MCHC: 34.3 g/dL (ref 32.0–36.0)
MCV: 92.7 fL (ref 80.0–100.0)
Platelets: 1120 10*3/uL (ref 150–440)
RBC: 3.28 MIL/uL — ABNORMAL LOW (ref 4.40–5.90)
RDW: 16 % — AB (ref 11.5–14.5)
WBC: 17 10*3/uL — ABNORMAL HIGH (ref 3.8–10.6)

## 2016-07-26 LAB — BASIC METABOLIC PANEL
Anion gap: 10 (ref 5–15)
BUN: 97 mg/dL — AB (ref 6–20)
CHLORIDE: 112 mmol/L — AB (ref 101–111)
CO2: 24 mmol/L (ref 22–32)
Calcium: 8.3 mg/dL — ABNORMAL LOW (ref 8.9–10.3)
Creatinine, Ser: 3.32 mg/dL — ABNORMAL HIGH (ref 0.61–1.24)
GFR calc Af Amer: 17 mL/min — ABNORMAL LOW (ref >60)
GFR calc non Af Amer: 15 mL/min — ABNORMAL LOW (ref >60)
GLUCOSE: 107 mg/dL — AB (ref 65–99)
POTASSIUM: 3.2 mmol/L — AB (ref 3.5–5.1)
Sodium: 146 mmol/L — ABNORMAL HIGH (ref 135–145)

## 2016-07-26 LAB — CULTURE, BLOOD (ROUTINE X 2)

## 2016-07-26 LAB — ECHOCARDIOGRAM COMPLETE
HEIGHTINCHES: 69 in
WEIGHTICAEL: 2574.4 [oz_av]

## 2016-07-26 MED ORDER — SODIUM CHLORIDE 0.9 % IV SOLN
3.0000 g | Freq: Two times a day (BID) | INTRAVENOUS | Status: DC
  Administered 2016-07-26 – 2016-07-27 (×4): 3 g via INTRAVENOUS
  Filled 2016-07-26 (×7): qty 3

## 2016-07-26 MED ORDER — HYDROXYUREA 500 MG PO CAPS
500.0000 mg | ORAL_CAPSULE | Freq: Every day | ORAL | Status: DC
  Administered 2016-07-26 – 2016-07-31 (×5): 500 mg via ORAL
  Filled 2016-07-26 (×5): qty 1

## 2016-07-26 MED ORDER — POTASSIUM CHLORIDE IN NACL 20-0.45 MEQ/L-% IV SOLN
INTRAVENOUS | Status: DC
  Administered 2016-07-26 – 2016-07-27 (×3): via INTRAVENOUS
  Filled 2016-07-26 (×5): qty 1000

## 2016-07-26 NOTE — Progress Notes (Addendum)
Pharmacy Antibiotic Note  Andrew [name] is a 83 y.o. male admitted on 07/23/2016 with sepsis.  Pharmacy has been consulted for Zosyn and vancomycin dosing.   BCID now showing Enterococcus species [name] A/B not detected). After discussion with Dr. Anselm Jungling will discontinue Vanc and Zosyn and begin treatment with Unasyn  1/21 BCx: 2 bottles ENTEROCOCCUS FAECALIS (A)  Plan: Patient's renal function is slowly improving. Will adjust Unasyn dose to  3gm IV every 12  Hours based on current CrCl of >84ml/min.  Will continue to monitor culture and sensitivity results.   Height: 5\' 9"  (175.3 cm) Weight: 160 lb 14.4 oz (73 kg) IBW/kg (Calculated) : 70.7  Temp (24hrs), Avg:98.9 F (37.2 C), Min:98.7 F (37.1 C), Max:99.1 F (37.3 C)   Recent Labs Lab 07/23/16 1731 07/23/16 2023 07/24/16 0446 07/24/16 1038 07/24/16 1953 07/25/16 0353 07/26/16 0410  WBC 23.9*  --  19.8*  --   --  16.6* 17.0*  CREATININE 7.82*  --  6.58*  --   --  5.11* 3.32*  LATICACIDVEN 3.0* 2.3*  --   --  1.3  --   --   VANCORANDOM  --   --   --  13  --   --   --     Estimated Creatinine Clearance: 14.5 mL/min (by C-G formula based on SCr of 3.32 mg/dL (H)).    Allergies  Allergen Reactions  . Keflex [Cephalexin] Other (See Comments)    Patient states this medication made his legs hurt really bad.   Thank you for allowing pharmacy to be a part of this patient's care.  Njeri Vicente M Lilianah Buffin, Pharm.D., BCPS Clinical Pharmacist 07/26/2016 9:02 AM

## 2016-07-26 NOTE — Consult Note (Signed)
Consultation Note Date: 07/26/16  Patient Name: Andrew [name]  DOB: [birth year]  MRN: [id number]  Age / Sex: 83 y.o., male  PCP: Adin Hector, MD Referring Physician: Vaughan Basta, MD  Reason for Consultation: Establishing goals of care  HPI/Patient Profile: 83 y.o. male  with past medical history of CKD, HTN, PVD/PAD with multiple revascularizations, and asthma admitted on 07/23/2016 with acute renal failure from [facility]. He has bilateral nonhealing ulcerations of lower extremities, R>L. This admit, found to have cellulitis, bacteremia, and acute renal failure. He is a known patient of vascular surgery due to multiple previous revascularizations to right lower extremity. Patient has been told he would need an amputation if pain was intractable or infection was not controlled by wound care and antibiotics. 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 Conservative management for now. Followed by nephrology for renal failure and also known patient to the practice-receiving IVF and supportive care. Followed by infectious disease for bacteremia and receiving unasyn. Palliative medicine consultation for goals of care.  Clinical Assessment and Goals of Care: I have reviewed medical records and assessed the patient at bedside. He is lethargic and not following commands. No family at bedside. Per RN case manager, neighbor Zigmund Daniel) has been to bedside and involved in Andrew [name] care.   I spoke with Zigmund Daniel via telephone. She will be back at the hospital in AM to meet with me in person. Introduced Palliative Medicine. I initially asked Zigmund Daniel about the family dynamics. [name] has two children, an adult son and daughter. Son is HCPOA. The children have been estranged for many years and not involved in his life or medical care. Zigmund Daniel is Mr. Truby neighbor and has looked out for him because  "I am all he's got." He has never told her why he doesn't get along with his children.    Discussed severity of current hospitalization and co-morbidities that are contributing, including poor prognosis. Zigmund Daniel seems to understand he may be at the end of life but she does not feel comfortable make a code status decision. After educating on code status, she understands he likely would not survive resuscitation with age and co-morbidities. She states "I don't want him to die alone." She is most worried about helping to pay his bills. I explained to her that I would reach out to son [name]) and encourage him to meet me at the hospital. Zigmund Daniel explained she has been trying to reach out to him and explain severity of his father's health but he will not come to the hospital.   Spoke with [name] via telephone. He tells me he is "very sick with the flu" and will not be coming to the hospital. [name] has not seen Andrew [name] in many years and states "I can't even begin to tell you what has happened" and the patient has told him "to never come back to my house." The patient's wife died many years ago and sounds as if he has been struggling with complicated  grief or depression.    I again discussed the severity of current diagnoses, co-morbidities, and poor prognosis. I attempted to elicit values and goals of care important to the patient. Would he want to be resuscitated or on life support? Would he ever want dialysis or a feeding tube? Also that he may need an amputation and may not be a candidate for surgery. Educated that with age and co-morbidities, he would likely not survive resuscitation and if so, would not have a good quality of life. [name] tells me his father has always been an independent, stubborn man and believes he would not want heroic measures to keep him alive. Agrees with DNR status.   Discussed the difference between aggressive medical interventions and comfort care. [name] would like to continue  antibiotics, fluids, and other supportive care and give him a chance to improve. Briefly discussed hospice as an option if his father does not show signs of improvement. [name] gives me permission to discuss medical information with neighbor, Zigmund Daniel. He states "she has done a great job taking care of him." Questions and concerns were addressed. [name] will be available via phone tomorrow.       SUMMARY OF RECOMMENDATIONS    Discussed and educated on code status with neighbor and son (POA). Changed to DNR. Durable DNR placed in chart.   Continue current interventions.   I will meet with neighbor, Zigmund Daniel in AM to further discuss diagnosis, prognosis, GOC, and hospice.   Code Status/Advance Care Planning:  DNR   Symptom Management:   Per attending  Palliative Prophylaxis:   Aspiration, Delirium Protocol, Oral Care and Turn Reposition  Psycho-social/Spiritual:   Desire for further Chaplaincy support:no  Additional Recommendations: Caregiving  Support/Resources and Education on Hospice  Prognosis:   Unable to determine guarded prognosis with severe vascular disease-nonhealing right ankle ulceration/occlusion, bacteremia, and acute kidney failure with hx of chronic kidney disease. Patient is lethargic with poor oral intake.  Discharge Planning: To Be Determined Likely will need hospice facility     Primary Diagnoses: Present on Admission: . Sepsis ([place])   I have reviewed the medical record, interviewed the patient and family, and examined the patient. The following aspects are pertinent.  Past Medical History:  Diagnosis Date  . Asthma   . Chronic kidney disease   . Hypertension   . Peripheral vascular disease [facility])    Social History   Social History  . Marital status: Unknown    Spouse name: N/A  . Number of children: N/A  . Years of education: N/A   Social History Main Topics  . Smoking status: Former Research scientist (life sciences)  . Smokeless tobacco: Former Systems developer    Quit date:  05/16/1993  . Alcohol use No  . Drug use: No  . Sexual activity: Not Asked   Other Topics Concern  . None   Social History Narrative  . None   Family History  Problem Relation Age of Onset  . Heart failure Mother   . Stroke Father    Scheduled Meds: . albuterol  2.5 mg Nebulization BID  . ampicillin-sulbactam (UNASYN) IV  3 g Intravenous Q12H  . apixaban  2.5 mg Oral BID  . aspirin EC  81 mg Oral Daily  . atorvastatin  20 mg Oral QPM  . Chlorhexidine Gluconate Cloth  6 each Topical Q0600  . cholecalciferol  1,000 Units Oral BH-q7a  . clopidogrel  75 mg Oral Daily  . feeding supplement (ENSURE ENLIVE)  237 mL Oral TID WC  .  fluticasone  2 spray Each Nare Daily  . hydroxyurea  500 mg Oral Daily  . levothyroxine  75 mcg Oral QAC breakfast  . loratadine  10 mg Oral Daily  . mometasone-formoterol  2 puff Inhalation BID  . multivitamin-lutein  1 capsule Oral BID  . mupirocin ointment  1 application Nasal BID  . pantoprazole  40 mg Oral QAC breakfast  . polyvinyl alcohol  1-2 drop Both Eyes Daily  . tiotropium  18 mcg Inhalation Daily  . vitamin B-12  2,500 mcg Oral BH-q7a  . vitamin E  400 Units Oral BH-q7a   Continuous Infusions: . 0.45 % NaCl with KCl 20 mEq / L 75 mL/hr at 07/27/16 0016   PRN Meds:.acetaminophen **OR** acetaminophen, albuterol, bisacodyl, magnesium citrate, naphazoline-glycerin, ondansetron **OR** ondansetron (ZOFRAN) IV, oxyCODONE, senna-docusate, traMADol Medications Prior to Admission:  Prior to Admission medications   Medication Sig Start Date End Date Taking? Authorizing Provider  albuterol (PROVENTIL) (2.5 MG/3ML) 0.083% nebulizer solution Take 2.5 mg by nebulization 2 (two) times daily.   Yes Historical Provider, MD  apixaban (ELIQUIS) 2.5 MG TABS tablet Take 1 tablet (2.5 mg total) by mouth 2 (two) times daily. 06/06/16  Yes Algernon Huxley, MD  aspirin EC 81 MG tablet Take 1 tablet (81 mg total) by mouth daily. 02/07/16  Yes Algernon Huxley, MD    atorvastatin (LIPITOR) 20 MG tablet Take 1 tablet (20 mg total) by mouth daily. Patient taking differently: Take 20 mg by mouth every evening.  02/07/16  Yes Algernon Huxley, MD  Cholecalciferol 1000 units capsule Take 1,000 Units by mouth every morning.   Yes Historical Provider, MD  clopidogrel (PLAVIX) 75 MG tablet Take 75 mg by mouth daily.  04/06/16  Yes Historical Provider, MD  Cyanocobalamin (VITAMIN B-12) 2500 MCG SUBL Place 2,500 mcg under the tongue every morning.   Yes Historical Provider, MD  fexofenadine (ALLEGRA) 180 MG tablet Take 180 mg by mouth daily.   Yes Historical Provider, MD  fluticasone (VERAMYST) 27.5 MCG/SPRAY nasal spray Place 2 sprays into the nose daily.   Yes Historical Provider, MD  Fluticasone-Salmeterol (ADVAIR) 250-50 MCG/DOSE AEPB Inhale 1 puff into the lungs 2 (two) times daily.   Yes Historical Provider, MD  hydrochlorothiazide (MICROZIDE) 12.5 MG capsule Take 12.5 mg by mouth every morning.    Yes Historical Provider, MD  levothyroxine (SYNTHROID, LEVOTHROID) 75 MCG tablet Take 75 mcg by mouth daily before breakfast.   Yes Historical Provider, MD  losartan (COZAAR) 25 MG tablet Take 25 mg by mouth every morning. 03/09/16  Yes Historical Provider, MD  Multiple Vitamins-Minerals (PRESERVISION/LUTEIN) CAPS Take 1 capsule by mouth 2 (two) times daily.   Yes Historical Provider, MD  omeprazole (PRILOSEC) 20 MG capsule Take 20 mg by mouth daily before breakfast.    Yes Historical Provider, MD  polyvinyl alcohol-povidone (HYPOTEARS) 1.4-0.6 % ophthalmic solution Place 1-2 drops into both eyes daily.   Yes Historical Provider, MD  Tetrahydroz-Glyc-Hyprom-PEG (VISINE MAXIMUM REDNESS RELIEF) 0.05-0.2-0.36-1 % SOLN Place 1 drop into both eyes daily.   Yes Historical Provider, MD  tiotropium (SPIRIVA HANDIHALER) 18 MCG inhalation capsule Place 18 mcg into inhaler and inhale daily.   Yes Historical Provider, MD  traMADol (ULTRAM) 50 MG tablet Take 1-2 tablets (50-100 mg total) by  mouth 3 (three) times daily as needed (for pain.). 06/06/16  Yes Algernon Huxley, MD  vitamin E 400 UNIT capsule Take 400 Units by mouth every morning.    Yes Historical Provider, MD  Allergies  Allergen Reactions  . Keflex [Cephalexin] Other (See Comments)    Patient states this medication made his legs hurt really bad.   Review of Systems  Unable to perform ROS: Acuity of condition   Physical Exam  Constitutional: He appears lethargic. He appears ill.  HENT:  Head: Normocephalic and atraumatic.  Cardiovascular: Regular rhythm and normal heart sounds.   Pulmonary/Chest: No accessory muscle usage. No tachypnea. No respiratory distress. He has decreased breath sounds.  Abdominal: Normal appearance.  Neurological: He appears lethargic.  Skin: Skin is warm and dry. There is pallor.  Psychiatric: His speech is delayed. Cognition and memory are impaired. He is inattentive.  Nursing note and vitals reviewed.  Vital Signs: BP 131/65 (BP Location: Left Arm)   Pulse (!) 111   Temp 98.9 F (37.2 C) (Oral)   Resp 20   Ht 5\' 9"  (1.753 m)   Wt 73 kg (160 lb 14.4 oz)   SpO2 100%   BMI 23.76 kg/m  Pain Assessment: PAINAD   Pain Score: Asleep  SpO2: SpO2: 100 % O2 Device:SpO2: 100 % O2 Flow Rate: .O2 Flow Rate (L/min): 3.5 L/min  IO: Intake/output summary:   Intake/Output Summary (Last 24 hours) at 07/27/16 0818 Last data filed at 07/27/16 0308  Gross per 24 hour  Intake          1453.75 ml  Output                0 ml  Net          1453.75 ml    LBM: Last BM Date: 07/23/16 Baseline Weight: Weight: 73.3 kg (161 lb 11.2 oz) Most recent weight: Weight: 73 kg (160 lb 14.4 oz)     Palliative Assessment/Data: PPS 20%   Flowsheet Rows   Flowsheet Row Most Recent Value  Intake Tab  Referral Department  Hospitalist  Unit at Time of Referral  Oncology Unit  Palliative Care Primary Diagnosis  Sepsis/Infectious Disease  Date Notified  07/25/16  Palliative Care Type  New Palliative  care  Reason for referral  Clarify Goals of Care  Date of Admission  07/23/16  Date first seen by Palliative Care  07/26/16  # of days Palliative referral response time  1 Day(s)  # of days IP prior to Palliative referral  2  Clinical Assessment  Palliative Performance Scale Score  20%  Psychosocial & Spiritual Assessment  Palliative Care Outcomes  Patient/Family meeting held?  Yes  Who was at the meeting?  neighbor. Spoke with son via telephone  Palliative Care Outcomes  Clarified goals of care, Completed durable DNR, Changed CPR status, ACP counseling assistance, Provided psychosocial or spiritual support, Provided end of life care assistance      Time In: 1500 Time Out: 1615 Time Total: 62min Greater than 50%  of this time was spent counseling and coordinating care related to the above assessment and plan.  Signed by:  Ihor Dow, FNP-C Palliative Medicine Team  Phone: [phone] Fax: [phone]   Please contact Palliative Medicine Team phone at [phone] for questions and concerns.  For individual provider: See Shea Evans

## 2016-07-26 NOTE — Care Management (Signed)
Neighbor Kay at the bedside [phone]). States that he has been taking care of Mr. Nabozny for years. Visited several times a day when he went to [facility]. States that Mr. Polmanteer has a son and daughter.                                                                   Palliative Care consult. Poor prognosis.                                                                                   Shelbie Ammons RN MSN CCM Care Management

## 2016-07-26 NOTE — Progress Notes (Signed)
[facility] NOTE  Patient Care Team: Adin Hector, MD as PCP - General (Internal Medicine) Christin Fudge, MD as Consulting Physician (Surgery) Wellington Hampshire, MD as Consulting Physician (Cardiology)  CHIEF COMPLAINTS/PURPOSE OF CONSULTATION: Thombocytosis  HISTORY OF PRESENTING ILLNESS:  Please note patient  Is a poor historian;  No  Family available by Durene Cal 83 y.o.  male  Multiple medical problems-  Is currently admitted from  Nursing home  Bilateral  Upper and  Lower extremity ulcerations. He hs been evaluated by vascular surgery.  And is currently on  Eliquis, Asprin; and plavix.  Patient is als thought to be septic/ bacteremic from cellulitis. Patient also in acute renal failure with a creatinine of 6 at presentation currently 3.   Hematology has been consulted with regards to his elevated platelet count. Review of the platelet count shows patient had chronic elevation of Platelets in the range of 600-700 for the last few years. Platelets currently are above 1 million.  ROS: Review of systems can't be assessed.   MEDICAL HISTORY:  Past Medical History:  Diagnosis Date  . Asthma   . Chronic kidney disease   . Hypertension   . Peripheral vascular disease ([place])     SURGICAL HISTORY: Past Surgical History:  Procedure Laterality Date  . PERIPHERAL VASCULAR CATHETERIZATION Right 02/07/2016   Procedure: Lower Extremity Angiography;  Surgeon: Algernon Huxley, MD;  Location: [facility];  Service: Cardiovascular;  Laterality: Right;  . PERIPHERAL VASCULAR CATHETERIZATION Right 03/27/2016   Procedure: Lower Extremity Angiography;  Surgeon: Algernon Huxley, MD;  Location: [facility];  Service: Cardiovascular;  Laterality: Right;  . PERIPHERAL VASCULAR CATHETERIZATION  03/27/2016   Procedure: Lower Extremity Intervention;  Surgeon: Algernon Huxley, MD;  Location: [facility];  Service: Cardiovascular;;  . PERIPHERAL VASCULAR  CATHETERIZATION Right 03/28/2016   Procedure: Lower Extremity Angiography;  Surgeon: Algernon Huxley, MD;  Location: [facility];  Service: Cardiovascular;  Laterality: Right;  . PERIPHERAL VASCULAR CATHETERIZATION Right 06/01/2016   Procedure: Lower Extremity Angiography;  Surgeon: Algernon Huxley, MD;  Location: [facility];  Service: Cardiovascular;  Laterality: Right;  . PERIPHERAL VASCULAR CATHETERIZATION  06/01/2016   Procedure: Lower Extremity Intervention;  Surgeon: Algernon Huxley, MD;  Location: [facility];  Service: Cardiovascular;;  . SHOULDER SURGERY  1974  . UPPER GI ENDOSCOPY      SOCIAL HISTORY: Social History   Social History  . Marital status: Unknown    Spouse name: N/A  . Number of children: N/A  . Years of education: N/A   Occupational History  . Not on file.   Social History Main Topics  . Smoking status: Former Research scientist (life sciences)  . Smokeless tobacco: Former Systems developer    Quit date: 05/16/1993  . Alcohol use No  . Drug use: No  . Sexual activity: Not on file   Other Topics Concern  . Not on file   Social History Narrative  . No narrative on file    FAMILY HISTORY: Family History  Problem Relation Age of Onset  . Heart failure Mother   . Stroke Father     ALLERGIES:  is allergic to keflex [cephalexin].  MEDICATIONS:  Current Facility-Administered Medications  Medication Dose Route Frequency Provider Last Rate Last Dose  . 0.45 % NaCl with KCl 20 mEq / L infusion   Intravenous Continuous Murlean Iba, MD 75 mL/hr at 07/26/16 1025    .  acetaminophen (TYLENOL) tablet 650 mg  650 mg Oral Q6H PRN Alexis Hugelmeyer, DO       Or  . acetaminophen (TYLENOL) suppository 650 mg  650 mg Rectal Q6H PRN Alexis Hugelmeyer, DO      . albuterol (PROVENTIL) (2.5 MG/3ML) 0.083% nebulizer solution 2.5 mg  2.5 mg Nebulization Q6H PRN Alexis Hugelmeyer, DO      . albuterol (PROVENTIL) (2.5 MG/3ML) 0.083% nebulizer solution 2.5 mg  2.5 mg Nebulization BID Alexis  Hugelmeyer, DO   2.5 mg at 07/26/16 0806  . Ampicillin-Sulbactam (UNASYN) 3 g in sodium chloride 0.9 % 100 mL IVPB  3 g Intravenous Q12H Sheema M Hallaji, RPH   3 g at 07/26/16 1026  . apixaban (ELIQUIS) tablet 2.5 mg  2.5 mg Oral BID Alexis Hugelmeyer, DO   2.5 mg at 07/26/16 1028  . aspirin EC tablet 81 mg  81 mg Oral Daily Alexis Hugelmeyer, DO   81 mg at 07/26/16 1029  . atorvastatin (LIPITOR) tablet 20 mg  20 mg Oral QPM Alexis Hugelmeyer, DO   20 mg at 07/26/16 1724  . bisacodyl (DULCOLAX) EC tablet 5 mg  5 mg Oral Daily PRN [employer], DO      . Chlorhexidine Gluconate Cloth 2 % PADS 6 each  6 each Topical Q0600 Vaughan Basta, MD   6 each at 07/26/16 0556  . cholecalciferol (VITAMIN D) tablet 1,000 Units  1,000 Units Oral BH-q7a Alexis Hugelmeyer, DO   1,000 Units at 07/26/16 0554  . clopidogrel (PLAVIX) tablet 75 mg  75 mg Oral Daily Alexis Hugelmeyer, DO   75 mg at 07/26/16 1028  . feeding supplement (ENSURE ENLIVE) (ENSURE ENLIVE) liquid 237 mL  237 mL Oral TID WC Vaughan Basta, MD   237 mL at 07/26/16 1724  . fluticasone (FLONASE) 50 MCG/ACT nasal spray 2 spray  2 spray Each Nare Daily Alexis Hugelmeyer, DO   2 spray at 07/26/16 1036  . levothyroxine (SYNTHROID, LEVOTHROID) tablet 75 mcg  75 mcg Oral QAC breakfast Alexis Hugelmeyer, DO   75 mcg at 07/26/16 1029  . loratadine (CLARITIN) tablet 10 mg  10 mg Oral Daily Alexis Hugelmeyer, DO   10 mg at 07/26/16 1028  . magnesium citrate solution 1 Bottle  1 Bottle Oral Once PRN Alexis Hugelmeyer, DO      . mometasone-formoterol (DULERA) 200-5 MCG/ACT inhaler 2 puff  2 puff Inhalation BID Alexis Hugelmeyer, DO   2 puff at 07/25/16 1050  . multivitamin-lutein (OCUVITE-LUTEIN) capsule 1 capsule  1 capsule Oral BID Alexis Hugelmeyer, DO   1 capsule at 07/26/16 1028  . mupirocin ointment (BACTROBAN) 2 % 1 application  1 application Nasal BID Vaughan Basta, MD   1 application at [id number]  . naphazoline-glycerin  (CLEAR EYES) ophth solution 1-2 drop  1-2 drop Both Eyes QID PRN Alexis Hugelmeyer, DO      . ondansetron (ZOFRAN) tablet 4 mg  4 mg Oral Q6H PRN Alexis Hugelmeyer, DO       Or  . ondansetron (ZOFRAN) injection 4 mg  4 mg Intravenous Q6H PRN Alexis Hugelmeyer, DO      . oxyCODONE (Oxy IR/ROXICODONE) immediate release tablet 5 mg  5 mg Oral Q4H PRN Vaughan Basta, MD   5 mg at 07/26/16 0257  . pantoprazole (PROTONIX) EC tablet 40 mg  40 mg Oral QAC breakfast Alexis Hugelmeyer, DO   40 mg at 07/26/16 1029  . polyvinyl alcohol (LIQUIFILM TEARS) 1.4 % ophthalmic solution 1-2 drop  1-2 drop  Both Eyes Daily Alexis Hugelmeyer, DO   1 drop at 07/26/16 1037  . senna-docusate (Senokot-S) tablet 1 tablet  1 tablet Oral QHS PRN Alexis Hugelmeyer, DO      . tiotropium [employer] inhalation capsule 18 mcg  18 mcg Inhalation Daily Alexis Hugelmeyer, DO   18 mcg at 07/25/16 1051  . traMADol (ULTRAM) tablet 50 mg  50 mg Oral TID PRN Vaughan Basta, MD      . vitamin B-12 (CYANOCOBALAMIN) tablet 2,500 mcg  2,500 mcg Oral BH-q7a Alexis Hugelmeyer, DO   2,500 mcg at 07/26/16 0553  . vitamin E capsule 400 Units  400 Units Oral BH-q7a Alexis Hugelmeyer, DO   400 Units at 07/26/16 0557      .  PHYSICAL EXAMINATION:  Vitals:   07/26/16 0415 07/26/16 1231  BP: (!) 126/52 (!) 119/56  Pulse: (!) 106 79  Resp: 20 (!) 22  Temp: 99.1 F (37.3 C) 98 F (36.7 C)   Filed Weights   07/23/16 1725 07/24/16 0356  Weight: 161 lb 11.2 oz (73.3 kg) 160 lb 14.4 oz (73 kg)    GENERAL: Frail-appearing cachectic Caucasian male patient. No family by the bedside. No acute distress. EYES: no pallor or icterus OROPHARYNX:  Dry.  NECK: supple, no masses felt LYMPH:  no palpable lymphadenopathy in the cervical, axillary or inguinal regions LUNGS: decreased breath sounds to auscultation at bases and  No wheeze or crackles HEART/CVS: regular rate & rhythm and no murmurs; No lower extremity edema. Bilateral lower  extremities breast discoloration; chronic ulcerations noted. ABDOMEN: abdomen soft, non-tender and normal bowel sounds PSYCH: alert & oriented x 0-1.  NEURO: no focal motor/sensory deficits LABORATORY DATA:  I have reviewed the data as listed Lab Results  Component Value Date   WBC 17.0 (H) 07/26/2016   HGB 10.4 (L) 07/26/2016   HCT 30.4 (L) 07/26/2016   MCV 92.7 07/26/2016   PLT 1,120 (HH) 07/26/2016    Recent Labs  07/23/16 1731 07/24/16 0446 07/25/16 0353 07/26/16 0410  NA 132* 135 143 146*  K 3.7 3.3* 3.0* 3.2*  CL 86* 93* 103 112*  CO2 24 24 23 24   GLUCOSE 144* 96 88 107*  BUN 146* 133* 116* 97*  CREATININE 7.82* 6.58* 5.11* 3.32*  CALCIUM 8.3* 7.7* 8.1* 8.3*  GFRNONAA 5* 7* 9* 15*  GFRAA 6* 8* 10* 17*  PROT 7.2  --   --   --   ALBUMIN 3.3*  --   --   --   AST 71*  --   --   --   ALT 29  --   --   --   ALKPHOS 82  --   --   --   BILITOT 0.4  --   --   --     RADIOGRAPHIC STUDIES: I have personally reviewed the radiological images as listed and agreed with the findings in the report. US Renal  Result Date: 07/23/2016 CLINICAL DATA:  83 y/o  M; acute renal failure. EXAM: RENAL / URINARY TRACT ULTRASOUND COMPLETE COMPARISON:  04/21/2004 CT of abdomen and pelvis. FINDINGS: Right Kidney: Length: 9.9 cm. Mildly increased echogenicity when compared with the liver parenchyma. Upper pole simple appearing cyst measuring up to 1.4 cm. Left Kidney: Length: 9.9 cm. Simple appearing cysts within the mid kidney measuring 2.9 in 2.3 cm. Mildly increased echogenicity of the renal parenchyma when compared to the spleen. Bladder: Appears normal for degree of bladder distention. IMPRESSION: Mild increase in kidney echogenicity bilaterally is  compatible with medical renal disease. No hydronephrosis. Electronically Signed   By: Kristine Garbe M.D.   On: 07/23/2016 22:18   [place] Lower Ext Art Bilat  Result Date: 07/25/2016 CLINICAL DATA:  Peripheral arterial disease and right  lower extremity ulceration. EXAM: BILATERAL LOWER EXTREMITY ARTERIAL DUPLEX SCAN TECHNIQUE: Gray-scale sonography as well as color Doppler and duplex ultrasound was performed to evaluate the arteries of both lower extremities. COMPARISON:  None. FINDINGS: Right lower extremity: ABI not obtained. The right common femoral artery is patent with monophasic waveform. The proximal right profunda femoral artery has an elevated peak systolic velocity measuring up to 255 cm/sec and suggestive for a stenosis. The right superficial femoral artery is occluded at the origin. Evidence for an occluded stent in the mid right SFA. Distal right SFA is occluded. Right popliteal artery is occluded. There appears to be minimal flow in the right anterior tibial artery. There is also some flow in the right posterior tibial artery. Left lower extremity: ABI not obtained. Left common femoral artery is patent with a monophasic waveform. Left profunda femoral artery is patent with monophasic waveform. Left SFA has some atherosclerotic disease but it is patent with a monophasic waveform. Echogenic plaque in the left popliteal artery. Left popliteal artery is patent with a monophasic waveform. Monophasic waveform in the left posterior tibial artery. Monophasic waveform in the anterior tibial artery. IMPRESSION: Occlusion of the right SFA and right popliteal artery. Left lower extremity arteries are patent. Electronically Signed   By: Markus Daft M.D.   On: 07/25/2016 20:25   Dg Chest Portable 1 View  Result Date: 07/23/2016 CLINICAL DATA:  Weakness.  Acute renal injury. EXAM: PORTABLE CHEST 1 VIEW COMPARISON:  03/27/2016 FINDINGS: Heart size remains stable. Mild bilateral scarring again noted. No evidence of pulmonary infiltrate or edema. No evidence of pneumothorax or pleural effusion. IMPRESSION: No active disease. Electronically Signed   By: Earle Gell M.D.   On: 07/23/2016 18:36    ASSESSMENT & PLAN:   # 83 year old male patient  with multiple medical problems and also peripheral vascular disease currently admitted to the hospital for bacteremia sepsis and acute renal failure; and also thrombocytosis.  # Extreme thrombocytosis- platelets of 1 million. 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 This could be well be worsening patient thrombotic complications- along with a coexistent peripheral vascular disease. Patient could be started on Hydrea however this has to be evaluated in the context of his overall poor prognosis. Check jak-2; MPL; CALR mutation. Start pt on hydrea 500 mg once a day for now.   # Peripheral vascular disease- currently on aspirin and Plavix and Eliquis.   # Multiple co- morbidities- acute renal failure; sepsis.  # DNR/DNI- agree with overall poor prognosis.   #  Thank you Dr. Molli Hazard for allowing me to participate in the care of your pleasant patient. Please do not hesitate to contact me with questions or concerns in the interim.    Cammie Sickle, MD 07/26/2016 7:53 PM

## 2016-07-26 NOTE — Progress Notes (Signed)
Subjective:  Patient Known to our practice from outpatient. He is followed by Dr. Holley Raring for CK D   he is admitted for altered mental status which is thought to be secondary to sepsis from cellulitis of lower extremity wounds He is also found to have severe acute kidney injury His baseline creatinine appears to be 1.10 from December 3 Admission creatinine is 7.82  Today's creatinine has improved to 3.32, BUN is 97  Patient is lethargic, awakens to verbal stimuli but immediately goes back to sleep.  Unable to answer questions.  Blood cultures are positive for enterococcus  Objective:  Vital signs in last 24 hours:  Temp:  [98.7 F (37.1 C)-99.1 F (37.3 C)] 99.1 F (37.3 C) (01/24 0415) Pulse Rate:  [88-120] 106 (01/24 0415) Resp:  [18-22] 20 (01/24 0415) BP: (108-126)/(52-79) 126/52 (01/24 0415) SpO2:  [96 %-100 %] 97 % (01/24 0415)  Weight change:  Filed Weights   07/23/16 1725 07/24/16 0356  Weight: 73.3 kg (161 lb 11.2 oz) 73 kg (160 lb 14.4 oz)    Intake/Output:    Intake/Output Summary (Last 24 hours) at 07/26/16 [id number] Last data filed at 07/26/16 0204  Gross per 24 hour  Intake          2236.67 ml  Output                0 ml  Net          2236.67 ml     Physical Exam: General: Critically ill-appearing   HEENT Dry oral mucous membranes   Neck Supple  Pulm/lungs Shallow breathing effort, clear anteriorly and laterally   CVS/Heart Irregular   Abdomen:  Soft, nontender   Extremities: Redness, edema over bilateral lower extremities, foul smelling wounds   Neurologic: Mumbling, not able to answer questions today.  Skin:  decreased turgor           Basic Metabolic Panel:   Recent Labs Lab 07/23/16 1731 07/24/16 0446 07/25/16 0353 07/25/16 1724 07/26/16 0410  NA 132* 135 143  --  146*  K 3.7 3.3* 3.0*  --  3.2*  CL 86* 93* 103  --  112*  CO2 24 24 23   --  24  GLUCOSE 144* 96 88  --  107*  BUN 146* 133* 116*  --  97*  CREATININE 7.82* 6.58* 5.11*   --  3.32*  CALCIUM 8.3* 7.7* 8.1*  --  8.3*  MG  --   --   --  2.3  --      CBC:  Recent Labs Lab 07/23/16 1731 07/24/16 0446 07/25/16 0353 07/26/16 0410  WBC 23.9* 19.8* 16.6* 17.0*  NEUTROABS 21.6*  --   --   --   HGB 11.9* 10.2* 10.0* 10.4*  HCT 34.7* 30.1* 28.7* 30.4*  MCV 90.0 91.0 91.5 92.7  PLT 1,457* 1,256* 1,107* 1,120*      Microbiology:  Recent Results (from the past 720 hour(s))  Urine culture     Status: Abnormal   Collection Time: 07/11/16  9:00 AM  Result Value Ref Range Status   Specimen Description URINE, RANDOM  Final   Special Requests NONE  Final   Culture MULTIPLE SPECIES PRESENT, SUGGEST RECOLLECTION (A)  Final   Report Status 07/13/2016 FINAL  Final  Blood culture (routine x 2)     Status: Abnormal (Preliminary result)   Collection Time: 07/23/16  6:49 PM  Result Value Ref Range Status   Specimen Description BLOOD LEFT HAND  Final  Special Requests   Final    BOTTLES DRAWN AEROBIC AND ANAEROBIC ANA13ML AER12ML   Culture  Setup Time   Final    GRAM POSITIVE COCCI ANAEROBIC BOTTLE ONLY CRITICAL RESULT CALLED TO, READ BACK BY AND VERIFIED WITH: CHRISTINE KATSOUDAS ON 07/24/16 AT 1245 QSD Performed at [facility], [place] [address], [place], [place] [address]    Culture ENTEROCOCCUS FAECALIS (A)  Final   Report Status PENDING  Incomplete  Blood culture (routine x 2)     Status: Abnormal (Preliminary result)   Collection Time: 07/23/16  6:49 PM  Result Value Ref Range Status   Specimen Description BLOOD LEFT ASSIST CONTROL  Final   Special Requests   Final    BOTTLES DRAWN AEROBIC AND ANAEROBIC ANA13ML AER13ML   Culture  Setup Time   Final    GRAM POSITIVE COCCI IN BOTH AEROBIC AND ANAEROBIC BOTTLES CRITICAL VALUE NOTED.  VALUE IS CONSISTENT WITH PREVIOUSLY REPORTED AND CALLED VALUE. Performed at [facility], [place] [address] [place], [place] [address]    Culture ENTEROCOCCUS FAECALIS (A)  Final   Report Status PENDING   Incomplete  Blood Culture ID Panel (Reflexed)     Status: Abnormal   Collection Time: 07/23/16  6:49 PM  Result Value Ref Range Status   Enterococcus species DETECTED (A) NOT DETECTED Final    Comment: CRITICAL RESULT CALLED TO, READ BACK BY AND VERIFIED WITH: CHRISTINE KATSOUDAS ON 07/24/16 AT 1245 QSD    Vancomycin resistance NOT DETECTED NOT DETECTED Final   Listeria monocytogenes NOT DETECTED NOT DETECTED Final   Staphylococcus species NOT DETECTED NOT DETECTED Final   Staphylococcus aureus NOT DETECTED NOT DETECTED Final   Streptococcus species NOT DETECTED NOT DETECTED Final   Streptococcus agalactiae NOT DETECTED NOT DETECTED Final   Streptococcus pneumoniae NOT DETECTED NOT DETECTED Final   Streptococcus pyogenes NOT DETECTED NOT DETECTED Final   Acinetobacter baumannii NOT DETECTED NOT DETECTED Final   Enterobacteriaceae species NOT DETECTED NOT DETECTED Final   Enterobacter cloacae complex NOT DETECTED NOT DETECTED Final   Escherichia coli NOT DETECTED NOT DETECTED Final   Klebsiella oxytoca NOT DETECTED NOT DETECTED Final   Klebsiella pneumoniae NOT DETECTED NOT DETECTED Final   Proteus species NOT DETECTED NOT DETECTED Final   Serratia marcescens NOT DETECTED NOT DETECTED Final   Haemophilus influenzae NOT DETECTED NOT DETECTED Final   Neisseria meningitidis NOT DETECTED NOT DETECTED Final   Pseudomonas aeruginosa NOT DETECTED NOT DETECTED Final   Candida albicans NOT DETECTED NOT DETECTED Final   Candida glabrata NOT DETECTED NOT DETECTED Final   Candida krusei NOT DETECTED NOT DETECTED Final   Candida parapsilosis NOT DETECTED NOT DETECTED Final   Candida tropicalis NOT DETECTED NOT DETECTED Final  MRSA PCR Screening     Status: Abnormal   Collection Time: 07/24/16  4:49 AM  Result Value Ref Range Status   MRSA by PCR POSITIVE (A) NEGATIVE Final    Comment:        The GeneXpert MRSA Assay (FDA approved for NASAL specimens only), is one component of a comprehensive  MRSA colonization surveillance program. It is not intended to diagnose MRSA infection nor to guide or monitor treatment for MRSA infections. RESULT CALLED TO, READ BACK BY AND VERIFIED WITH: PHYLLIS KING ON 07/24/16 AT 0649 BY [facility]   Culture, blood (single) w Reflex to ID Panel     Status: None (Preliminary result)   Collection Time: 07/24/16  2:49 PM  Result Value  Ref Range Status   Specimen Description BLOOD LEFT AC  Final   Special Requests   Final    BOTTLES DRAWN AEROBIC AND ANAEROBIC AER 10ML ANA 14ML   Culture NO GROWTH 2 DAYS  Final   Report Status PENDING  Incomplete    Coagulation Studies: No results for input(s): LABPROT, INR in the last 72 hours.  Urinalysis:  Recent Labs  07/25/16 1100  COLORURINE YELLOW*  LABSPEC 1.013  PHURINE 5.0  GLUCOSEU NEGATIVE  HGBUR MODERATE*  BILIRUBINUR NEGATIVE  KETONESUR NEGATIVE  PROTEINUR NEGATIVE  NITRITE NEGATIVE  LEUKOCYTESUR NEGATIVE      Imaging: [place] Lower Ext Art Bilat  Result Date: 07/25/2016 CLINICAL DATA:  Peripheral arterial disease and right lower extremity ulceration. EXAM: BILATERAL LOWER EXTREMITY ARTERIAL DUPLEX SCAN TECHNIQUE: Gray-scale sonography as well as color Doppler and duplex ultrasound was performed to evaluate the arteries of both lower extremities. COMPARISON:  None. FINDINGS: Right lower extremity: ABI not obtained. The right common femoral artery is patent with monophasic waveform. The proximal right profunda femoral artery has an elevated peak systolic velocity measuring up to 255 cm/sec and suggestive for a stenosis. The right superficial femoral artery is occluded at the origin. Evidence for an occluded stent in the mid right SFA. Distal right SFA is occluded. Right popliteal artery is occluded. There appears to be minimal flow in the right anterior tibial artery. There is also some flow in the right posterior tibial artery. Left lower extremity: ABI not obtained. Left common femoral artery is patent  with a monophasic waveform. Left profunda femoral artery is patent with monophasic waveform. Left SFA has some atherosclerotic disease but it is patent with a monophasic waveform. Echogenic plaque in the left popliteal artery. Left popliteal artery is patent with a monophasic waveform. Monophasic waveform in the left posterior tibial artery. Monophasic waveform in the anterior tibial artery. IMPRESSION: Occlusion of the right SFA and right popliteal artery. Left lower extremity arteries are patent. Electronically Signed   By: Markus Daft M.D.   On: 07/25/2016 20:25     Medications:   . 0.45 % NaCl with KCl 20 mEq / L     . albuterol  2.5 mg Nebulization BID  . ampicillin-sulbactam (UNASYN) IV  3 g Intravenous Q12H  . apixaban  2.5 mg Oral BID  . aspirin EC  81 mg Oral Daily  . atorvastatin  20 mg Oral QPM  . Chlorhexidine Gluconate Cloth  6 each Topical Q0600  . cholecalciferol  1,000 Units Oral BH-q7a  . clopidogrel  75 mg Oral Daily  . feeding supplement (ENSURE ENLIVE)  237 mL Oral TID WC  . fluticasone  2 spray Each Nare Daily  . levothyroxine  75 mcg Oral QAC breakfast  . loratadine  10 mg Oral Daily  . mometasone-formoterol  2 puff Inhalation BID  . multivitamin-lutein  1 capsule Oral BID  . mupirocin ointment  1 application Nasal BID  . pantoprazole  40 mg Oral QAC breakfast  . polyvinyl alcohol  1-2 drop Both Eyes Daily  . tiotropium  18 mcg Inhalation Daily  . vitamin B-12  2,500 mcg Oral BH-q7a  . vitamin E  400 Units Oral BH-q7a   acetaminophen **OR** acetaminophen, albuterol, bisacodyl, magnesium citrate, naphazoline-glycerin, ondansetron **OR** ondansetron (ZOFRAN) IV, oxyCODONE, senna-docusate, traMADol  Assessment/ Plan:  83 y.o. male With asthma, chronic kidney disease, hypertension, peripheral vascular disease, nursing home resident at [facility]  1. Acute kidney injury 2. Chronic kidney disease stage III 3.  Sepsis from lower extremity wounds, enterococcus  bacteremia 4. Hypokalemia 5. hypernatremia  Acute kidney injury is likely secondary to severe dehydration leading to possibly ATN. Sepsis, bacteremia and cellulitis are also playing a role. Changed fluid to 1/2 NS Serum creatinine and BUN continue to improve. Distal right SFA noted on [place]. Vascular consult pending. Supportive care. Will follow   LOS: 2 Lexiana Spindel 1/24/20189:23 AM

## 2016-07-26 NOTE — Progress Notes (Signed)
[facility]  Daily Progress Note   Subjective  - * No surgery found *  Patient very confused, unable to provide any history.  He is much less responsive today and does not recognize me at this point.    Objective Vitals:   07/25/16 1957 07/25/16 2048 07/26/16 0415 07/26/16 1231  BP:  109/79 (!) 126/52 (!) 119/56  Pulse:  (!) 120 (!) 106 79  Resp:  (!) 22 20 (!) 22  Temp:   99.1 F (37.3 C) 98 F (36.7 C)  TempSrc:   Axillary   SpO2: 97% 100% 97% 100%  Weight:      Height:        Intake/Output Summary (Last 24 hours) at 07/26/16 1524 Last data filed at 07/26/16 0204  Gross per 24 hour  Intake          2236.67 ml  Output                0 ml  Net          2236.67 ml    PULM  CTAB CV  RRR VASC  Right foot wounds stable. Mild surrounding erythema. No pulses present.  Laboratory CBC    Component Value Date/Time   WBC 17.0 (H) 07/26/2016 0410   HGB 10.4 (L) 07/26/2016 0410   HCT 30.4 (L) 07/26/2016 0410   PLT 1,120 (HH) 07/26/2016 0410    BMET    Component Value Date/Time   NA 146 (H) 07/26/2016 0410   K 3.2 (L) 07/26/2016 0410   CL 112 (H) 07/26/2016 0410   CO2 24 07/26/2016 0410   GLUCOSE 107 (H) 07/26/2016 0410   BUN 97 (H) 07/26/2016 0410   CREATININE 3.32 (H) 07/26/2016 0410   CALCIUM 8.3 (L) 07/26/2016 0410   GFRNONAA 15 (L) 07/26/2016 0410   GFRAA 17 (L) 07/26/2016 0410    Assessment/Planning:    Ulceration and infection in right foot wound.  He has non-reconstructable vascular disease and the only option at this point would be an amputation.  This would be a highly morbid procedure particularly with his age and other associated issues.  Renal insufficiency.  Severe but is improving.  Nephrology following  Enterococcus bacteremia.  On IV Abx.  With all of his ongoing issues and advanced age, palliative care and hospice alone at this point would be a very reasonable option    Leotis Pain  07/26/2016, 3:24 PM

## 2016-07-26 NOTE — Progress Notes (Signed)
Physical Therapy Treatment Patient Details Name: SUTTON LESSMAN MRN: [id number] DOB: [birth year] Today's Date: 07/26/2016    History of Present Illness Pt is a 83 y.o. male presenting to hospital from facility with elevated BUN.  Pt admitted to hospital with sepsis (probably secondary to cellulitis and LE wounds) and AKI.  Per chart review pt with chronic nonhealing arterial ulcers R posterior lower leg and R lateral lower leg.  Pt with multiple previous revascularization procedures R LE (essentially no run-off distally with non-reconstructable vascular disease).  PMH includes CKD, htn, PVD, asthma.    PT Comments    Pt agreeable to PT and does demonstrate some effort with lower extremity exercises in bed. Pt currently wearing protective bilateral hand mitts. Pt lethargic, restless and with general poor awareness. Pt inconsistently follows instruction with assist for exercises and tolerates better on the left than right. Cannot verbalize pain, but grimaces/moans with touch and attempted movement to the Right lower extremity. Poor range, significant weakness throughout; heart rage 97-109 throughout session. Continue PT treatment to progress range, strength and endurance to improve/allow for functional mobility.   Follow Up Recommendations  SNF     Equipment Recommendations       Recommendations for Other Services       Precautions / Restrictions Precautions Precautions: Fall Restrictions Weight Bearing Restrictions: No    Mobility  Bed Mobility               General bed mobility comments: Not tested; unsafe due to congnition  Transfers                    Ambulation/Gait                 Stairs            Wheelchair Mobility    Modified Rankin (Stroke Patients Only)       Balance                                    Cognition Arousal/Alertness: Lethargic Behavior During Therapy: Restless (wearing protective mitts) Overall  Cognitive Status: No family/caregiver present to determine baseline cognitive functioning                 General Comments: Unable to effectively communicate; restless, inconsistently follows instruction for some exercises with assist.    Exercises General Exercises - Lower Extremity Ankle Circles/Pumps: Other (comment) (attempted; unable; notes pain with touch to A) Quad Sets: Strengthening;Both;10 reps;Supine (unable to reach full ext; R weaker than L) Gluteal Sets: Strengthening;Both;10 reps;Supine Short Arc Quad: AAROM;Left;10 reps;Supine (R x 5) Heel Slides: AAROM;Left;Supine;10 reps;Other (comment) (attempted R; unable with A) Hip ABduction/ADduction: AAROM;Left;10 reps;Supine (Attempted R; unable with A)    General Comments        Pertinent Vitals/Pain Pain Assessment:  (Unable to communicate effectively; groans with pain to LLE) Faces Pain Scale: Hurts whole lot Pain Location: LLE    Home Living                      Prior Function            PT Goals (current goals can now be found in the care plan section)      Frequency    Min 2X/week      PT Plan Current plan remains appropriate    Co-evaluation  End of Session Equipment Utilized During Treatment: Oxygen Activity Tolerance: Patient limited by lethargy;Patient limited by pain;Other (comment) (cognition) Patient left: in bed;with call bell/phone within reach;with bed alarm set     Time: [id number] PT Time Calculation (min) (ACUTE ONLY): 21 min  Charges:  $Therapeutic Exercise: 8-22 mins                    G Codes:      Larae Grooms, PTA 07/26/2016, 12:23 PM

## 2016-07-26 NOTE — Progress Notes (Signed)
[facility] [place] NAME: Andrew [name]    MR#:  [id number]  DATE OF BIRTH:  1923/06/16  SUBJECTIVE:  CHIEF COMPLAINT:   Chief Complaint  Patient presents with  . Abnormal Lab   came with confusion, he have chronic vascular issues on his leg- have confusion. Found to have cellulitis and bacteremia. Also have acute renal failure.   Pt remains confused today.  Found to have occlusion in his arteries on the right lower extremity. Today relating care and vascular team had discussion with the patient's neighbor and family and finally came to conclusion off keeping him as DO NOT RESUSCITATE and they will continue to talk for further decision about his care from now on word.  REVIEW OF SYSTEMS:  Because of confusion, not giving much details. ROS  DRUG ALLERGIES:   Allergies  Allergen Reactions  . Keflex [Cephalexin] Other (See Comments)    Patient states this medication made his legs hurt really bad.    VITALS:  Blood pressure (!) 119/56, pulse 79, temperature 98 F (36.7 C), resp. rate (!) 22, height 5\' 9"  (1.753 m), weight 73 kg (160 lb 14.4 oz), SpO2 100 %.  PHYSICAL EXAMINATION:  GENERAL:  83 y.o.-year-old patient lying in the bed with no acute distress.  EYES: Pupils equal, round, reactive to light and accommodation. No scleral icterus. Extraocular muscles intact.  HEENT: Head atraumatic, normocephalic. Oropharynx and nasopharynx clear. Dry mucosa. NECK:  Supple, no jugular venous distention. No thyroid enlargement, no tenderness.  LUNGS: Normal breath sounds bilaterally, no wheezing, rales,rhonchi or crepitation. No use of accessory muscles of respiration.  CARDIOVASCULAR: S1, S2 normal. No murmurs, rubs, or gallops.  ABDOMEN: Soft, nontender, nondistended. Bowel sounds present. No organomegaly or mass.  EXTREMITIES: right leg- redness, non palpable pulses, superficial ulceration , cyanosis, or clubbing. Both legs red. NEUROLOGIC: pt  is alert, speaks few words in confusion, but not following commands. PSYCHIATRIC: The patient is alert and some confused.  SKIN: as mentioned above.   Physical Exam LABORATORY PANEL:   CBC  Recent Labs Lab 07/26/16 0410  WBC 17.0*  HGB 10.4*  HCT 30.4*  PLT 1,120*   ------------------------------------------------------------------------------------------------------------------  Chemistries   Recent Labs Lab 07/23/16 1731  07/25/16 1724 07/26/16 0410  NA 132*  < >  --  146*  K 3.7  < >  --  3.2*  CL 86*  < >  --  112*  CO2 24  < >  --  24  GLUCOSE 144*  < >  --  107*  BUN 146*  < >  --  97*  CREATININE 7.82*  < >  --  3.32*  CALCIUM 8.3*  < >  --  8.3*  MG  --   --  2.3  --   AST 71*  --   --   --   ALT 29  --   --   --   ALKPHOS 82  --   --   --   BILITOT 0.4  --   --   --   < > = values in this interval not displayed. ------------------------------------------------------------------------------------------------------------------  Cardiac Enzymes  Recent Labs Lab 07/23/16 1731 07/25/16 0353  TROPONINI 0.08* 0.28*   ------------------------------------------------------------------------------------------------------------------  RADIOLOGY:  [place] Lower Ext Art Bilat  Result Date: 07/25/2016 CLINICAL DATA:  Peripheral arterial disease and right lower extremity ulceration. EXAM: BILATERAL LOWER EXTREMITY ARTERIAL DUPLEX SCAN TECHNIQUE: Gray-scale sonography as well as color Doppler and duplex  ultrasound was performed to evaluate the arteries of both lower extremities. COMPARISON:  None. FINDINGS: Right lower extremity: ABI not obtained. The right common femoral artery is patent with monophasic waveform. The proximal right profunda femoral artery has an elevated peak systolic velocity measuring up to 255 cm/sec and suggestive for a stenosis. The right superficial femoral artery is occluded at the origin. Evidence for an occluded stent in the mid right SFA. Distal  right SFA is occluded. Right popliteal artery is occluded. There appears to be minimal flow in the right anterior tibial artery. There is also some flow in the right posterior tibial artery. Left lower extremity: ABI not obtained. Left common femoral artery is patent with a monophasic waveform. Left profunda femoral artery is patent with monophasic waveform. Left SFA has some atherosclerotic disease but it is patent with a monophasic waveform. Echogenic plaque in the left popliteal artery. Left popliteal artery is patent with a monophasic waveform. Monophasic waveform in the left posterior tibial artery. Monophasic waveform in the anterior tibial artery. IMPRESSION: Occlusion of the right SFA and right popliteal artery. Left lower extremity arteries are patent. Electronically Signed   By: Markus Daft M.D.   On: 07/25/2016 20:25    ASSESSMENT AND PLAN:   Active Problems:   Sepsis ([place])   Pressure injury of skin  1. Sepsis, possibly secondary to cellulitis, lower extremity wounds, bacteremia with enterococcus  -IV Zosyn and Vanco- switched to unasyn per ID. -Follow-up blood, urine, wound cultures -Follow lactate -IV fluid hydration -Wound consult, vascular consult and ID consult. -Pain control - get TTE as per ID. - pt have arterial occlusions on right LE- called vascular for follow up. 2. AKI -IV fluid hydration and repeat BMP in a.m. -Hold nephrotoxic medications (Cozaar, HCTZ) -Renal ultrasound showed medical renal disease -Monitor Urine Output , Foley catheter evidence of urinary retention - nephrology consult appreciated. - slight improvement. 3. Thrombocytosis, acute on chronic possibly reactive -Repeat CBC in a.m.  -call hematology. 4. Elevated troponin -Likely secondary to demand ischemia  follow on telemetry , troponin slightly high- may be due to infection and renal failure.  monitor. 5. PAD -appreciated vascular consult- suggest conservative management for now. -Continue  Eliquis and Plavix   As per doppler have occlusion in arteries, called vascular.  6. History of hyperlipidemia -Continue Lipitor 7. History of asthma and allergies -Continue Allegra, fluticasone, Advair, Spiriva 8. History of GERD-continue Prilosec 9. History of hypertension -Hold Cozaar and HCTZ  10. Hypokalemia   Replace oral, this may be the reason for PVCs   Mg is normal.    All the records are reviewed and case discussed with Care Management/Social Workerr. Management plans discussed with the patient, family and they are in agreement.  CODE STATUS: DNR  TOTAL TIME TAKING CARE OF THIS PATIENT: 35 minutes.   Palliative care consult to decide goals of care. After talking to palliative care patient's neighbor and son decided to make him DO NOT RESUSCITATE.  POSSIBLE D/C IN 2-3 DAYS, DEPENDING ON CLINICAL CONDITION.   Vaughan Basta M.D on 07/26/2016   Between 7am to 6pm - Pager - [phone]  After 6pm go to [email or web address] - password [email or web address] [facility]  Office  [phone]  CC: Primary care physician; Adin Hector, MD  Note: This dictation was prepared with Dragon dictation along with smaller phrase technology. Any transcriptional errors that result from this process are unintentional.

## 2016-07-27 ENCOUNTER — Ambulatory Visit: Payer: Medicare Other | Admitting: Podiatry

## 2016-07-27 DIAGNOSIS — L97919 Non-pressure chronic ulcer of unspecified part of right lower leg with unspecified severity: Secondary | ICD-10-CM

## 2016-07-27 DIAGNOSIS — I739 Peripheral vascular disease, unspecified: Secondary | ICD-10-CM

## 2016-07-27 DIAGNOSIS — N179 Acute kidney failure, unspecified: Secondary | ICD-10-CM

## 2016-07-27 DIAGNOSIS — Z66 Do not resuscitate: Secondary | ICD-10-CM

## 2016-07-27 DIAGNOSIS — Z7189 Other specified counseling: Secondary | ICD-10-CM

## 2016-07-27 DIAGNOSIS — Z515 Encounter for palliative care: Secondary | ICD-10-CM

## 2016-07-27 LAB — BASIC METABOLIC PANEL
Anion gap: 6 (ref 5–15)
BUN: 74 mg/dL — AB (ref 6–20)
CHLORIDE: 117 mmol/L — AB (ref 101–111)
CO2: 25 mmol/L (ref 22–32)
CREATININE: 2.24 mg/dL — AB (ref 0.61–1.24)
Calcium: 8.3 mg/dL — ABNORMAL LOW (ref 8.9–10.3)
GFR calc Af Amer: 28 mL/min — ABNORMAL LOW (ref >60)
GFR calc non Af Amer: 24 mL/min — ABNORMAL LOW (ref >60)
GLUCOSE: 169 mg/dL — AB (ref 65–99)
Potassium: 4.3 mmol/L (ref 3.5–5.1)
SODIUM: 148 mmol/L — AB (ref 135–145)

## 2016-07-27 MED ORDER — NYSTATIN 100000 UNIT/GM EX POWD
Freq: Three times a day (TID) | CUTANEOUS | Status: DC
  Administered 2016-07-27 – 2016-07-31 (×13): via TOPICAL
  Filled 2016-07-27: qty 15

## 2016-07-27 NOTE — Progress Notes (Signed)
Daily Progress Note   Patient Name: Andrew [name]       Date: 07/27/2016 DOB: 1922-10-22  Age: 83 y.o. MRN#: [id number] Attending Physician: Andrew Basta, MD Primary Care Physician: Andrew Hector, MD Admit Date: 07/23/2016  Reason for Consultation/Follow-up: Establishing goals of care  Subjective: Met with patient, neighbor Andrew [name]), and son Andrew [name]) at bedside. Andrew [name] is drowsy but will wake to voice, answer questions, and follows some commands. Andrew [name] is able to get him to take his pills and eat/drink from his breakfast tray.   Discussed in detail hospitalization, diagnoses, poor prognosis, and GOC. Discussed comfort approach and hospice facility. I attempted to include Andrew [name] in the conversation and asked what it is he wants for himself knowing how sick he is? I do not think he understands the severity of his condition and he tells [place] "no" to going to a hospice home. Andrew [name] remains hopeful because he is more alert today and has drank several drinks for her during our conversation. 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 Discussed the option of going home with hospice services, if Andrew [name] would be willing to care for him. She visits often and does not want him to be alone when he dies, but would not want to take responsibility of him 24/7.   Andrew [name] again agree with DNR status but not ready to transition to full comfort measures. They would like to continue IVF and antibiotics to see how he is in the next 24 hours. Andrew [name] is very tearful during our conversation and seems to understand poor prognosis but not ready for him to leave the hospital yet.   Answered questions and concerns. Encouraged Andrew [name] to read [facility] copy.   Length of Stay:  3  Current Medications: Scheduled Meds:  . albuterol  2.5 mg Nebulization BID  . ampicillin-sulbactam (UNASYN) IV  3 g Intravenous Q12H  . apixaban  2.5 mg Oral BID  . aspirin EC  81 mg Oral Daily  . atorvastatin  20 mg Oral QPM  . Chlorhexidine Gluconate Cloth  6 each Topical Q0600  . cholecalciferol  1,000 Units Oral BH-q7a  . clopidogrel  75 mg Oral Daily  . feeding supplement (ENSURE ENLIVE)  237 mL Oral TID WC  . fluticasone  2 spray Each Nare Daily  . hydroxyurea  500 mg Oral Daily  . levothyroxine  75 mcg Oral QAC breakfast  . loratadine  10 mg Oral Daily  . mometasone-formoterol  2 puff Inhalation BID  . multivitamin-lutein  1 capsule Oral BID  . mupirocin ointment  1 application Nasal BID  . nystatin   Topical TID  . pantoprazole  40 mg Oral QAC breakfast  . polyvinyl alcohol  1-2 drop Both Eyes Daily  . tiotropium  18 mcg Inhalation Daily  . vitamin B-12  2,500 mcg Oral BH-q7a  . vitamin E  400 Units Oral BH-q7a    Continuous Infusions: . 0.45 % NaCl with KCl 20 mEq / L 75 mL/hr at 07/27/16 1358    PRN Meds: acetaminophen **OR** acetaminophen, albuterol, bisacodyl, magnesium citrate, naphazoline-glycerin, ondansetron **OR** ondansetron (ZOFRAN) IV, oxyCODONE, senna-docusate, traMADol  Physical Exam  Constitutional: He is easily aroused. He appears ill.  HENT:  Head: Normocephalic and atraumatic.  Cardiovascular: Regular rhythm and normal heart sounds.   Pulmonary/Chest: No accessory muscle usage. No tachypnea. He has decreased breath sounds.  Abdominal: Normal appearance.  Neurological: He is alert and easily aroused.  Oriented to person and place  Skin: Skin is warm and dry. There is pallor.  Psychiatric: His speech is delayed. He is inattentive.  Nursing note and vitals reviewed.          Vital Signs: BP (!) 110/48 (BP Location: Left Arm)   Pulse (!) 110   Temp 98.7 F (37.1 C) (Oral)   Resp 20   Ht _0  (1.753 m)   Wt 73 kg (160 lb 14.4 oz)   SpO2  99%   BMI 23.76 kg/m  SpO2: SpO2: 99 % O2 Device: O2 Device: Nasal Cannula O2 Flow Rate: O2 Flow Rate (L/min): 3 L/min  Intake/output summary:   Intake/Output Summary (Last 24 hours) at 07/27/16 1627 Last data filed at 07/27/16 0308  Gross per 24 hour  Intake          1353.75 ml  Output                0 ml  Net          1353.75 ml   LBM: Last BM Date: 07/23/16 Baseline Weight: Weight: 73.3 kg (161 lb 11.2 oz) Most recent weight: Weight: 73 kg (160 lb 14.4 oz)  Palliative Assessment/Data: PPS 30%   Flowsheet Rows   Flowsheet Row Most Recent Value  Intake Tab  Referral Department  Hospitalist  Unit at Time of Referral  Oncology Unit  Palliative Care Primary Diagnosis  Sepsis/Infectious Disease  Date Notified  07/25/16  Palliative Care Type  New Palliative care  Reason for referral  Clarify Goals of Care  Date of Admission  07/23/16  Date first seen by Palliative Care  07/26/16  # of days Palliative referral response time  1 Day(s)  # of days IP prior to Palliative referral  2  Clinical Assessment  Palliative Performance Scale Score  30%  Psychosocial & Spiritual Assessment  Palliative Care Outcomes  Patient/Family meeting held?  Yes  Who was at the meeting?  patient, neighbor Andrew [name]), and son Andrew [name])  [place] goals of care, Counseled regarding hospice, Provided psychosocial or spiritual support, Provided end of life care assistance, ACP counseling assistance      Patient Active Problem List   Diagnosis Date Noted  . Acute renal failure ([place])   . Lower extremity ulceration, right, with  unspecified severity ([place])   . Palliative care by specialist   . Goals of care, counseling/discussion   . DNR (do not resuscitate)   . Sepsis ([place]) 07/24/2016  . Pressure injury of skin 07/24/2016  . Peripheral vascular disease ([place]) 06/01/2016  . Lower limb ulcer, ankle, right, limited to breakdown of skin ([place]) 04/14/2016  . Hyperlipidemia 04/14/2016   . Essential hypertension 04/14/2016  . Atherosclerosis of native artery of right lower extremity with ulceration of ankle ([place]) 04/14/2016  . Swelling of limb 04/14/2016  . Cellulitis 03/27/2016  . Cold foot with peripheral vascular disease ([place]) 03/27/2016  . Chronic venous hypertension with ulcer ([place]) 01/13/2016    Palliative Care Assessment & Plan   Patient Profile: 83 y.o. male  with past medical history of CKD, HTN, PVD/PAD with multiple revascularizations, and asthma admitted on 07/23/2016 with acute renal failure from [facility]. He has bilateral nonhealing ulcerations of lower extremities, R>L. This admit, found to have cellulitis, bacteremia, and acute renal failure. He is a known patient of vascular surgery due to multiple previous revascularizations to right lower extremity. Patient has been told he would need an amputation if pain was intractable or infection was not controlled by wound care and antibiotics. 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 Conservative management for now. Followed by nephrology for renal failure and also known patient to the practice-receiving IVF and supportive care. Followed by infectious disease for bacteremia and receiving unasyn. Palliative medicine consultation for goals of care.  Assessment: Sepsis Bacteremia Acute renal failure  Chronic kidney disease Lower extremity ulceration Peripheral artery disease Thrombocytosis   Recommendations/Plan:  Discussed in detail with family diagnoses, poor prognosis, GOC, and hospice facility. They seem to understand poor prognosis but not ready for transition to full comfort. Would like to continue fluids and antibiotics and see how he is in the next 24 hours.   I will f/u with family on 1/26 at 9:30am to further discuss prognosis and hospice.   Code Status: DNR   Code Status Orders        Start     Ordered   07/26/16 1511  Do not attempt resuscitation (DNR)  Continuous     Question Answer Comment  In the event of cardiac or respiratory ARREST Do not call a "code blue"   In the event of cardiac or respiratory ARREST Do not perform Intubation, CPR, defibrillation or ACLS   In the event of cardiac or respiratory ARREST Use medication by any route, position, wound care, and other measures to relive pain and suffering. May use oxygen, suction and manual treatment of airway obstruction as needed for comfort.      07/26/16 1510    Code Status History    Date Active Date Inactive Code Status Order ID Comments User Context   07/24/2016  3:43 AM 07/26/2016  3:10 PM Full Code [id number]  [facility], DO Inpatient   06/04/2016  7:53 AM 06/06/2016  9:17 PM Full Code [id number]  Terrilyn Saver, RN Inpatient   03/27/2016  5:39 PM 03/31/2016  7:32 PM Full Code [id number]  Andrew Basta, MD Inpatient   02/07/2016 12:31 PM 02/07/2016  6:41 PM Full Code [id number]  Algernon Huxley, MD Inpatient       Prognosis:   Unable to determine  Discharge Planning:  To Be Determined  Care plan was discussed with patient, family, RN, RN CM, and Dr. Anselm Jungling  Thank you for allowing the Palliative Medicine Team to  assist in the care of this patient.   Time In: 0915 Time Out: 1030 Total Time 46mn Prolonged Time Billed  yes       Greater than 50%  of this time was spent counseling and coordinating care related to the above assessment and plan.  MIhor Dow FNP-C Palliative Medicine Team  Phone: [phone]: [phone] Please contact Palliative Medicine Team phone at [phone] questions and concerns.

## 2016-07-27 NOTE — Care Management Important Message (Signed)
Important Message  Patient Details  Name: Andrew [name] MRN: [id number] Date of Birth: 01-Oct-1922   Medicare Important Message Given:  Yes    Shelbie Ammons, RN 07/27/2016, 8:36 AM

## 2016-07-27 NOTE — Progress Notes (Signed)
Physical Therapy Treatment Patient Details Name: Andrew [name] MRN: [id number] DOB: 31-Jul-1922 Today's Date: 07/27/2016    History of Present Illness Pt is a 83 y.o. male presenting to hospital from facility with elevated BUN.  Pt admitted to hospital with sepsis (probably secondary to cellulitis and LE wounds) and AKI.  Per chart review pt with chronic nonhealing arterial ulcers R posterior lower leg and R lateral lower leg.  Pt with multiple previous revascularization procedures R LE (essentially no run-off distally with non-reconstructable vascular disease).  PMH includes CKD, htn, PVD, asthma.    PT Comments    Pt with improved cognition/awareness to day. Pt agreeable to PT and initially denies pain at rest. Pt agreeable to attempt sitting edge of bed. Requires Mod A for bed mobility; pt gives good effort, but is challenging. Assist for Bilateral lower extremities and trunk. Once sitting, pt able to scoot hips to edge of bed. Pain intensifies in Right lower extremity and pt requests to return to bed. Requires Max x 2 to reposition upward in bed. It is noted that Right lower extremity with several spots of active bleeding. Nursing to address. Lab in for blood work and Psychologist, counselling to bathe pt. No further PT at this time.   Follow Up Recommendations  SNF     Equipment Recommendations       Recommendations for Other Services       Precautions / Restrictions Precautions Precautions: Fall Restrictions Weight Bearing Restrictions: No    Mobility  Bed Mobility Overal bed mobility: Needs Assistance Bed Mobility: Supine to Sit;Sit to Supine     Supine to sit: Mod assist;HOB elevated Sit to supine: Mod assist;HOB elevated   General bed mobility comments: 2 person Max assist to reposition upward in bed  Transfers                 General transfer comment: Not tested; pt experiencing too much pain in sit in LLE; requests to return to supine  Ambulation/Gait                 Stairs            Wheelchair Mobility    Modified Rankin (Stroke Patients Only)       Balance                                    Cognition Arousal/Alertness: Awake/alert Behavior During Therapy: WFL for tasks assessed/performed Overall Cognitive Status: Within Functional Limits for tasks assessed                      Exercises      General Comments        Pertinent Vitals/Pain Faces Pain Scale: Hurts whole lot Pain Location: LLE with touch/movement    Home Living                      Prior Function            PT Goals (current goals can now be found in the care plan section) Progress towards PT goals: Progressing toward goals    Frequency    Min 2X/week      PT Plan Current plan remains appropriate    Co-evaluation             End of Session Equipment Utilized During Treatment: Oxygen Activity Tolerance: Patient limited by pain  Patient left: in bed;with call bell/phone within reach;with family/visitor present;with nursing/sitter in room;Other (comment) (monitor)     Time: [id number] PT Time Calculation (min) (ACUTE ONLY): 17 min  Charges:  $Therapeutic Activity: 8-22 mins                    G CodesLarae Grooms, PTA 07/27/2016, 12:47 PM

## 2016-07-27 NOTE — Progress Notes (Signed)
[place] [facility] [place] NAME: Andrew [name]    MR#:  [id number]  DATE OF BIRTH:  27-Jun-1923  SUBJECTIVE:  CHIEF COMPLAINT:   Chief Complaint  Patient presents with  . Abnormal Lab   came with confusion, he have chronic vascular issues on his leg- have confusion. Found to have cellulitis and bacteremia. Also have acute renal failure.   Pt remains confused today.  Found to have occlusion in his arteries on the right lower extremity. Today relating care and vascular team had discussion with the patient's neighbor and family and finally came to conclusion off keeping him as DO NOT RESUSCITATE and they will continue to talk for further decision about his care from now onward.   After having a meeting with the his neighbor and son, palliative care came to conclusion about her keeping him on IV fluids and antibiotics but they are not ready for hospice care at this time.  REVIEW OF SYSTEMS:  Because of confusion, not giving much details. ROS  DRUG ALLERGIES:   Allergies  Allergen Reactions  . Keflex [Cephalexin] Other (See Comments)    Patient states this medication made his legs hurt really bad.    VITALS:  Blood pressure (!) 110/48, pulse (!) 110, temperature 98.7 F (37.1 C), temperature source Oral, resp. rate 20, height 5\' 9"  (1.753 m), weight 73 kg (160 lb 14.4 oz), SpO2 99 %.  PHYSICAL EXAMINATION:  GENERAL:  83 y.o.-year-old patient lying in the bed with no acute distress.  EYES: Pupils equal, round, reactive to light and accommodation. No scleral icterus. Extraocular muscles intact.  HEENT: Head atraumatic, normocephalic. Oropharynx and nasopharynx clear. Dry mucosa. NECK:  Supple, no jugular venous distention. No thyroid enlargement, no tenderness.  LUNGS: Normal breath sounds bilaterally, no wheezing, rales,rhonchi or crepitation. No use of accessory muscles of respiration.  CARDIOVASCULAR: S1, S2 normal. No murmurs, rubs, or gallops.   ABDOMEN: Soft, nontender, nondistended. Bowel sounds present. No organomegaly or mass.  EXTREMITIES: right leg- redness, non palpable pulses, superficial ulceration , cyanosis, or clubbing. Both legs red. NEUROLOGIC: pt is alert, speaks few words in confusion, but not following commands. PSYCHIATRIC: The patient is alert and some confused.  SKIN: as mentioned above.   Physical Exam LABORATORY PANEL:   CBC  Recent Labs Lab 07/26/16 0410  WBC 17.0*  HGB 10.4*  HCT 30.4*  PLT 1,120*   ------------------------------------------------------------------------------------------------------------------  Chemistries   Recent Labs Lab 07/23/16 1731  07/25/16 1724  07/27/16 0802  NA 132*  < >  --   < > 148*  K 3.7  < >  --   < > 4.3  CL 86*  < >  --   < > 117*  CO2 24  < >  --   < > 25  GLUCOSE 144*  < >  --   < > 169*  BUN 146*  < >  --   < > 74*  CREATININE 7.82*  < >  --   < > 2.24*  CALCIUM 8.3*  < >  --   < > 8.3*  MG  --   --  2.3  --   --   AST 71*  --   --   --   --   ALT 29  --   --   --   --   ALKPHOS 82  --   --   --   --   BILITOT 0.4  --   --   --   --   < > =  values in this interval not displayed. ------------------------------------------------------------------------------------------------------------------  Cardiac Enzymes  Recent Labs Lab 07/23/16 1731 07/25/16 0353  TROPONINI 0.08* 0.28*   ------------------------------------------------------------------------------------------------------------------  RADIOLOGY:  [place] Lower Ext Art Bilat  Result Date: 07/25/2016 CLINICAL DATA:  Peripheral arterial disease and right lower extremity ulceration. EXAM: BILATERAL LOWER EXTREMITY ARTERIAL DUPLEX SCAN TECHNIQUE: Gray-scale sonography as well as color Doppler and duplex ultrasound was performed to evaluate the arteries of both lower extremities. COMPARISON:  None. FINDINGS: Right lower extremity: ABI not obtained. The right common femoral artery is patent  with monophasic waveform. The proximal right profunda femoral artery has an elevated peak systolic velocity measuring up to 255 cm/sec and suggestive for a stenosis. The right superficial femoral artery is occluded at the origin. Evidence for an occluded stent in the mid right SFA. Distal right SFA is occluded. Right popliteal artery is occluded. There appears to be minimal flow in the right anterior tibial artery. There is also some flow in the right posterior tibial artery. Left lower extremity: ABI not obtained. Left common femoral artery is patent with a monophasic waveform. Left profunda femoral artery is patent with monophasic waveform. Left SFA has some atherosclerotic disease but it is patent with a monophasic waveform. Echogenic plaque in the left popliteal artery. Left popliteal artery is patent with a monophasic waveform. Monophasic waveform in the left posterior tibial artery. Monophasic waveform in the anterior tibial artery. IMPRESSION: Occlusion of the right SFA and right popliteal artery. Left lower extremity arteries are patent. Electronically Signed   By: Markus Daft M.D.   On: 07/25/2016 20:25    ASSESSMENT AND PLAN:   Active Problems:   Sepsis ([place])   Pressure injury of skin   Acute renal failure (H[facility])   Lower extremity ulceration, right, with unspecified severity ([place])   Palliative care by specialist   Goals of care, counseling/discussion   DNR (do not resuscitate)  1. Sepsis, possibly secondary to cellulitis, lower extremity wounds, bacteremia with enterococcus  -IV Zosyn and Vanco- switched to unasyn per ID. -Follow-up blood, urine, wound cultures -Follow lactate -IV fluid hydration -Wound consult, vascular consult and ID consult. -Pain control - get TTE as per ID. - pt have arterial occlusions on right LE- called vascular for follow up. - After meeting with [facility] patient's family and caretakers decided to keep treating him for antibiotic and IV fluid for now and  they are not ready for hospice at this point. We'll have to continue following with them daily. Patient has very poor oral intake and with multiple infected ulcers and vascular issues he has poor prognosis. 2. AKI -IV fluid hydration and repeat BMP in a.m. -Hold nephrotoxic medications (Cozaar, HCTZ) -Renal ultrasound showed medical renal disease -Monitor Urine Output , Foley catheter evidence of urinary retention - nephrology consult appreciated. - slight improvement. 3. Thrombocytosis, acute on chronic possibly reactive -Repeat CBC in a.m.  -call hematology.   Started on hydroxyurea. 4. Elevated troponin -Likely secondary to demand ischemia  follow on telemetry , troponin slightly high- may be due to infection and renal failure.  monitor. 5. PAD -appreciated vascular consult- suggest conservative management for now. -Continue Eliquis and Plavix   As per doppler have occlusion in arteries, called vascular. - as per Dr. Lucky Cowboy- vascular reconstruction is not an option and suggested to have hospice.  6. History of hyperlipidemia -Continue Lipitor 7. History of asthma and allergies -Continue Allegra, fluticasone, Advair, Spiriva 8. History of GERD-continue Prilosec 9. History of hypertension -Hold Cozaar and HCTZ  10. Hypokalemia   Replace oral, this may be the reason for PVCs   Mg is normal.    All the records are reviewed and case discussed with Care Management/Social Workerr. Management plans discussed with the patient, family and they are in agreement.  CODE STATUS: DNR  TOTAL TIME TAKING CARE OF THIS PATIENT: 35 minutes.   Palliative care consult to decide goals of care. After talking to palliative care patient's neighbor and son decided to make him DO NOT RESUSCITATE.  POSSIBLE D/C IN 2-3 DAYS, DEPENDING ON CLINICAL CONDITION.   Vaughan Basta M.D on 07/27/2016   Between 7am to 6pm - Pager - [phone]  After 6pm go to [email or web address] - password [email or web address]  [facility]  Office  [phone]  CC: Primary care physician; Adin Hector, MD  Note: This dictation was prepared with Dragon dictation along with smaller phrase technology. Any transcriptional errors that result from this process are unintentional.

## 2016-07-28 DIAGNOSIS — Z515 Encounter for palliative care: Secondary | ICD-10-CM

## 2016-07-28 MED ORDER — AMOXICILLIN-POT CLAVULANATE 600-42.9 MG/5ML PO SUSR
600.0000 mg | Freq: Two times a day (BID) | ORAL | Status: DC
  Administered 2016-07-29 – 2016-07-31 (×5): 600 mg via ORAL
  Filled 2016-07-28 (×7): qty 5

## 2016-07-28 MED ORDER — BISACODYL 10 MG RE SUPP
10.0000 mg | Freq: Once | RECTAL | Status: AC
  Administered 2016-07-28: 10 mg via RECTAL
  Filled 2016-07-28: qty 1

## 2016-07-28 MED ORDER — LORAZEPAM 2 MG/ML IJ SOLN: 0.5000 mg | INTRAMUSCULAR | Status: DC | PRN

## 2016-07-28 MED ORDER — MORPHINE SULFATE (CONCENTRATE) 10 MG/0.5ML PO SOLN
5.0000 mg | ORAL | Status: DC | PRN
  Administered 2016-07-29 – 2016-07-31 (×5): 5 mg via SUBLINGUAL
  Filled 2016-07-28 (×5): qty 1

## 2016-07-28 NOTE — Progress Notes (Signed)
Subjective:    Patient renal function continues to improve, today's creatinine 2.24, BUN is 74. Patient remains lethargic, awakens to verbal stimuli but immediately goes back to sleep.  Unable to answer questions. Appears to have eaten some breakfast based on bedside tray.   Objective:  Vital signs in last 24 hours:  Temp:  [98.1 F (36.7 C)] 98.1 F (36.7 C) (01/26 1347) Pulse Rate:  [56-120] 107 (01/26 1347) Resp:  [19-20] 20 (01/26 1347) BP: (112-133)/(62-98) 112/98 (01/26 1347) SpO2:  [97 %-100 %] 98 % (01/26 1347)  Weight change:  Filed Weights   07/23/16 1725 07/24/16 0356  Weight: 73.3 kg (161 lb 11.2 oz) 73 kg (160 lb 14.4 oz)    Intake/Output:    Intake/Output Summary (Last 24 hours) at 07/28/16 1425 Last data filed at 07/28/16 0330  Gross per 24 hour  Intake           2027.5 ml  Output                0 ml  Net           2027.5 ml     Physical Exam: General: Critically ill-appearing   HEENT Dry oral mucous membranes   Neck Supple  Pulm/lungs Shallow breathing effort, mild bibasilar crackles, diminished at bases  CVS/Heart Irregular, tachycardia  Abdomen:  Soft, nontender   Extremities: Redness, edema over bilateral lower extremities, foul smelling wounds, c/d/i  Neurologic: Mumbling, unable to answer questions  Skin:  decreased turgor           Basic Metabolic Panel:   Recent Labs Lab 07/23/16 1731 07/24/16 0446 07/25/16 0353 07/25/16 1724 07/26/16 0410 07/27/16 0802  NA 132* 135 143  --  146* 148*  K 3.7 3.3* 3.0*  --  3.2* 4.3  CL 86* 93* 103  --  112* 117*  CO2 24 24 23   --  24 25  GLUCOSE 144* 96 88  --  107* 169*  BUN 146* 133* 116*  --  97* 74*  CREATININE 7.82* 6.58* 5.11*  --  3.32* 2.24*  CALCIUM 8.3* 7.7* 8.1*  --  8.3* 8.3*  MG  --   --   --  2.3  --   --      CBC:  Recent Labs Lab 07/23/16 1731 07/24/16 0446 07/25/16 0353 07/26/16 0410  WBC 23.9* 19.8* 16.6* 17.0*  NEUTROABS 21.6*  --   --   --   HGB 11.9* 10.2*  10.0* 10.4*  HCT 34.7* 30.1* 28.7* 30.4*  MCV 90.0 91.0 91.5 92.7  PLT 1,457* 1,256* 1,107* 1,120*      Microbiology:  Recent Results (from the past 720 hour(s))  Urine culture     Status: Abnormal   Collection Time: 07/11/16  9:00 AM  Result Value Ref Range Status   Specimen Description URINE, RANDOM  Final   Special Requests NONE  Final   Culture MULTIPLE SPECIES PRESENT, SUGGEST RECOLLECTION (A)  Final   Report Status 07/13/2016 FINAL  Final  Blood culture (routine x 2)     Status: Abnormal   Collection Time: 07/23/16  6:49 PM  Result Value Ref Range Status   Specimen Description BLOOD LEFT HAND  Final   Special Requests   Final    BOTTLES DRAWN AEROBIC AND ANAEROBIC ANA13ML AER12ML   Culture  Setup Time   Final    GRAM POSITIVE COCCI ANAEROBIC BOTTLE ONLY CRITICAL RESULT CALLED TO, READ BACK BY AND VERIFIED WITH: CHRISTINE KATSOUDAS ON  07/24/16 AT 1245 QSD Performed at [facility], [place] [address], [place], [place] [address]    Culture ENTEROCOCCUS FAECALIS (A)  Final   Report Status 07/26/2016 FINAL  Final   Organism ID, Bacteria ENTEROCOCCUS FAECALIS  Final      Susceptibility   Enterococcus faecalis - MIC*    AMPICILLIN <=2 SENSITIVE Sensitive     VANCOMYCIN 1 SENSITIVE Sensitive     GENTAMICIN SYNERGY RESISTANT Resistant     * ENTEROCOCCUS FAECALIS  Blood culture (routine x 2)     Status: Abnormal   Collection Time: 07/23/16  6:49 PM  Result Value Ref Range Status   Specimen Description BLOOD LEFT ASSIST CONTROL  Final   Special Requests   Final    BOTTLES DRAWN AEROBIC AND ANAEROBIC ANA13ML AER13ML   Culture  Setup Time   Final    GRAM POSITIVE COCCI IN BOTH AEROBIC AND ANAEROBIC BOTTLES CRITICAL VALUE NOTED.  VALUE IS CONSISTENT WITH PREVIOUSLY REPORTED AND CALLED VALUE.    Culture (A)  Final    ENTEROCOCCUS FAECALIS SUSCEPTIBILITIES PERFORMED ON PREVIOUS CULTURE WITHIN THE LAST 5 DAYS. Performed at [facility], [place] [address]  [place],  [address]    Report Status 07/26/2016 FINAL  Final  Blood Culture ID Panel (Reflexed)     Status: Abnormal   Collection Time: 07/23/16  6:49 PM  Result Value Ref Range Status   Enterococcus species DETECTED (A) NOT DETECTED Final    Comment: CRITICAL RESULT CALLED TO, READ BACK BY AND VERIFIED WITH: CHRISTINE KATSOUDAS ON 07/24/16 AT 1245 QSD    Vancomycin resistance NOT DETECTED NOT DETECTED Final   Listeria monocytogenes NOT DETECTED NOT DETECTED Final   Staphylococcus species NOT DETECTED NOT DETECTED Final   Staphylococcus aureus NOT DETECTED NOT DETECTED Final   Streptococcus species NOT DETECTED NOT DETECTED Final   Streptococcus agalactiae NOT DETECTED NOT DETECTED Final   Streptococcus pneumoniae NOT DETECTED NOT DETECTED Final   Streptococcus pyogenes NOT DETECTED NOT DETECTED Final   Acinetobacter baumannii NOT DETECTED NOT DETECTED Final   Enterobacteriaceae species NOT DETECTED NOT DETECTED Final   Enterobacter cloacae complex NOT DETECTED NOT DETECTED Final   Escherichia coli NOT DETECTED NOT DETECTED Final   Klebsiella oxytoca NOT DETECTED NOT DETECTED Final   Klebsiella pneumoniae NOT DETECTED NOT DETECTED Final   Proteus species NOT DETECTED NOT DETECTED Final   Serratia marcescens NOT DETECTED NOT DETECTED Final   Haemophilus influenzae NOT DETECTED NOT DETECTED Final   Neisseria meningitidis NOT DETECTED NOT DETECTED Final   Pseudomonas aeruginosa NOT DETECTED NOT DETECTED Final   Candida albicans NOT DETECTED NOT DETECTED Final   Candida glabrata NOT DETECTED NOT DETECTED Final   Candida krusei NOT DETECTED NOT DETECTED Final   Candida parapsilosis NOT DETECTED NOT DETECTED Final   Candida tropicalis NOT DETECTED NOT DETECTED Final  MRSA PCR Screening     Status: Abnormal   Collection Time: 07/24/16  4:49 AM  Result Value Ref Range Status   MRSA by PCR POSITIVE (A) NEGATIVE Final    Comment:        The GeneXpert MRSA Assay (FDA approved for NASAL  specimens only), is one component of a comprehensive MRSA colonization surveillance program. It is not intended to diagnose MRSA infection nor to guide or monitor treatment for MRSA infections. RESULT CALLED TO, READ BACK BY AND VERIFIED WITH: PHYLLIS KING ON 07/24/16 AT 0649 BY [facility]   Culture, blood (single) w Reflex to ID Panel  Status: None (Preliminary result)   Collection Time: 07/24/16  2:49 PM  Result Value Ref Range Status   Specimen Description BLOOD LEFT AC  Final   Special Requests   Final    BOTTLES DRAWN AEROBIC AND ANAEROBIC AER 10ML ANA 14ML   Culture NO GROWTH 4 DAYS  Final   Report Status PENDING  Incomplete  CULTURE, BLOOD (ROUTINE X 2) w Reflex to ID Panel     Status: None (Preliminary result)   Collection Time: 07/26/16  2:10 PM  Result Value Ref Range Status   Specimen Description BLOOD L AC  Final   Special Requests BOTTLES DRAWN AEROBIC AND ANAEROBIC  7 ML  Final   Culture NO GROWTH 2 DAYS  Final   Report Status PENDING  Incomplete  CULTURE, BLOOD (ROUTINE X 2) w Reflex to ID Panel     Status: None (Preliminary result)   Collection Time: 07/26/16  2:55 PM  Result Value Ref Range Status   Specimen Description BLOOD R AC  Final   Special Requests BOTTLES DRAWN AEROBIC AND ANAEROBIC ANA 9 AER 7 ML  Final   Culture NO GROWTH 2 DAYS  Final   Report Status PENDING  Incomplete    Coagulation Studies: No results for input(s): LABPROT, INR in the last 72 hours.  Urinalysis: No results for input(s): COLORURINE, LABSPEC, PHURINE, GLUCOSEU, HGBUR, BILIRUBINUR, KETONESUR, PROTEINUR, UROBILINOGEN, NITRITE, LEUKOCYTESUR in the last 72 hours.  Invalid input(s): APPERANCEUR    Imaging: No results found.   Medications:   . 0.45 % NaCl with KCl 20 mEq / L 75 mL/hr at 07/27/16 1358   . albuterol  2.5 mg Nebulization BID  . ampicillin-sulbactam (UNASYN) IV  3 g Intravenous Q12H  . apixaban  2.5 mg Oral BID  . aspirin EC  81 mg Oral Daily  . atorvastatin  20  mg Oral QPM  . cholecalciferol  1,000 Units Oral BH-q7a  . clopidogrel  75 mg Oral Daily  . feeding supplement (ENSURE ENLIVE)  237 mL Oral TID WC  . fluticasone  2 spray Each Nare Daily  . hydroxyurea  500 mg Oral Daily  . levothyroxine  75 mcg Oral QAC breakfast  . loratadine  10 mg Oral Daily  . mometasone-formoterol  2 puff Inhalation BID  . multivitamin-lutein  1 capsule Oral BID  . mupirocin ointment  1 application Nasal BID  . nystatin   Topical TID  . pantoprazole  40 mg Oral QAC breakfast  . polyvinyl alcohol  1-2 drop Both Eyes Daily  . tiotropium  18 mcg Inhalation Daily  . vitamin B-12  2,500 mcg Oral BH-q7a  . vitamin E  400 Units Oral BH-q7a   acetaminophen **OR** acetaminophen, albuterol, bisacodyl, magnesium citrate, naphazoline-glycerin, ondansetron **OR** ondansetron (ZOFRAN) IV, oxyCODONE, senna-docusate, traMADol  Assessment/ Plan:  83 y.o. male With asthma, chronic kidney disease, hypertension, peripheral vascular disease, nursing home resident at [facility].  Known to our practice from outpatient. He is followed by Dr. Holley Raring for CK D  Admitted for AMS which is thought to be secondary to sepsis from cellulitis of lower extremity wounds. His baseline creatinine appears to be 1.10 from December 3  1. Acute kidney injury 2. Chronic kidney disease stage III 3. Sepsis from lower extremity wounds, enterococcus bacteremia 4. Hypokalemia 5. hypernatremia  Acute kidney injury is likely secondary to severe dehydration leading to possibly ATN. Sepsis, bacteremia and cellulitis are also playing a role.  Changed fluid to 1/2 NS with KCL, K  is within a normal range today Serum creatinine and BUN continue to improve. Distal right SFA occulusion noted on [place]. Per vascular patient is not a candidate for reconstruction.   Supportive care.  Palliative Care is following.   LOS: 4 Andrew [name] 1/26/20182:25 PM

## 2016-07-28 NOTE — NC FL2 (Signed)
[place] LEVEL OF CARE SCREENING TOOL     IDENTIFICATION  Patient Name: Andrew [name] Birthdate: [birth year] Sex: male Admission Date (Current Location): 07/23/2016  [place] and [place] Number:  Engineering geologist and Address:  [facility], [address], [place],  [address]      Provider Number: [id number]  Attending Physician Name and Address:  Vaughan Basta, MD  Relative Name and Phone Number:  Ioan, [place] [phone]  [phone]     Current Level of Care: Hospital Recommended Level of Care: [place] Prior Approval Number:    Date Approved/Denied:   PASRR Number: [id number]  Discharge Plan: SNF    Current Diagnoses: Patient Active Problem List   Diagnosis Date Noted  . Terminal care   . Acute renal failure ([place])   . Lower extremity ulceration, right, with unspecified severity ([place])   . Palliative care by specialist   . Goals of care, counseling/discussion   . DNR (do not resuscitate)   . PAD (peripheral artery disease) ([place])   . Sepsis ([place]) 07/24/2016  . Pressure injury of skin 07/24/2016  . Peripheral vascular disease ([place]) 06/01/2016  . Lower limb ulcer, ankle, right, limited to breakdown of skin ([place]) 04/14/2016  . Hyperlipidemia 04/14/2016  . Essential hypertension 04/14/2016  . Atherosclerosis of native artery of right lower extremity with ulceration of ankle ([place]) 04/14/2016  . Swelling of limb 04/14/2016  . Cellulitis 03/27/2016  . Cold foot with peripheral vascular disease ([place]) 03/27/2016  . Chronic venous hypertension with ulcer ([place]) 01/13/2016    Orientation RESPIRATION BLADDER Height & Weight     Self  O2 (2L) Incontinent Weight: 160 lb 14.4 oz (73 kg) Height:  5\' 9"  (175.3 cm)  BEHAVIORAL SYMPTOMS/MOOD NEUROLOGICAL BOWEL NUTRITION STATUS      Incontinent Diet (Cardiac)  AMBULATORY STATUS COMMUNICATION OF NEEDS Skin   Limited Assist Verbally PU Stage and  Appropriate Care                       Personal Care Assistance Level of Assistance  Bathing, Feeding, Dressing Bathing Assistance: Limited assistance Feeding assistance: Limited assistance Dressing Assistance: Maximum assistance     Functional Limitations Info  Sight, Hearing, Speech Sight Info: Adequate Hearing Info: Adequate Speech Info: Adequate    SPECIAL CARE FACTORS FREQUENCY  PT (By licensed PT)     PT Frequency: 5x a week              Contractures Contractures Info: Not present    Additional Factors Info  Code Status, Allergies Code Status Info: DNR Allergies Info: KEFLEX CEPHALEXIN            Current Medications (07/28/2016):  This is the current hospital active medication list Current Facility-Administered Medications  Medication Dose Route Frequency Provider Last Rate Last Dose  . acetaminophen (TYLENOL) tablet 650 mg  650 mg Oral Q6H PRN Alexis Hugelmeyer, DO       Or  . acetaminophen (TYLENOL) suppository 650 mg  650 mg Rectal Q6H PRN Alexis Hugelmeyer, DO      . albuterol (PROVENTIL) (2.5 MG/3ML) 0.083% nebulizer solution 2.5 mg  2.5 mg Nebulization Q6H PRN Alexis Hugelmeyer, DO      . albuterol (PROVENTIL) (2.5 MG/3ML) 0.083% nebulizer solution 2.5 mg  2.5 mg Nebulization BID Alexis Hugelmeyer, DO   2.5 mg at 07/28/16 0742  . Ampicillin-Sulbactam (UNASYN) 3 g in sodium chloride 0.9 % 100 mL IVPB  3 g  Intravenous Q12H Pernell Dupre, RPH   3 g at 07/27/16 2001  . apixaban (ELIQUIS) tablet 2.5 mg  2.5 mg Oral BID Alexis Hugelmeyer, DO   2.5 mg at 07/27/16 2001  . aspirin EC tablet 81 mg  81 mg Oral Daily Alexis Hugelmeyer, DO   81 mg at 07/27/16 0941  . atorvastatin (LIPITOR) tablet 20 mg  20 mg Oral QPM Alexis Hugelmeyer, DO   20 mg at 07/27/16 1734  . bisacodyl (DULCOLAX) EC tablet 5 mg  5 mg Oral Daily PRN Alexis Hugelmeyer, DO      . cholecalciferol (VITAMIN D) tablet 1,000 Units  1,000 Units Oral BH-q7a Alexis Hugelmeyer, DO   1,000 Units at  07/28/16 0806  . clopidogrel (PLAVIX) tablet 75 mg  75 mg Oral Daily Alexis Hugelmeyer, DO   75 mg at 07/27/16 0940  . feeding supplement (ENSURE ENLIVE) (ENSURE ENLIVE) liquid 237 mL  237 mL Oral TID WC Vaughan Basta, MD   237 mL at 07/27/16 1736  . fluticasone (FLONASE) 50 MCG/ACT nasal spray 2 spray  2 spray Each Nare Daily Alexis Hugelmeyer, DO   2 spray at 07/28/16 1000  . hydroxyurea (HYDREA) capsule 500 mg  500 mg Oral Daily Cammie Sickle, MD   500 mg at 07/27/16 0939  . levothyroxine (SYNTHROID, LEVOTHROID) tablet 75 mcg  75 mcg Oral QAC breakfast Alexis Hugelmeyer, DO   75 mcg at 07/28/16 0807  . loratadine (CLARITIN) tablet 10 mg  10 mg Oral Daily Alexis Hugelmeyer, DO   10 mg at 07/27/16 0940  . LORazepam (ATIVAN) injection 0.5 mg  0.5 mg Intravenous Q4H PRN Basilio Cairo, NP      . magnesium citrate solution 1 Bottle  1 Bottle Oral Once PRN Alexis Hugelmeyer, DO      . mometasone-formoterol (DULERA) 200-5 MCG/ACT inhaler 2 puff  2 puff Inhalation BID Alexis Hugelmeyer, DO   2 puff at 07/28/16 0815  . morphine CONCENTRATE 10 MG/0.5ML oral solution 5 mg  5 mg Sublingual Q2H PRN Basilio Cairo, NP      . multivitamin-lutein (OCUVITE-LUTEIN) capsule 1 capsule  1 capsule Oral BID Alexis Hugelmeyer, DO   1 capsule at 07/27/16 2001  . mupirocin ointment (BACTROBAN) 2 % 1 application  1 application Nasal BID Vaughan Basta, MD   1 application at [id number]  . naphazoline-glycerin (CLEAR EYES) ophth solution 1-2 drop  1-2 drop Both Eyes QID PRN Alexis Hugelmeyer, DO      . nystatin (MYCOSTATIN/NYSTOP) topical powder   Topical TID Vaughan Basta, MD      . ondansetron (ZOFRAN) tablet 4 mg  4 mg Oral Q6H PRN Alexis Hugelmeyer, DO       Or  . ondansetron (ZOFRAN) injection 4 mg  4 mg Intravenous Q6H PRN Alexis Hugelmeyer, DO      . oxyCODONE (Oxy IR/ROXICODONE) immediate release tablet 5 mg  5 mg Oral Q4H PRN Vaughan Basta, MD   5 mg at 07/26/16 0257  .  pantoprazole (PROTONIX) EC tablet 40 mg  40 mg Oral QAC breakfast Alexis Hugelmeyer, DO   40 mg at 07/28/16 0807  . polyvinyl alcohol (LIQUIFILM TEARS) 1.4 % ophthalmic solution 1-2 drop  1-2 drop Both Eyes Daily Alexis Hugelmeyer, DO   2 drop at 07/28/16 1000  . senna-docusate (Senokot-S) tablet 1 tablet  1 tablet Oral QHS PRN Alexis Hugelmeyer, DO      . tiotropium (SPIRIVA) inhalation capsule 18 mcg  18 mcg Inhalation Daily [employer],  DO   18 mcg at 07/28/16 0816  . traMADol (ULTRAM) tablet 50 mg  50 mg Oral TID PRN Vaughan Basta, MD   50 mg at 07/27/16 2001  . vitamin B-12 (CYANOCOBALAMIN) tablet 2,500 mcg  2,500 mcg Oral BH-q7a Alexis Hugelmeyer, DO   2,500 mcg at 07/28/16 0806  . vitamin E capsule 400 Units  400 Units Oral BH-q7a Alexis Hugelmeyer, DO   400 Units at 07/28/16 [id number]     Discharge Medications: Please see discharge summary for a list of discharge medications.  Relevant Imaging Results:  Relevant Lab Results:   Additional Information SSN [id number]  [name], [place]

## 2016-07-28 NOTE — Clinical Social Work Note (Signed)
Clinical Social Work Assessment  Patient Details  Name: Andrew [name] MRN: [id number] Date of Birth: [birth year]  Date of referral:  07/27/16               Reason for consult:  Facility Placement                Permission sought to share information with:  Facility Sport and exercise psychologist, Family Supports Permission granted to share information::  Yes, Verbal Permission Granted  Name::     Andrew [name], Was [phone]  [phone]   Agency::  SNF admissions  Relationship::     Contact Information:     Housing/Transportation Living arrangements for the past 2 months:  [place] of Information:  Medical Team Patient Interpreter Needed:  None Criminal Activity/Legal Involvement Pertinent to Current Situation/Hospitalization:  No - Comment as needed Significant Relationships:  Adult Children Lives with:  Self Do you feel safe going back to the place where you live?  No Need for family participation in patient care:  Yes (Comment)  Care giving concerns: Patient is in agreement to going to SNF for short term rehab.  Social Worker assessment / plan: Patient is a 83 year old male who lives alone, but has been at [facility] receiving therapy.  Patient was a little confused, but states he is in agreement to returning back to [facility].  CSW explained what the role is of CSW in regards to trying to facilitate discharge back to SNF.  Patient did not have any questions or concerns.  Employment status:  Retired Nurse, adult PT Recommendations:  [place] / Referral to community resources:  [place]  Patient/Family's Response to care:  Patient in agreement to going back to [facility] Patient/Family's Understanding of and Emotional Response to Diagnosis, Current Treatment, and Prognosis:  Patient not aware of current treatment plan or prognosis.  Emotional Assessment Appearance:   Appears stated age Attitude/Demeanor/Rapport:    Affect (typically observed):  Appropriate Orientation:  Oriented to Self Alcohol / Substance use:  Not Applicable Psych involvement (Current and /or in the community):  No (Comment)  Discharge Needs  Concerns to be addressed:  Lack of Support Readmission within the last 30 days:  No Current discharge risk:  Lack of support system Barriers to Discharge:  Continued Medical Work up   Andrew [name] 07/28/2016, 6:08 PM

## 2016-07-28 NOTE — Progress Notes (Signed)
[place] NOTE  Patient Care Team: Adin Hector, MD as PCP - General (Internal Medicine) Christin Fudge, MD as Consulting Physician (Surgery) Wellington Hampshire, MD as Consulting Physician (Cardiology)  CHIEF COMPLAINTS/PURPOSE OF CONSULTATION: Thombocytosis  CC: Patient continues to do poorly. Palliative care team has evaluated the patient/ the patient is hospice appropriate. Family is still hoping for a turnaround.  As per the daughter pt drinking coffee/grape juice. However no significant improvement noted per family.  MEDICAL HISTORY:  Past Medical History:  Diagnosis Date  . Asthma   . Chronic kidney disease   . Hypertension   . Peripheral vascular disease ([place])     SURGICAL HISTORY: Past Surgical History:  Procedure Laterality Date  . PERIPHERAL VASCULAR CATHETERIZATION Right 02/07/2016   Procedure: Lower Extremity Angiography;  Surgeon: Algernon Huxley, MD;  Location: [place] [facility];  Service: Cardiovascular;  Laterality: Right;  . PERIPHERAL VASCULAR CATHETERIZATION Right 03/27/2016   Procedure: Lower Extremity Angiography;  Surgeon: Algernon Huxley, MD;  Location: [place] [facility];  Service: Cardiovascular;  Laterality: Right;  . PERIPHERAL VASCULAR CATHETERIZATION  03/27/2016   Procedure: Lower Extremity Intervention;  Surgeon: Algernon Huxley, MD;  Location: [place] [facility];  Service: Cardiovascular;;  . PERIPHERAL VASCULAR CATHETERIZATION Right 03/28/2016   Procedure: Lower Extremity Angiography;  Surgeon: Algernon Huxley, MD;  Location: [place] [facility];  Service: Cardiovascular;  Laterality: Right;  . PERIPHERAL VASCULAR CATHETERIZATION Right 06/01/2016   Procedure: Lower Extremity Angiography;  Surgeon: Algernon Huxley, MD;  Location: [place] [facility];  Service: Cardiovascular;  Laterality: Right;  . PERIPHERAL VASCULAR CATHETERIZATION  06/01/2016   Procedure: Lower Extremity Intervention;  Surgeon: Algernon Huxley, MD;  Location: [place] [facility]  LAB;  Service: Cardiovascular;;  . SHOULDER SURGERY  1974  . UPPER GI ENDOSCOPY      SOCIAL HISTORY: Social History   Social History  . Marital status: Unknown    Spouse name: N/A  . Number of children: N/A  . Years of education: N/A   Occupational History  . Not on file.   Social History Main Topics  . Smoking status: Former Research scientist (life sciences)  . Smokeless tobacco: Former Systems developer    Quit date: 05/16/1993  . Alcohol use No  . Drug use: No  . Sexual activity: Not on file   Other Topics Concern  . Not on file   Social History Narrative  . No narrative on file    FAMILY HISTORY: Family History  Problem Relation Age of Onset  . Heart failure Mother   . Stroke Father     ALLERGIES:  is allergic to keflex [cephalexin].  MEDICATIONS:  Current Facility-Administered Medications  Medication Dose Route Frequency Provider Last Rate Last Dose  . acetaminophen (TYLENOL) tablet 650 mg  650 mg Oral Q6H PRN Alexis Hugelmeyer, DO       Or  . acetaminophen (TYLENOL) suppository 650 mg  650 mg Rectal Q6H PRN Alexis Hugelmeyer, DO      . albuterol (PROVENTIL) (2.5 MG/3ML) 0.083% nebulizer solution 2.5 mg  2.5 mg Nebulization Q6H PRN Alexis Hugelmeyer, DO      . albuterol (PROVENTIL) (2.5 MG/3ML) 0.083% nebulizer solution 2.5 mg  2.5 mg Nebulization BID Alexis Hugelmeyer, DO   2.5 mg at 07/28/16 0742  . Ampicillin-Sulbactam (UNASYN) 3 g in sodium chloride 0.9 % 100 mL IVPB  3 g Intravenous Q12H Sheema M Hallaji, RPH   3 g at 07/27/16 2001  .  apixaban (ELIQUIS) tablet 2.5 mg  2.5 mg Oral BID Alexis Hugelmeyer, DO   2.5 mg at 07/27/16 2001  . aspirin EC tablet 81 mg  81 mg Oral Daily Alexis Hugelmeyer, DO   81 mg at 07/27/16 0941  . atorvastatin (LIPITOR) tablet 20 mg  20 mg Oral QPM Alexis Hugelmeyer, DO   20 mg at 07/27/16 1734  . bisacodyl (DULCOLAX) EC tablet 5 mg  5 mg Oral Daily PRN Alexis Hugelmeyer, DO      . bisacodyl (DULCOLAX) suppository 10 mg  10 mg Rectal Once Vaughan Basta, MD       . cholecalciferol (VITAMIN D) tablet 1,000 Units  1,000 Units Oral BH-q7a Alexis Hugelmeyer, DO   1,000 Units at 07/28/16 0806  . clopidogrel (PLAVIX) tablet 75 mg  75 mg Oral Daily Alexis Hugelmeyer, DO   75 mg at 07/27/16 0940  . feeding supplement (ENSURE ENLIVE) (ENSURE ENLIVE) liquid 237 mL  237 mL Oral TID WC Vaughan Basta, MD   237 mL at 07/27/16 1736  . fluticasone (FLONASE) 50 MCG/ACT nasal spray 2 spray  2 spray Each Nare Daily Alexis Hugelmeyer, DO   2 spray at 07/27/16 0955  . hydroxyurea (HYDREA) capsule 500 mg  500 mg Oral Daily Cammie Sickle, MD   500 mg at 07/27/16 0939  . levothyroxine (SYNTHROID, LEVOTHROID) tablet 75 mcg  75 mcg Oral QAC breakfast Alexis Hugelmeyer, DO   75 mcg at 07/28/16 0807  . loratadine (CLARITIN) tablet 10 mg  10 mg Oral Daily Alexis Hugelmeyer, DO   10 mg at 07/27/16 0940  . LORazepam (ATIVAN) injection 0.5 mg  0.5 mg Intravenous Q4H PRN Basilio Cairo, NP      . magnesium citrate solution 1 Bottle  1 Bottle Oral Once PRN Alexis Hugelmeyer, DO      . mometasone-formoterol (DULERA) 200-5 MCG/ACT inhaler 2 puff  2 puff Inhalation BID Alexis Hugelmeyer, DO   2 puff at 07/28/16 0815  . morphine CONCENTRATE 10 MG/0.5ML oral solution 5 mg  5 mg Sublingual Q2H PRN Basilio Cairo, NP      . multivitamin-lutein (OCUVITE-LUTEIN) capsule 1 capsule  1 capsule Oral BID Alexis Hugelmeyer, DO   1 capsule at 07/27/16 2001  . mupirocin ointment (BACTROBAN) 2 % 1 application  1 application Nasal BID Vaughan Basta, MD   1 application at 0000000 2008  . naphazoline-glycerin (CLEAR EYES) ophth solution 1-2 drop  1-2 drop Both Eyes QID PRN Alexis Hugelmeyer, DO      . nystatin (MYCOSTATIN/NYSTOP) topical powder   Topical TID Vaughan Basta, MD      . ondansetron (ZOFRAN) tablet 4 mg  4 mg Oral Q6H PRN Alexis Hugelmeyer, DO       Or  . ondansetron (ZOFRAN) injection 4 mg  4 mg Intravenous Q6H PRN Alexis Hugelmeyer, DO      . oxyCODONE (Oxy  IR/ROXICODONE) immediate release tablet 5 mg  5 mg Oral Q4H PRN Vaughan Basta, MD   5 mg at 07/26/16 0257  . pantoprazole (PROTONIX) EC tablet 40 mg  40 mg Oral QAC breakfast Alexis Hugelmeyer, DO   40 mg at 07/28/16 0807  . polyvinyl alcohol (LIQUIFILM TEARS) 1.4 % ophthalmic solution 1-2 drop  1-2 drop Both Eyes Daily Alexis Hugelmeyer, DO   1 drop at 07/27/16 0956  . senna-docusate (Senokot-S) tablet 1 tablet  1 tablet Oral QHS PRN Alexis Hugelmeyer, DO      . tiotropium (SPIRIVA) inhalation capsule 18 mcg  18 mcg  Inhalation Daily Alexis Hugelmeyer, DO   18 mcg at 07/28/16 0816  . traMADol (ULTRAM) tablet 50 mg  50 mg Oral TID PRN Vaughan Basta, MD   50 mg at 07/27/16 2001  . vitamin B-12 (CYANOCOBALAMIN) tablet 2,500 mcg  2,500 mcg Oral BH-q7a Alexis Hugelmeyer, DO   2,500 mcg at 07/28/16 0806  . vitamin E capsule 400 Units  400 Units Oral BH-q7a Alexis Hugelmeyer, DO   400 Units at 07/28/16 [id number]      .  PHYSICAL EXAMINATION:  Vitals:   07/28/16 0415 07/28/16 1347  BP: 133/62 (!) 112/98  Pulse: (!) 56 (!) 107  Resp: 19 20  Temp: 98.1 F (36.7 C) 98.1 F (36.7 C)   Filed Weights   07/23/16 1725 07/24/16 0356  Weight: 161 lb 11.2 oz (73.3 kg) 160 lb 14.4 oz (73 kg)    GENERAL: Frail-appearing cachectic Caucasian male patient. Daughter No acute distress. EYES: no pallor or icterus OROPHARYNX:  Dry.  NECK: supple, no masses felt LYMPH:  no palpable lymphadenopathy in the cervical, axillary or inguinal regions LUNGS: decreased breath sounds to auscultation at bases and  No wheeze or crackles HEART/CVS: regular rate & rhythm and no murmurs; No lower extremity edema. Bilateral lower extremities breast discoloration; chronic ulcerations noted. ABDOMEN: abdomen soft, non-tender and normal bowel sounds PSYCH: Drowsy.  NEURO: Cannot be examined. LABORATORY DATA:  I have reviewed the data as listed Lab Results  Component Value Date   WBC 17.0 (H) 07/26/2016   HGB  10.4 (L) 07/26/2016   HCT 30.4 (L) 07/26/2016   MCV 92.7 07/26/2016   PLT 1,120 (HH) 07/26/2016    Recent Labs  07/23/16 1731  07/25/16 0353 07/26/16 0410 07/27/16 0802  NA 132*  < > 143 146* 148*  K 3.7  < > 3.0* 3.2* 4.3  CL 86*  < > 103 112* 117*  CO2 24  < > 23 24 25   GLUCOSE 144*  < > 88 107* 169*  BUN 146*  < > 116* 97* 74*  CREATININE 7.82*  < > 5.11* 3.32* 2.24*  CALCIUM 8.3*  < > 8.1* 8.3* 8.3*  GFRNONAA 5*  < > 9* 15* 24*  GFRAA 6*  < > 10* 17* 28*  PROT 7.2  --   --   --   --   ALBUMIN 3.3*  --   --   --   --   AST 71*  --   --   --   --   ALT 29  --   --   --   --   ALKPHOS 82  --   --   --   --   BILITOT 0.4  --   --   --   --   < > = values in this interval not displayed.  RADIOGRAPHIC STUDIES: I have personally reviewed the radiological images as listed and agreed with the findings in the report. US Renal  Result Date: 07/23/2016 CLINICAL DATA:  83 y/o  M; acute renal failure. EXAM: RENAL / URINARY TRACT ULTRASOUND COMPLETE COMPARISON:  04/21/2004 CT of abdomen and pelvis. FINDINGS: Right Kidney: Length: 9.9 cm. Mildly increased echogenicity when compared with the liver parenchyma. Upper pole simple appearing cyst measuring up to 1.4 cm. Left Kidney: Length: 9.9 cm. Simple appearing cysts within the mid kidney measuring 2.9 in 2.3 cm. Mildly increased echogenicity of the renal parenchyma when compared to the spleen. Bladder: Appears normal for degree of bladder distention. IMPRESSION: Mild  increase in kidney echogenicity bilaterally is compatible with medical renal disease. No hydronephrosis. Electronically Signed   By: Kristine Garbe M.D.   On: 07/23/2016 22:18   [place] Lower Ext Art Bilat  Result Date: 07/25/2016 CLINICAL DATA:  Peripheral arterial disease and right lower extremity ulceration. EXAM: BILATERAL LOWER EXTREMITY ARTERIAL DUPLEX SCAN TECHNIQUE: Gray-scale sonography as well as color Doppler and duplex ultrasound was performed to evaluate the  arteries of both lower extremities. COMPARISON:  None. FINDINGS: Right lower extremity: ABI not obtained. The right common femoral artery is patent with monophasic waveform. The proximal right profunda femoral artery has an elevated peak systolic velocity measuring up to 255 cm/sec and suggestive for a stenosis. The right superficial femoral artery is occluded at the origin. Evidence for an occluded stent in the mid right SFA. Distal right SFA is occluded. Right popliteal artery is occluded. There appears to be minimal flow in the right anterior tibial artery. There is also some flow in the right posterior tibial artery. Left lower extremity: ABI not obtained. Left common femoral artery is patent with a monophasic waveform. Left profunda femoral artery is patent with monophasic waveform. Left SFA has some atherosclerotic disease but it is patent with a monophasic waveform. Echogenic plaque in the left popliteal artery. Left popliteal artery is patent with a monophasic waveform. Monophasic waveform in the left posterior tibial artery. Monophasic waveform in the anterior tibial artery. IMPRESSION: Occlusion of the right SFA and right popliteal artery. Left lower extremity arteries are patent. Electronically Signed   By: Markus Daft M.D.   On: 07/25/2016 20:25   Dg Chest Portable 1 View  Result Date: 07/23/2016 CLINICAL DATA:  Weakness.  Acute renal injury. EXAM: PORTABLE CHEST 1 VIEW COMPARISON:  03/27/2016 FINDINGS: Heart size remains stable. Mild bilateral scarring again noted. No evidence of pulmonary infiltrate or edema. No evidence of pneumothorax or pleural effusion. IMPRESSION: No active disease. Electronically Signed   By: Earle Gell M.D.   On: 07/23/2016 18:36    ASSESSMENT & PLAN:   # 83 year old male patient with multiple medical problems and also peripheral vascular disease currently admitted to the hospital for bacteremia sepsis and acute renal failure; and also thrombocytosis.  # Extreme  thrombocytosis/ # Peripheral vascular disease- currently on aspirin and Plavix and Eliquis. - platelets of 1 million. 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 Workup is pending. Currently on Hydrea.  # Multiple co- morbidities- acute renal failure; sepsis; multiple medical issues agree with hospice.  # Discussed with patient's daughter Zigmund Daniel by the bedside. She agrees. Currently awaiting the clinical course of the next few days/ transition to hospice early next week.    Cammie Sickle, MD 07/28/2016 5:46 PM

## 2016-07-28 NOTE — Progress Notes (Signed)
[facility] [place] NAME: Andrew [name]    MR#:  [id number]  DATE OF BIRTH:  1923-02-11  SUBJECTIVE:  CHIEF COMPLAINT:   Chief Complaint  Patient presents with  . Abnormal Lab   came with confusion, he have chronic vascular issues on his leg- have confusion. Found to have cellulitis and bacteremia. Also have acute renal failure.   Pt remains confused today.  Found to have occlusion in his arteries on the right lower extremity. Today relating care and vascular team had discussion with the patient's neighbor and family and finally came to conclusion off keeping him as DO NOT RESUSCITATE and they will continue to talk for further decision about his care from now onward.   After having a meeting with the his neighbor and son, palliative care came to conclusion about stopping IV fluids and switch ABx to oral. Then if he does not improve much- Monday or Tuesday- transfer to hospice.  REVIEW OF SYSTEMS:  Because of confusion, not giving much details. ROS  DRUG ALLERGIES:   Allergies  Allergen Reactions  . Keflex [Cephalexin] Other (See Comments)    Patient states this medication made his legs hurt really bad.    VITALS:  Blood pressure 124/67, pulse (!) 112, temperature 99.4 F (37.4 C), temperature source Oral, resp. rate (!) 21, height 5\' 9"  (1.753 m), weight 73 kg (160 lb 14.4 oz), SpO2 98 %.  PHYSICAL EXAMINATION:  GENERAL:  83 y.o.-year-old patient lying in the bed with no acute distress.  EYES: Pupils equal, round, reactive to light and accommodation. No scleral icterus. Extraocular muscles intact.  HEENT: Head atraumatic, normocephalic. Oropharynx and nasopharynx clear. Dry mucosa. NECK:  Supple, no jugular venous distention. No thyroid enlargement, no tenderness.  LUNGS: Normal breath sounds bilaterally, no wheezing, rales,rhonchi or crepitation. No use of accessory muscles of respiration.  CARDIOVASCULAR: S1, S2 normal. No murmurs,  rubs, or gallops.  ABDOMEN: Soft, nontender, nondistended. Bowel sounds present. No organomegaly or mass.  EXTREMITIES: right leg- redness, non palpable pulses, superficial ulceration , cyanosis, or clubbing. Both legs red. NEUROLOGIC: pt is alert, speaks few words in confusion, but not following commands. PSYCHIATRIC: The patient is alert and some confused.  SKIN: as mentioned above.   Physical Exam LABORATORY PANEL:   CBC  Recent Labs Lab 07/26/16 0410  WBC 17.0*  HGB 10.4*  HCT 30.4*  PLT 1,120*   ------------------------------------------------------------------------------------------------------------------  Chemistries   Recent Labs Lab 07/23/16 1731  07/25/16 1724  07/27/16 0802  NA 132*  < >  --   < > 148*  K 3.7  < >  --   < > 4.3  CL 86*  < >  --   < > 117*  CO2 24  < >  --   < > 25  GLUCOSE 144*  < >  --   < > 169*  BUN 146*  < >  --   < > 74*  CREATININE 7.82*  < >  --   < > 2.24*  CALCIUM 8.3*  < >  --   < > 8.3*  MG  --   --  2.3  --   --   AST 71*  --   --   --   --   ALT 29  --   --   --   --   ALKPHOS 82  --   --   --   --   BILITOT 0.4  --   --   --   --   < > =  values in this interval not displayed. ------------------------------------------------------------------------------------------------------------------  Cardiac Enzymes  Recent Labs Lab 07/23/16 1731 07/25/16 0353  TROPONINI 0.08* 0.28*   ------------------------------------------------------------------------------------------------------------------  RADIOLOGY:  No results found.  ASSESSMENT AND PLAN:   Active Problems:   Sepsis ([place])   Pressure injury of skin   Acute renal failure (HCC)   Lower extremity ulceration, right, with unspecified severity ([place])   Palliative care by specialist   Goals of care, counseling/discussion   DNR (do not resuscitate)   PAD (peripheral artery disease) ([place])   Terminal care  1. Sepsis, possibly secondary to cellulitis, lower  extremity wounds, bacteremia with enterococcus  -IV Zosyn and Vanco- switched to unasyn per ID. -Follow-up blood, urine, wound culture -IV fluid hydration -Wound consult, vascular consult and ID consult. -Pain control - get TTE as per ID. No blood clots/ vegetations. - pt have arterial occlusions on right LE- called vascular for follow up. - After meeting with palliative care, Family and neighbours agreed to stop IV fluids and give oral Abx, and if he deteriorates over weekend- start comfort care after talking to family. Other wise on Monday or Tuesday- transfer to hospice home. Patient has very poor oral intake and with multiple infected ulcers and vascular issues he has poor prognosis. 2. AKI -IV fluid hydration and repeat BMP in a.m. -Hold nephrotoxic medications (Cozaar, HCTZ) -Renal ultrasound showed medical renal disease -Monitor Urine Output , Foley catheter evidence of urinary retention - nephrology consult appreciated. - slight improvement. 3. Thrombocytosis, acute on chronic possibly reactive -Repeat CBC in a.m.  -call hematology.   Started on hydroxyurea. 4. Elevated troponin -Likely secondary to demand ischemia  follow on telemetry , troponin slightly high- may be due to infection and renal failure.  monitor. 5. PAD -appreciated vascular consult- suggest conservative management for now. -Continue Eliquis and Plavix   As per doppler have occlusion in arteries, called vascular. - as per Dr. Lucky Cowboy- vascular reconstruction is not an option and suggested to have hospice.  6. History of hyperlipidemia -Continue Lipitor 7. History of asthma and allergies -Continue Allegra, fluticasone, Advair, Spiriva 8. History of GERD-continue Prilosec 9. History of hypertension -Hold Cozaar and HCTZ  10. Hypokalemia   Replace oral, this may be the reason for PVCs   Mg is normal.    All the records are reviewed and case discussed with Care Management/Social Workerr. Management plans  discussed with the patient, family and they are in agreement.  CODE STATUS: DNR  TOTAL TIME TAKING CARE OF THIS PATIENT: 35 minutes.   Palliative care consult to decide goals of care. Plan as above.  POSSIBLE D/C IN 2-3 DAYS, DEPENDING ON CLINICAL CONDITION.   Vaughan Basta M.D on 07/28/2016   Between 7am to 6pm - Pager - [phone]  After 6pm go to [email or web address] - password [email or web address] [facility]  Office  [phone]  CC: Primary care physician; Adin Hector, MD  Note: This dictation was prepared with Dragon dictation along with smaller phrase technology. Any transcriptional errors that result from this process are unintentional.

## 2016-07-28 NOTE — Progress Notes (Addendum)
Daily Progress Note   Patient Name: KRISTIN ROSSITER       Date: 07/28/2016 DOB: 31-Oct-1922  Age: 83 y.o. MRN#: [id number] Attending Physician: Vaughan Basta, MD Primary Care Physician: Adin Hector, MD Admit Date: 07/23/2016  Reason for Consultation/Follow-up: Establishing goals of care  Subjective: Mr. Cieslinski with increased drowsiness/lethargy. Will occasionally wake to voice but falls back to sleep. Neighbor, Kay at bedside and attempting to feed patient. He is pocketing fruit. He will drink milk and coffee for her. She is happy he was able to work with physical therapy yesterday. Zigmund Daniel is often sidetracked during the conversation because she is focused on what to order Mr. Rauf for lunch and dinner. I encouraged her to get him to eat and drink what he can but not to force if he is not awake and alert to swallow. She states "he will wake up after his bath."   Again discussed diagnoses, poor prognosis, and that IVF/antibiotics may be prolonging the dying process. Zigmund Daniel becomes very tearful during the process but eventually tells me she feels he needs to go to a hospice home. I educated that this would mean a transition to comfort measures only and the fluids and antibiotics will be discontinued. Focus would be comfort, quality, dignity for each day he has left. Also symptom management as needed to relieve suffering.   Dr. Anselm Jungling came to bedside and we determined the plan moving forward will be to discontinue IV fluids and IV antibiotics and see how the patient does over the weekend. If he declines, Zigmund Daniel agrees with comfort measures only and to allow a natural death. If stable, transfer to hospice home Monday or Tuesday.   Discussed expectations at EOL and hospice services. Answered  questions and provided support. I updated Harold via telephone and he also agrees with this plan moving forward.  Length of Stay: 4  Current Medications: Scheduled Meds:  . albuterol  2.5 mg Nebulization BID  . ampicillin-sulbactam (UNASYN) IV  3 g Intravenous Q12H  . apixaban  2.5 mg Oral BID  . aspirin EC  81 mg Oral Daily  . atorvastatin  20 mg Oral QPM  . bisacodyl  10 mg Rectal Once  . cholecalciferol  1,000 Units Oral BH-q7a  . clopidogrel  75 mg Oral Daily  . feeding supplement (  ENSURE ENLIVE)  237 mL Oral TID WC  . fluticasone  2 spray Each Nare Daily  . hydroxyurea  500 mg Oral Daily  . levothyroxine  75 mcg Oral QAC breakfast  . loratadine  10 mg Oral Daily  . mometasone-formoterol  2 puff Inhalation BID  . multivitamin-lutein  1 capsule Oral BID  . mupirocin ointment  1 application Nasal BID  . nystatin   Topical TID  . pantoprazole  40 mg Oral QAC breakfast  . polyvinyl alcohol  1-2 drop Both Eyes Daily  . tiotropium  18 mcg Inhalation Daily  . vitamin B-12  2,500 mcg Oral BH-q7a  . vitamin E  400 Units Oral BH-q7a    Continuous Infusions:  PRN Meds: acetaminophen **OR** acetaminophen, albuterol, bisacodyl, magnesium citrate, naphazoline-glycerin, ondansetron **OR** ondansetron (ZOFRAN) IV, oxyCODONE, senna-docusate, traMADol  Physical Exam  Constitutional: He is easily aroused. He appears ill.  HENT:  Head: Normocephalic and atraumatic.  Cardiovascular: Regular rhythm and normal heart sounds.   Pulmonary/Chest: No accessory muscle usage. No tachypnea. He has decreased breath sounds.  Abdominal: Normal appearance.  Neurological: He is alert and easily aroused. He is disoriented.  Skin: Skin is warm and dry. There is pallor.  Psychiatric: His speech is delayed. Cognition and memory are impaired. He is inattentive.  Nursing note and vitals reviewed.          Vital Signs: BP (!) 112/98 (BP Location: Left Arm)   Pulse (!) 107   Temp 98.1 F (36.7 C) (Oral)    Resp 20   Ht 5\' 9"  (1.753 m)   Wt 73 kg (160 lb 14.4 oz)   SpO2 98%   BMI 23.76 kg/m  SpO2: SpO2: 98 % O2 Device: O2 Device: Nasal Cannula O2 Flow Rate: O2 Flow Rate (L/min): 2 L/min  Intake/output summary:   Intake/Output Summary (Last 24 hours) at 07/28/16 1631 Last data filed at 07/28/16 0330  Gross per 24 hour  Intake           2027.5 ml  Output                0 ml  Net           2027.5 ml   LBM: Last BM Date: 07/23/16 Baseline Weight: Weight: 73.3 kg (161 lb 11.2 oz) Most recent weight: Weight: 73 kg (160 lb 14.4 oz)  Palliative Assessment/Data: PPS 30%   Flowsheet Rows   Flowsheet Row Most Recent Value  Intake Tab  Referral Department  Hospitalist  Unit at Time of Referral  Oncology Unit  Palliative Care Primary Diagnosis  Sepsis/Infectious Disease  Date Notified  07/25/16  Palliative Care Type  New Palliative care  Reason for referral  Clarify Goals of Care  Date of Admission  07/23/16  Date first seen by Palliative Care  07/26/16  # of days Palliative referral response time  1 Day(s)  # of days IP prior to Palliative referral  2  Clinical Assessment  Palliative Performance Scale Score  30%  Psychosocial & Spiritual Assessment  Palliative Care Outcomes  Patient/Family meeting held?  Yes  Who was at the meeting?  patient, neighbor Zigmund Daniel), and son [name])  [place] goals of care, Counseled regarding hospice, Provided psychosocial or spiritual support, Provided end of life care assistance, ACP counseling assistance      Patient Active Problem List   Diagnosis Date Noted  . Acute renal failure ([place])   . Lower extremity ulceration, right, with unspecified  severity ([place])   . Palliative care by specialist   . Goals of care, counseling/discussion   . DNR (do not resuscitate)   . PAD (peripheral artery disease) ([place])   . Sepsis ([place]) 07/24/2016  . Pressure injury of skin 07/24/2016  . Peripheral vascular disease ([place]) 06/01/2016  .  Lower limb ulcer, ankle, right, limited to breakdown of skin ([place]) 04/14/2016  . Hyperlipidemia 04/14/2016  . Essential hypertension 04/14/2016  . Atherosclerosis of native artery of right lower extremity with ulceration of ankle ([place]) 04/14/2016  . Swelling of limb 04/14/2016  . Cellulitis 03/27/2016  . Cold foot with peripheral vascular disease ([place]) 03/27/2016  . Chronic venous hypertension with ulcer ([place]) 01/13/2016    Palliative Care Assessment & Plan   Patient Profile: 83 y.o. male  with past medical history of CKD, HTN, PVD/PAD with multiple revascularizations, and asthma admitted on 07/23/2016 with acute renal failure from [facility]. He has bilateral nonhealing ulcerations of lower extremities, R>L. This admit, found to have cellulitis, bacteremia, and acute renal failure. He is a known patient of vascular surgery due to multiple previous revascularizations to right lower extremity. Patient has been told he would need an amputation if pain was intractable or infection was not controlled by wound care and antibiotics. 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 Conservative management for now. Followed by nephrology for renal failure and also known patient to the practice-receiving IVF and supportive care. Followed by infectious disease for bacteremia and receiving unasyn. Palliative medicine consultation for goals of care.  Assessment: Sepsis Bacteremia Acute renal failure  Chronic kidney disease Lower extremity ulceration Peripheral artery disease Thrombocytosis   Recommendations/Plan:  DNR/DNI  Dr. Anselm Jungling and I discussed plan with family at bedside. IVF will be discontinued and antibiotics transitioned to oral.   If patient should decline, focus will be comfort and no escalation of care. Transfer to hospice home Monday/tuesday if stable. Family agrees.   I have ordered prn Roxanol for dyspnea/air hunger and Ativan prn for anxiety/agitation.   PMT  not at [facility] over the weekend but will follow up Monday, 07/31/16 if patient survives the weekend.   Code Status: DNR   Code Status Orders        Start     Ordered   07/26/16 1511  Do not attempt resuscitation (DNR)  Continuous    Question Answer Comment  In the event of cardiac or respiratory ARREST Do not call a "code blue"   In the event of cardiac or respiratory ARREST Do not perform Intubation, CPR, defibrillation or ACLS   In the event of cardiac or respiratory ARREST Use medication by any route, position, wound care, and other measures to relive pain and suffering. May use oxygen, suction and manual treatment of airway obstruction as needed for comfort.      07/26/16 1510    Code Status History    Date Active Date Inactive Code Status Order ID Comments User Context   07/24/2016  3:43 AM 07/26/2016  3:10 PM Full Code [id number]  [facility], DO Inpatient   06/04/2016  7:53 AM 06/06/2016  9:17 PM Full Code [id number]  Terrilyn Saver, RN Inpatient   03/27/2016  5:39 PM 03/31/2016  7:32 PM Full Code [id number]  Vaughan Basta, MD Inpatient   02/07/2016 12:31 PM 02/07/2016  6:41 PM Full Code [id number]  Algernon Huxley, MD Inpatient       Prognosis:   < 2 weeks likely days if he  declines after IVF discontinued. Drowsiness/lethargy with poor oral intake.   Discharge Planning:  To Be Determined If patient should decline over the weekend once fluids have been discontinued, will need hospice facility or have hospital death.   Care plan was discussed with patient, family, RN, RN CM, and Dr. Anselm Jungling  Thank you for allowing the Palliative Medicine Team to assist in the care of this patient.   Time In: 0915 Time Out: 1000 Total Time 29min Prolonged Time Billed  no      Greater than 50%  of this time was spent counseling and coordinating care related to the above assessment and plan.  Ihor Dow, FNP-C Palliative Medicine Team  Phone: [phone] Fax: [phone]  Please  contact Palliative Medicine Team phone at [phone] for questions and concerns.

## 2016-07-28 NOTE — Progress Notes (Signed)
Pharmacy Antibiotic Note  Andrew [name] is a 83 y.o. male admitted on 07/23/2016 with sepsis.  Pharmacy has been consulted for Zosyn and vancomycin dosing.   BCID now showing Enterococcus species [name] A/B not detected). After discussion with Dr. Anselm Jungling will discontinue Vanc and Zosyn and begin treatment with Unasyn  1/21 BCx: 2 bottles ENTEROCOCCUS FAECALIS (A)  Plan: Will continue Unasyn 3 gm IV every 12  Hours based on current CrCl between 15-29 ml/min.    Height: 5\' 9"  (175.3 cm) Weight: 160 lb 14.4 oz (73 kg) IBW/kg (Calculated) : 70.7  Temp (24hrs), Avg:98.3 F (36.8 C), Min:98.1 F (36.7 C), Max:98.7 F (37.1 C)   Recent Labs Lab 07/23/16 1731 07/23/16 2023 07/24/16 0446 07/24/16 1038 07/24/16 1953 07/25/16 0353 07/26/16 0410 07/27/16 0802  WBC 23.9*  --  19.8*  --   --  16.6* 17.0*  --   CREATININE 7.82*  --  6.58*  --   --  5.11* 3.32* 2.24*  LATICACIDVEN 3.0* 2.3*  --   --  1.3  --   --   --   VANCORANDOM  --   --   --  13  --   --   --   --     Estimated Creatinine Clearance: 21.5 mL/min (by C-G formula based on SCr of 2.24 mg/dL (H)).    Allergies  Allergen Reactions  . Keflex [Cephalexin] Other (See Comments)    Patient states this medication made his legs hurt really bad.   Antimicrobials this admission:  1/21 vancomycin  >> 1/22 1/21 Zosyn>> 1/22 1/22: Unasyn >>   Microbiology results:  1/21 BCx: x2 GPC- Enterococcus faecalis (sensitive to ampicillin) 1/22 BCID:  enterococcus (VAN A/B neg)  1/22 BCx x1 NG x4 days 1/22 MRSA PCR: positive  1/24 BCx: x2 NGTD x 2 days   Thank you for allowing pharmacy to be a part of this patient's care.  Rocky Morel, Pharm.D., BCPS Clinical Pharmacist 07/28/2016 12:03 PM

## 2016-07-29 LAB — CULTURE, BLOOD (SINGLE): Culture: NO GROWTH

## 2016-07-29 LAB — BASIC METABOLIC PANEL
ANION GAP: 3 — AB (ref 5–15)
BUN: 47 mg/dL — ABNORMAL HIGH (ref 6–20)
CHLORIDE: 114 mmol/L — AB (ref 101–111)
CO2: 26 mmol/L (ref 22–32)
CREATININE: 1.81 mg/dL — AB (ref 0.61–1.24)
Calcium: 8.3 mg/dL — ABNORMAL LOW (ref 8.9–10.3)
GFR calc non Af Amer: 31 mL/min — ABNORMAL LOW (ref >60)
GFR, EST AFRICAN AMERICAN: 36 mL/min — AB (ref >60)
Glucose, Bld: 128 mg/dL — ABNORMAL HIGH (ref 65–99)
Potassium: 5.2 mmol/L — ABNORMAL HIGH (ref 3.5–5.1)
SODIUM: 143 mmol/L (ref 135–145)

## 2016-07-29 LAB — CBC
HEMATOCRIT: 30.4 % — AB (ref 40.0–52.0)
HEMOGLOBIN: 9.9 g/dL — AB (ref 13.0–18.0)
MCH: 30.4 pg (ref 26.0–34.0)
MCHC: 32.5 g/dL (ref 32.0–36.0)
MCV: 93.6 fL (ref 80.0–100.0)
Platelets: 798 10*3/uL — ABNORMAL HIGH (ref 150–440)
RBC: 3.24 MIL/uL — AB (ref 4.40–5.90)
RDW: 15.8 % — ABNORMAL HIGH (ref 11.5–14.5)
WBC: 15.5 10*3/uL — AB (ref 3.8–10.6)

## 2016-07-29 LAB — GLUCOSE, CAPILLARY: GLUCOSE-CAPILLARY: 142 mg/dL — AB (ref 65–99)

## 2016-07-29 NOTE — Progress Notes (Signed)
[facility] [place] NAME: Andrew [name]    MR#:  [id number]  DATE OF BIRTH:  1922-09-07  SUBJECTIVE:  CHIEF COMPLAINT:   Chief Complaint  Patient presents with  . Abnormal Lab   came with confusion, he have chronic vascular issues on his leg- have confusion. Found to have cellulitis and bacteremia. Also have acute renal failure.   Pt remains confused today.  Found to have occlusion in his arteries on the right lower extremity. Today relating care and vascular team had discussion with the patient's neighbor and family and finally came to conclusion off keeping him as DO NOT RESUSCITATE and they will continue to talk for further decision about his care from now onward.   After having a meeting with the his neighbor and son, palliative care came to conclusion about stopping IV fluids and switch ABx to oral. Then if he does not improve much- Monday or Tuesday- transfer to hospice.  Confusion.  REVIEW OF SYSTEMS:  Because of confusion, not giving much details. ROS  DRUG ALLERGIES:   Allergies  Allergen Reactions  . Keflex [Cephalexin] Other (See Comments)    Patient states this medication made his legs hurt really bad.    VITALS:  Blood pressure (!) 107/59, pulse (!) 106, temperature 98.5 F (36.9 C), temperature source Oral, resp. rate 20, height 5\' 9"  (1.753 m), weight 160 lb 14.4 oz (73 kg), SpO2 97 %.  PHYSICAL EXAMINATION:  GENERAL:  83 y.o.-year-old patient lying in the bed with no acute distress.  EYES: Pupils equal, round, reactive to light and accommodation. No scleral icterus. Extraocular muscles intact.  HEENT: Head atraumatic, normocephalic. Oropharynx and nasopharynx clear. Dry mucosa. NECK:  Supple, no jugular venous distention. No thyroid enlargement, no tenderness.  LUNGS: Normal breath sounds bilaterally, no wheezing, rales,rhonchi or crepitation. No use of accessory muscles of respiration.  CARDIOVASCULAR: S1, S2 normal.  No murmurs, rubs, or gallops.  ABDOMEN: Soft, nontender, nondistended. Bowel sounds present. No organomegaly or mass.  EXTREMITIES: right leg- redness, non palpable pulses, superficial ulceration , cyanosis, or clubbing. Both legs red. NEUROLOGIC: pt is alert, speaks few words in confusion, but not following commands. PSYCHIATRIC: The patient is alert and some confused.  SKIN: as mentioned above.   Physical Exam LABORATORY PANEL:   CBC  Recent Labs Lab 07/29/16 0601  WBC 15.5*  HGB 9.9*  HCT 30.4*  PLT 798*   ------------------------------------------------------------------------------------------------------------------  Chemistries   Recent Labs Lab 07/23/16 1731  07/25/16 1724  07/29/16 0601  NA 132*  < >  --   < > 143  K 3.7  < >  --   < > 5.2*  CL 86*  < >  --   < > 114*  CO2 24  < >  --   < > 26  GLUCOSE 144*  < >  --   < > 128*  BUN 146*  < >  --   < > 47*  CREATININE 7.82*  < >  --   < > 1.81*  CALCIUM 8.3*  < >  --   < > 8.3*  MG  --   --  2.3  --   --   AST 71*  --   --   --   --   ALT 29  --   --   --   --   ALKPHOS 82  --   --   --   --  BILITOT 0.4  --   --   --   --   < > = values in this interval not displayed. ------------------------------------------------------------------------------------------------------------------  Cardiac Enzymes  Recent Labs Lab 07/23/16 1731 07/25/16 0353  TROPONINI 0.08* 0.28*   ------------------------------------------------------------------------------------------------------------------  RADIOLOGY:  No results found.  ASSESSMENT AND PLAN:   Active Problems:   Sepsis ([place])   Pressure injury of skin   Acute renal failure (HCC)   Lower extremity ulceration, right, with unspecified severity ([place])   Palliative care by specialist   Goals of care, counseling/discussion   DNR (do not resuscitate)   PAD (peripheral artery disease) ([place])   Terminal care  1. Sepsis, possibly secondary to cellulitis, lower  extremity wounds, bacteremia with enterococcus  He was on IV Zosyn and Vanco- switched to unasyn per ID. TTE as per ID: No blood clots/ vegetations. - pt have arterial occlusions on right LE- called vascular for follow up. - After meeting with palliative care, Family and neighbours agreed to stop IV fluids and give oral Abx, and if he deteriorates over weekend- start comfort care after talking to family. Other wise on Monday or Tuesday- transfer to hospice home. Patient has very poor oral intake and with multiple infected ulcers and vascular issues he has poor prognosis.  2. AKI -IV fluid hydration and repeat BMP in a.m. -Hold nephrotoxic medications (Cozaar, HCTZ) -Renal ultrasound showed medical renal disease -Monitor Urine Output , Foley catheter evidence of urinary retention - nephrology consult appreciated. gradually improving.  3. Thrombocytosis, acute on chronic possibly reactive -Repeat CBC in a.m.  -call hematology.   Started on hydroxyurea. 4. Elevated troponin -Likely secondary to demand ischemia  follow on telemetry , troponin slightly high- may be due to infection and renal failure.  5. PAD -appreciated vascular consult- suggest conservative management for now. -Continue Eliquis and Plavix   As per doppler have occlusion in arteries, called vascular. - as per Dr. Lucky Cowboy- vascular reconstruction is not an option and suggested to have hospice.  6. History of hyperlipidemia -Continue Lipitor 7. History of asthma and allergies -Continue Allegra, fluticasone, Advair, Spiriva 8. History of GERD-continue Prilosec 9. History of hypertension -Hold Cozaar and HCTZ  10. Hypokalemia   Replace oral, this may be the reason for PVCs   Mg is normal.  Hyperkalemia. Follow-up BMP.  All the records are reviewed and case discussed with Care Management/Social Workerr. Management plans discussed with the patient, family and they are in agreement.  CODE STATUS: DNR  TOTAL TIME  TAKING CARE OF THIS PATIENT: 36 minutes.   Palliative care consult to decide goals of care. Plan as above.  POSSIBLE D/C IN 2-3 DAYS, DEPENDING ON CLINICAL CONDITION.   Demetrios Loll M.D on 07/29/2016   Between 7am to 6pm - Pager - [phone]  After 6pm go to [email or web address] - password [email or web address] [place] [facility]  Office  [phone]  CC: Primary care physician; Adin Hector, MD  Note: This dictation was prepared with Dragon dictation along with smaller phrase technology. Any transcriptional errors that result from this process are unintentional.

## 2016-07-30 LAB — BASIC METABOLIC PANEL
Anion gap: 7 (ref 5–15)
Anion gap: 4 — ABNORMAL LOW (ref 5–15)
BUN: 44 mg/dL — AB (ref 6–20)
BUN: 39 mg/dL — AB (ref 6–20)
CHLORIDE: 114 mmol/L — AB (ref 101–111)
CHLORIDE: 116 mmol/L — AB (ref 101–111)
CO2: 25 mmol/L (ref 22–32)
CO2: 26 mmol/L (ref 22–32)
Calcium: 8.1 mg/dL — ABNORMAL LOW (ref 8.9–10.3)
Calcium: 8.2 mg/dL — ABNORMAL LOW (ref 8.9–10.3)
Creatinine, Ser: 1.65 mg/dL — ABNORMAL HIGH (ref 0.61–1.24)
Creatinine, Ser: 1.6 mg/dL — ABNORMAL HIGH (ref 0.61–1.24)
GFR calc Af Amer: 40 mL/min — ABNORMAL LOW (ref >60)
GFR calc Af Amer: 42 mL/min — ABNORMAL LOW (ref >60)
GFR calc non Af Amer: 35 mL/min — ABNORMAL LOW (ref >60)
GFR calc non Af Amer: 36 mL/min — ABNORMAL LOW (ref >60)
GLUCOSE: 135 mg/dL — AB (ref 65–99)
GLUCOSE: 130 mg/dL — AB (ref 65–99)
POTASSIUM: 5.5 mmol/L — AB (ref 3.5–5.1)
POTASSIUM: 5 mmol/L (ref 3.5–5.1)
SODIUM: 146 mmol/L — AB (ref 135–145)
Sodium: 146 mmol/L — ABNORMAL HIGH (ref 135–145)

## 2016-07-30 LAB — GLUCOSE, CAPILLARY: GLUCOSE-CAPILLARY: 130 mg/dL — AB (ref 65–99)

## 2016-07-30 MED ORDER — ORAL CARE MOUTH RINSE
15.0000 mL | Freq: Two times a day (BID) | OROMUCOSAL | Status: DC
  Administered 2016-07-31 (×2): 15 mL via OROMUCOSAL

## 2016-07-30 MED ORDER — SODIUM POLYSTYRENE SULFONATE 15 GM/60ML PO SUSP
30.0000 g | Freq: Once | ORAL | Status: AC
  Administered 2016-07-30: 11:00:00 30 g via ORAL
  Filled 2016-07-30: qty 120

## 2016-07-30 MED ORDER — CHLORHEXIDINE GLUCONATE 0.12 % MT SOLN
15.0000 mL | Freq: Two times a day (BID) | OROMUCOSAL | Status: DC
  Administered 2016-07-30 – 2016-07-31 (×3): 15 mL via OROMUCOSAL
  Filled 2016-07-30 (×3): qty 15

## 2016-07-30 NOTE — Progress Notes (Signed)
[facility] NAME: Andrew [name]    MR#:  [id number]  DATE OF BIRTH:  [birth year]  SUBJECTIVE:  CHIEF COMPLAINT:   Chief Complaint  Patient presents with  . Abnormal Lab   came with confusion, he have chronic vascular issues on his leg- have confusion. Found to have cellulitis and bacteremia. Also have acute renal failure.   Pt remains confused today.  Found to have occlusion in his arteries on the right lower extremity. Today relating care and vascular team had discussion with the patient's neighbor and family and finally came to conclusion off keeping him as DO NOT RESUSCITATE and they will continue to talk for further decision about his care from now onward.   After having a meeting with the his neighbor and son, palliative care came to conclusion about stopping IV fluids and switch ABx to oral. Then if he does not improve much- Monday or Tuesday- transfer to hospice.  The patient is confused, on O2 [place] 3L.  REVIEW OF SYSTEMS:  Because of confusion, not giving much details. ROS  DRUG ALLERGIES:   Allergies  Allergen Reactions  . Keflex [Cephalexin] Other (See Comments)    Patient states this medication made his legs hurt really bad.    VITALS:  Blood pressure 113/61, pulse (!) 106, temperature 98.5 F (36.9 C), temperature source Axillary, resp. rate 20, height 5\' 9"  (1.753 m), weight 160 lb 14.4 oz (73 kg), SpO2 100 %.  PHYSICAL EXAMINATION:  GENERAL:  83 y.o.-year-old patient lying in the bed with no acute distress.  EYES: Pupils equal, round, reactive to light and accommodation. No scleral icterus. Extraocular muscles intact.  HEENT: Head atraumatic, normocephalic. Oropharynx and nasopharynx clear. Dry mucosa. NECK:  Supple, no jugular venous distention. No thyroid enlargement, no tenderness.  LUNGS: Normal breath sounds bilaterally, no wheezing, rales,rhonchi or crepitation. No use of accessory muscles of respiration.   CARDIOVASCULAR: S1, S2 normal. No murmurs, rubs, or gallops.  ABDOMEN: Soft, nontender, nondistended. Bowel sounds present. No organomegaly or mass.  EXTREMITIES: right leg- redness, non palpable pulses, superficial ulceration , cyanosis, or clubbing. Both legs red. NEUROLOGIC: pt is alert, speaks few words in confusion, but not following commands. PSYCHIATRIC: The patient is awake but confused.  SKIN: as mentioned above.   Physical Exam LABORATORY PANEL:   CBC  Recent Labs Lab 07/29/16 0601  WBC 15.5*  HGB 9.9*  HCT 30.4*  PLT 798*   ------------------------------------------------------------------------------------------------------------------  Chemistries   Recent Labs Lab 07/23/16 1731  07/25/16 1724  07/30/16 1626  NA 132*  < >  --   < > 146*  K 3.7  < >  --   < > 5.0  CL 86*  < >  --   < > 116*  CO2 24  < >  --   < > 26  GLUCOSE 144*  < >  --   < > 130*  BUN 146*  < >  --   < > 39*  CREATININE 7.82*  < >  --   < > 1.60*  CALCIUM 8.3*  < >  --   < > 8.2*  MG  --   --  2.3  --   --   AST 71*  --   --   --   --   ALT 29  --   --   --   --   ALKPHOS 82  --   --   --   --  BILITOT 0.4  --   --   --   --   < > = values in this interval not displayed. ------------------------------------------------------------------------------------------------------------------  Cardiac Enzymes  Recent Labs Lab 07/23/16 1731 07/25/16 0353  TROPONINI 0.08* 0.28*   ------------------------------------------------------------------------------------------------------------------  RADIOLOGY:  No results found.  ASSESSMENT AND PLAN:   Active Problems:   Sepsis ([place])   Pressure injury of skin   Acute renal failure ([facility])   Lower extremity ulceration, right, with unspecified severity ([place])   Palliative care by specialist   Goals of care, counseling/discussion   DNR (do not resuscitate)   PAD (peripheral artery disease) ([place])   Terminal care  1. Sepsis, possibly  secondary to cellulitis, lower extremity wounds, bacteremia with enterococcus  He was on IV Zosyn and Vanco- switched to unasyn per ID. TTE as per ID: No blood clots/ vegetations. - pt have arterial occlusions on right LE- called vascular for follow up. - After meeting with palliative care, Family and neighbours agreed to stop IV fluids and give oral Abx, and if he deteriorates over weekend- start comfort care after talking to family. Other wise on Monday or Tuesday- transfer to hospice home. Patient has very poor oral intake and with multiple infected ulcers and vascular issues he has poor prognosis.  2. AKI He was on IV fluid hydration -Hold nephrotoxic medications (Cozaar, HCTZ) -Renal ultrasound showed medical renal disease -Monitor Urine Output , Foley catheter evidence of urinary retention - nephrology consult appreciated. gradually improving.  * Hyperkalemia. Given Kayexalate this morning, potassium decreased to 5.0 this afternoon.  3. Thrombocytosis, acute on chronic possibly reactive   Started on hydroxyurea.  4. Elevated troponin -Likely secondary to demand ischemia  follow on telemetry , troponin slightly high- may be due to infection and renal failure.  5. PAD -appreciated vascular consult- suggest conservative management for now. -Continue Eliquis and Plavix   As per doppler have occlusion in arteries, called vascular. - as per Dr. Lucky Cowboy- vascular reconstruction is not an option and suggested to have hospice.  6. History of hyperlipidemia -Continue Lipitor 7. History of asthma and allergies -Continue Allegra, fluticasone, Advair, Spiriva 8. History of GERD-continue Prilosec 9. History of hypertension -Hold Cozaar and HCTZ  10. Hypokalemia Improved with oral KCl.   Mg is normal.  All the records are reviewed and case discussed with Care Management/Social Workerr. Management plans discussed with the patient, family and they are in agreement.  CODE STATUS:  DNR  TOTAL TIME TAKING CARE OF THIS PATIENT: 37 minutes.   Palliative care consult to decide goals of care. Plan as above.  POSSIBLE D/C IN 2 DAYS, DEPENDING ON CLINICAL CONDITION.   Demetrios Loll M.D on 07/30/2016   Between 7am to 6pm - Pager - [phone]  After 6pm go to [email or web address] - password [email or web address] [facility]  Office  [phone]  CC: Primary care physician; Adin Hector, MD  Note: This dictation was prepared with Dragon dictation along with smaller phrase technology. Any transcriptional errors that result from this process are unintentional.

## 2016-07-31 LAB — CBC
HCT: 28.8 % — ABNORMAL LOW (ref 40.0–52.0)
HEMOGLOBIN: 9.6 g/dL — AB (ref 13.0–18.0)
MCH: 30.9 pg (ref 26.0–34.0)
MCHC: 33.3 g/dL (ref 32.0–36.0)
MCV: 92.7 fL (ref 80.0–100.0)
Platelets: 624 10*3/uL — ABNORMAL HIGH (ref 150–440)
RBC: 3.11 MIL/uL — ABNORMAL LOW (ref 4.40–5.90)
RDW: 15.8 % — ABNORMAL HIGH (ref 11.5–14.5)
WBC: 13.5 10*3/uL — ABNORMAL HIGH (ref 3.8–10.6)

## 2016-07-31 LAB — CULTURE, BLOOD (ROUTINE X 2)
Culture: NO GROWTH
Culture: NO GROWTH

## 2016-07-31 LAB — BASIC METABOLIC PANEL
Anion gap: 4 — ABNORMAL LOW (ref 5–15)
BUN: 38 mg/dL — AB (ref 6–20)
CHLORIDE: 115 mmol/L — AB (ref 101–111)
CO2: 27 mmol/L (ref 22–32)
CREATININE: 1.53 mg/dL — AB (ref 0.61–1.24)
Calcium: 8.3 mg/dL — ABNORMAL LOW (ref 8.9–10.3)
GFR calc Af Amer: 44 mL/min — ABNORMAL LOW (ref >60)
GFR calc non Af Amer: 38 mL/min — ABNORMAL LOW (ref >60)
GLUCOSE: 118 mg/dL — AB (ref 65–99)
POTASSIUM: 4.5 mmol/L (ref 3.5–5.1)
SODIUM: 146 mmol/L — AB (ref 135–145)

## 2016-07-31 MED ORDER — ORAL CARE MOUTH RINSE: 15.0000 mL | Freq: Two times a day (BID) | OROMUCOSAL | 0 refills | Status: AC

## 2016-07-31 MED ORDER — CHLORHEXIDINE GLUCONATE 0.12 % MT SOLN: 15.0000 mL | Freq: Two times a day (BID) | OROMUCOSAL | 0 refills | Status: AC

## 2016-07-31 MED ORDER — MORPHINE SULFATE (CONCENTRATE) 10 MG/0.5ML PO SOLN: 5.0000 mg | Freq: Three times a day (TID) | ORAL | Status: AC

## 2016-07-31 MED ORDER — MORPHINE SULFATE (CONCENTRATE) 10 MG/0.5ML PO SOLN: 5.0000 mg | ORAL | Status: AC | PRN

## 2016-07-31 MED ORDER — ENSURE ENLIVE PO LIQD: 237.0000 mL | ORAL | Status: DC | PRN

## 2016-07-31 MED ORDER — LORAZEPAM 2 MG/ML IJ SOLN: 0.5000 mg | INTRAMUSCULAR | 0 refills | Status: AC | PRN

## 2016-07-31 MED ORDER — BISACODYL 5 MG PO TBEC: 5.0000 mg | DELAYED_RELEASE_TABLET | Freq: Every day | ORAL | 0 refills | Status: AC | PRN

## 2016-07-31 MED ORDER — MORPHINE SULFATE (CONCENTRATE) 10 MG/0.5ML PO SOLN
5.0000 mg | Freq: Three times a day (TID) | ORAL | Status: DC
Start: 2016-07-31 — End: 2016-07-31
  Administered 2016-07-31 (×2): 5 mg via ORAL
  Filled 2016-07-31 (×2): qty 1

## 2016-07-31 MED ORDER — ENSURE ENLIVE PO LIQD: 237.0000 mL | ORAL | 12 refills | Status: AC | PRN

## 2016-07-31 NOTE — Discharge Instructions (Signed)
Hospice care

## 2016-07-31 NOTE — Progress Notes (Signed)
PT Cancellation Note  Patient Details Name: Andrew [name] MRN: [id number] DOB: 08-04-1922   Cancelled Treatment:    Reason Eval/Treat Not Completed: Fatigue/lethargy limiting ability to participate;Other (comment). Treatment attempted; pt lethargic and unable to effectively communicate. Pt attempts communication nodding head "yes" for therapy, then "no" immediately after. Pt continues to wave hand motioning therapist towards pt and repeats "wait"; pt unable to communicate anything further or respond to questioning. Pt rests closing eyes; no further PT attempts at that time. By the time of this documentation, new orders cancelling PT received and acknowledged.    Larae Grooms, PTA 07/31/2016, 12:07 PM

## 2016-07-31 NOTE — Plan of Care (Signed)
Problem: Education: Goal: Knowledge of [facility] Education information/materials will improve Outcome: Not Progressing Pt is confused   

## 2016-07-31 NOTE — Progress Notes (Signed)
Subjective:  Patient overall feeling better. Creatinine currently 1.6. This is close to his outpatient renal function. Ate some of his breakfast this AM.  Objective:  Vital signs in last 24 hours:  Temp:  [97.9 F (36.6 C)-99.1 F (37.3 C)] 97.9 F (36.6 C) (01/29 0511) Pulse Rate:  [106-111] 109 (01/29 0511) Resp:  [20] 20 (01/29 0511) BP: (113-130)/(61-69) 130/69 (01/29 0511) SpO2:  [99 %-100 %] 99 % (01/29 0734) FiO2 (%):  [32 %] 32 % (01/29 0734)  Weight change:  Filed Weights   07/23/16 1725 07/24/16 0356  Weight: 73.3 kg (161 lb 11.2 oz) 73 kg (160 lb 14.4 oz)    Intake/Output:   No intake or output data in the 24 hours ending 07/31/16 1418   Physical Exam: General: No acute distress  HEENT moist oral mucous membranes   Neck Supple  Pulm/lungs CTAB, normal effort  CVS/Heart Irregular, no rubs  Abdomen:  Soft, nontender   Extremities: Trace b/l LE edema  Neurologic: Awake, alert, conversant  Skin: Warm, good skin turgor.          Basic Metabolic Panel:   Recent Labs Lab 07/25/16 1724  07/27/16 0802 07/29/16 0601 07/30/16 0543 07/30/16 1626 07/31/16 0843  NA  --   < > 148* 143 146* 146* 146*  K  --   < > 4.3 5.2* 5.5* 5.0 4.5  CL  --   < > 117* 114* 114* 116* 115*  CO2  --   < > 25 26 25 26 27   GLUCOSE  --   < > 169* 128* 135* 130* 118*  BUN  --   < > 74* 47* 44* 39* 38*  CREATININE  --   < > 2.24* 1.81* 1.65* 1.60* 1.53*  CALCIUM  --   < > 8.3* 8.3* 8.1* 8.2* 8.3*  MG 2.3  --   --   --   --   --   --   < > = values in this interval not displayed.   CBC:  Recent Labs Lab 07/25/16 0353 07/26/16 0410 07/29/16 0601 07/31/16 0843  WBC 16.6* 17.0* 15.5* 13.5*  HGB 10.0* 10.4* 9.9* 9.6*  HCT 28.7* 30.4* 30.4* 28.8*  MCV 91.5 92.7 93.6 92.7  PLT 1,107* 1,120* 798* 624*      Microbiology:  Recent Results (from the past 720 hour(s))  Urine culture     Status: Abnormal   Collection Time: 07/11/16  9:00 AM  Result Value Ref Range  Status   Specimen Description URINE, RANDOM  Final   Special Requests NONE  Final   Culture MULTIPLE SPECIES PRESENT, SUGGEST RECOLLECTION (A)  Final   Report Status 07/13/2016 FINAL  Final  Blood culture (routine x 2)     Status: Abnormal   Collection Time: 07/23/16  6:49 PM  Result Value Ref Range Status   Specimen Description BLOOD LEFT HAND  Final   Special Requests   Final    BOTTLES DRAWN AEROBIC AND ANAEROBIC ANA13ML AER12ML   Culture  Setup Time   Final    GRAM POSITIVE COCCI ANAEROBIC BOTTLE ONLY CRITICAL RESULT CALLED TO, READ BACK BY AND VERIFIED WITH: CHRISTINE KATSOUDAS ON 07/24/16 AT 1245 QSD Performed at [facility], [place] [address] [place], [place] [address]    Culture ENTEROCOCCUS FAECALIS (A)  Final   Report Status 07/26/2016 FINAL  Final   Organism ID, Bacteria ENTEROCOCCUS FAECALIS  Final      Susceptibility   Enterococcus faecalis - MIC*  AMPICILLIN <=2 SENSITIVE Sensitive     VANCOMYCIN 1 SENSITIVE Sensitive     GENTAMICIN SYNERGY RESISTANT Resistant     * ENTEROCOCCUS FAECALIS  Blood culture (routine x 2)     Status: Abnormal   Collection Time: 07/23/16  6:49 PM  Result Value Ref Range Status   Specimen Description BLOOD LEFT ASSIST CONTROL  Final   Special Requests   Final    BOTTLES DRAWN AEROBIC AND ANAEROBIC ANA13ML AER13ML   Culture  Setup Time   Final    GRAM POSITIVE COCCI IN BOTH AEROBIC AND ANAEROBIC BOTTLES CRITICAL VALUE NOTED.  VALUE IS CONSISTENT WITH PREVIOUSLY REPORTED AND CALLED VALUE.    Culture (A)  Final    ENTEROCOCCUS FAECALIS SUSCEPTIBILITIES PERFORMED ON PREVIOUS CULTURE WITHIN THE LAST 5 DAYS. Performed at [facility], [place] [address], [place], [place] [address]    Report Status 07/26/2016 FINAL  Final  Blood Culture ID Panel (Reflexed)     Status: Abnormal   Collection Time: 07/23/16  6:49 PM  Result Value Ref Range Status   Enterococcus species DETECTED (A) NOT DETECTED Final    Comment: CRITICAL RESULT  CALLED TO, READ BACK BY AND VERIFIED WITH: CHRISTINE KATSOUDAS ON 07/24/16 AT 1245 QSD    Vancomycin resistance NOT DETECTED NOT DETECTED Final   Listeria monocytogenes NOT DETECTED NOT DETECTED Final   Staphylococcus species NOT DETECTED NOT DETECTED Final   Staphylococcus aureus NOT DETECTED NOT DETECTED Final   Streptococcus species NOT DETECTED NOT DETECTED Final   Streptococcus agalactiae NOT DETECTED NOT DETECTED Final   Streptococcus pneumoniae NOT DETECTED NOT DETECTED Final   Streptococcus pyogenes NOT DETECTED NOT DETECTED Final   Acinetobacter baumannii NOT DETECTED NOT DETECTED Final   Enterobacteriaceae species NOT DETECTED NOT DETECTED Final   Enterobacter cloacae complex NOT DETECTED NOT DETECTED Final   Escherichia coli NOT DETECTED NOT DETECTED Final   Klebsiella oxytoca NOT DETECTED NOT DETECTED Final   Klebsiella pneumoniae NOT DETECTED NOT DETECTED Final   Proteus species NOT DETECTED NOT DETECTED Final   Serratia marcescens NOT DETECTED NOT DETECTED Final   Haemophilus influenzae NOT DETECTED NOT DETECTED Final   Neisseria meningitidis NOT DETECTED NOT DETECTED Final   Pseudomonas aeruginosa NOT DETECTED NOT DETECTED Final   Candida albicans NOT DETECTED NOT DETECTED Final   Candida glabrata NOT DETECTED NOT DETECTED Final   Candida krusei NOT DETECTED NOT DETECTED Final   Candida parapsilosis NOT DETECTED NOT DETECTED Final   Candida tropicalis NOT DETECTED NOT DETECTED Final  MRSA PCR Screening     Status: Abnormal   Collection Time: 07/24/16  4:49 AM  Result Value Ref Range Status   MRSA by PCR POSITIVE (A) NEGATIVE Final    Comment:        The GeneXpert MRSA Assay (FDA approved for NASAL specimens only), is one component of a comprehensive MRSA colonization surveillance program. It is not intended to diagnose MRSA infection nor to guide or monitor treatment for MRSA infections. RESULT CALLED TO, READ BACK BY AND VERIFIED WITH: PHYLLIS KING ON 07/24/16  AT 0649 BY [facility]   Culture, blood (single) w Reflex to ID Panel     Status: None   Collection Time: 07/24/16  2:49 PM  Result Value Ref Range Status   Specimen Description BLOOD LEFT AC  Final   Special Requests   Final    BOTTLES DRAWN AEROBIC AND ANAEROBIC AER 10ML ANA 14ML   Culture NO GROWTH 5 DAYS  Final  Report Status 07/29/2016 FINAL  Final  CULTURE, BLOOD (ROUTINE X 2) w Reflex to ID Panel     Status: None   Collection Time: 07/26/16  2:10 PM  Result Value Ref Range Status   Specimen Description BLOOD L AC  Final   Special Requests BOTTLES DRAWN AEROBIC AND ANAEROBIC  7 ML  Final   Culture NO GROWTH 5 DAYS  Final   Report Status 07/31/2016 FINAL  Final  CULTURE, BLOOD (ROUTINE X 2) w Reflex to ID Panel     Status: None   Collection Time: 07/26/16  2:55 PM  Result Value Ref Range Status   Specimen Description BLOOD R AC  Final   Special Requests BOTTLES DRAWN AEROBIC AND ANAEROBIC ANA 9 AER 7 ML  Final   Culture NO GROWTH 5 DAYS  Final   Report Status 07/31/2016 FINAL  Final    Coagulation Studies: No results for input(s): LABPROT, INR in the last 72 hours.  Urinalysis: No results for input(s): COLORURINE, LABSPEC, PHURINE, GLUCOSEU, HGBUR, BILIRUBINUR, KETONESUR, PROTEINUR, UROBILINOGEN, NITRITE, LEUKOCYTESUR in the last 72 hours.  Invalid input(s): APPERANCEUR    Imaging: No results found.   Medications:    . albuterol  2.5 mg Nebulization BID  . chlorhexidine  15 mL Mouth Rinse BID  . fluticasone  2 spray Each Nare Daily  . mouth rinse  15 mL Mouth Rinse q12n4p  . morphine CONCENTRATE  5 mg Oral TID  . nystatin   Topical TID  . polyvinyl alcohol  1-2 drop Both Eyes Daily   acetaminophen **OR** acetaminophen, albuterol, bisacodyl, feeding supplement (ENSURE ENLIVE), LORazepam, morphine CONCENTRATE, naphazoline-glycerin, ondansetron **OR** ondansetron (ZOFRAN) IV, traMADol  Assessment/ Plan:  83 y.o. male With asthma, chronic kidney disease, hypertension,  peripheral vascular disease, nursing home resident at [facility].  Known to our practice from outpatient. He is followed by Dr. Holley Raring for CKD  Admitted for AMS which is thought to be secondary to sepsis from cellulitis of lower extremity wounds. His baseline creatinine appears to be 1.10 from December 3  1. Acute kidney injury 2. Chronic kidney disease stage III 3. Sepsis from lower extremity wounds, enterococcus bacteremia 4. Hypokalemia 5. hypernatremia  Acute kidney injury is likely secondary to severe dehydration leading to possibly ATN. Sepsis, bacteremia and cellulitis are also playing a role.  Plan:  Renal function has significantly improved over the course of the hospitalization.  Creatinine currently down to 1.5 and BUN is 38.  Patient still has mild hypernatremia with a serum sodium of 146 today.  He was encouraged to continue to drink free water as tolerated.  We were following him for his outpatient chronic kidney disease along with proteinuria.  We will continue to monitor this if his overall status improves and he becomes more mobile.   LOS: 7 Jennipher Weatherholtz 1/29/20182:18 PM

## 2016-07-31 NOTE — Progress Notes (Signed)
Nutrition Brief Note  Patient initially followed by Dietitian for Low [place]. Chart reviewed. Pt now transitioning to comfort care and will likely discharge to hospice house. Plan is to discontinue all medications and interventions not directly related to comfort.  Patient remains on soft diet for comfort feeding. RD will make Ensure Enlive PRN only. Can be discontinued if patient does not enjoy Ensure. No further nutrition interventions warranted at this time.  Please consult Dietitian as needed.   Willey Blade, MS, RD, LDN Pager: [phone] After Hours Pager: [phone]

## 2016-07-31 NOTE — Discharge Summary (Signed)
[facility] NAME: Andrew [name]    MR#:  [id number]  DATE OF BIRTH:  22-Jan-1923  DATE OF ADMISSION:  07/23/2016   ADMITTING PHYSICIAN: Harvie Bridge, DO  DATE OF DISCHARGE: 07/31/2016  PRIMARY CARE PHYSICIAN: Tama High III, MD   ADMISSION DIAGNOSIS:  Dehydration [E86.0] PAD (peripheral artery disease) (HCC) [I73.9] Sepsis, due to unspecified organism ([place]) [A41.9] Acute renal failure, unspecified acute renal failure type ([place]) [N17.9] DISCHARGE DIAGNOSIS:  Active Problems:   Sepsis ([place])   Pressure injury of skin   Acute renal failure (HCC)   Lower extremity ulceration, right, with unspecified severity ([place])   Palliative care by specialist   Goals of care, counseling/discussion   DNR (do not resuscitate)   PAD (peripheral artery disease) ([place])   Terminal care  SECONDARY DIAGNOSIS:   Past Medical History:  Diagnosis Date  . Asthma   . Chronic kidney disease   . Hypertension   . Peripheral vascular disease ([place])    HOSPITAL COURSE:  1. Sepsis, possibly secondary to cellulitis, lower extremity wounds, bacteremia with enterococcus  He was on IV Zosyn and Vanco- switched to unasyn per ID. TTE as per ID: No blood clots/ vegetations. - pt have arterial occlusions on right LE- called vascular for follow up. - After meeting with palliative care, Family and neighbours agreed to stop IV fluids and give oral Abx, and if he deteriorates over weekend- start comfort care after talking to family. Other wise on Monday or Tuesday- transfer to hospice home. Patient has very poor oral intake and with multiple infected ulcers and vascular issues he has poor prognosis.  2. AKI He was on IV fluid hydration -Hold nephrotoxic medications (Cozaar, HCTZ) -Renal ultrasound showed medical renal disease -Monitor Urine Output , Foley catheter evidence of urinary retention - nephrology consult appreciated. gradually improving.  *  Hyperkalemia. Given Kayexalate and improved. 3. Thrombocytosis, acute on chronic possibly reactive   Started on hydroxyurea.  4. Elevated troponin -Likely secondary to demand ischemia  follow on telemetry , troponin slightly high- may be due to infection and renal failure.  5. PAD -appreciated vascular consult- suggest conservative management for now. -Continue Eliquis and Plavix   As per doppler have occlusion in arteries, called vascular. - as per Dr. Lucky Cowboy- vascular reconstruction is not an option and suggested to have hospice.  6. History of hyperlipidemia -Continue Lipitor 7. History of asthma and allergies Treated with Allegra, fluticasone, Advair, Spiriva 8. History of GERD-continue Prilosec 9. History of hypertension -HoldCozaar and HCTZ  10. Hypokalemia Improved with oral KCl.   Mg is normal.  Patient has very poor prognosis, discharge to hospice home today.  DISCHARGE CONDITIONS:  Poor prognosis, discharge to hospice home today. CONSULTS OBTAINED:  Treatment Team:  Katha Cabal, MD Murlean Iba, MD Cammie Sickle, MD DRUG ALLERGIES:   Allergies  Allergen Reactions  . Keflex [Cephalexin] Other (See Comments)    Patient states this medication made his legs hurt really bad.   DISCHARGE MEDICATIONS:   Allergies as of 07/31/2016      Reactions   Keflex [cephalexin] Other (See Comments)   Patient states this medication made his legs hurt really bad.      Medication List    STOP taking these medications   apixaban 2.5 MG Tabs tablet Commonly known as:  ELIQUIS   aspirin EC 81 MG tablet   atorvastatin 20 MG tablet Commonly known as:  LIPITOR  Cholecalciferol 1000 units capsule   clopidogrel 75 MG tablet Commonly known as:  PLAVIX   fexofenadine 180 MG tablet Commonly known as:  ALLEGRA   Fluticasone-Salmeterol 250-50 MCG/DOSE Aepb Commonly known as:  ADVAIR   hydrochlorothiazide 12.5 MG capsule Commonly known as:  MICROZIDE     levothyroxine 75 MCG tablet Commonly known as:  SYNTHROID, LEVOTHROID   losartan 25 MG tablet Commonly known as:  COZAAR   omeprazole 20 MG capsule Commonly known as:  PRILOSEC   polyvinyl alcohol-povidone 1.4-0.6 % ophthalmic solution Commonly known as:  HYPOTEARS   PRESERVISION/LUTEIN Caps   SPIRIVA HANDIHALER 18 MCG inhalation capsule Generic drug:  tiotropium   traMADol 50 MG tablet Commonly known as:  ULTRAM   VISINE MAXIMUM REDNESS RELIEF 0.05-0.2-0.36-1 % Soln Generic drug:  Tetrahydroz-Glyc-Hyprom-PEG   Vitamin B-12 2500 MCG Subl   vitamin E 400 UNIT capsule     TAKE these medications   albuterol (2.5 MG/3ML) 0.083% nebulizer solution Commonly known as:  PROVENTIL Take 2.5 mg by nebulization 2 (two) times daily.   bisacodyl 5 MG EC tablet Commonly known as:  DULCOLAX Take 1 tablet (5 mg total) by mouth daily as needed for moderate constipation.   chlorhexidine 0.12 % solution Commonly known as:  PERIDEX 15 mLs by Mouth Rinse route 2 (two) times daily.   feeding supplement (ENSURE ENLIVE) Liqd Take 237 mLs by mouth as needed (As desired by patient. Not true intervention - comfort care).   fluticasone 27.5 MCG/SPRAY nasal spray Commonly known as:  VERAMYST Place 2 sprays into the nose daily.   LORazepam 2 MG/ML injection Commonly known as:  ATIVAN Inject 0.25 mLs (0.5 mg total) into the vein every 4 (four) hours as needed for anxiety (restlessness).   morphine CONCENTRATE 10 MG/0.5ML Soln concentrated solution Place 0.25 mLs (5 mg total) under the tongue every 2 (two) hours as needed for moderate pain, severe pain or shortness of breath (air hunger).   morphine CONCENTRATE 10 MG/0.5ML Soln concentrated solution Take 0.25 mLs (5 mg total) by mouth 3 (three) times daily.   mouth rinse Liqd solution 15 mLs by Mouth Rinse route 2 times daily at 12 noon and 4 pm.        DISCHARGE INSTRUCTIONS:  See AVS.  If you experience worsening of your  admission symptoms, develop shortness of breath, life threatening emergency, suicidal or homicidal thoughts you must seek medical attention immediately by calling 911 or calling your MD immediately  if symptoms less severe.  You Must read complete instructions/literature along with all the possible adverse reactions/side effects for all the Medicines you take and that have been prescribed to you. Take any new Medicines after you have completely understood and accpet all the possible adverse reactions/side effects.   Please note  You were cared for by a hospitalist during your hospital stay. If you have any questions about your discharge medications or the care you received while you were in the hospital after you are discharged, you can call the unit and asked to speak with the hospitalist on call if the hospitalist that took care of you is not available. Once you are discharged, your primary care physician will handle any further medical issues. Please note that NO REFILLS for any discharge medications will be authorized once you are discharged, as it is imperative that you return to your primary care physician (or establish a relationship with a primary care physician if you do not have one) for your aftercare needs so that  they can reassess your need for medications and monitor your lab values.    On the day of Discharge:  VITAL SIGNS:  Blood pressure 130/69, pulse (!) 109, temperature 97.9 F (36.6 C), temperature source Oral, resp. rate 20, height 5\' 9"  (1.753 m), weight 160 lb 14.4 oz (73 kg), SpO2 99 %. PHYSICAL EXAMINATION:  GENERAL:  83 y.o.-year-old patient lying in the bed with no acute distress.  EYES: Pupils equal, round, reactive to light and accommodation. No scleral icterus. Extraocular muscles intact.  HEENT: Head atraumatic, normocephalic. Dry mucosa. NECK:  Supple, no jugular venous distention. No thyroid enlargement, no tenderness.  LUNGS: Normal breath sounds bilaterally, no  wheezing, rales,rhonchi or crepitation. No use of accessory muscles of respiration.  CARDIOVASCULAR: S1, S2 normal. No murmurs, rubs, or gallops.  ABDOMEN: Soft, non-tender, non-distended. Bowel sounds present. No organomegaly or mass.  EXTREMITIES: No pedal edema, cyanosis, or clubbing.  NEUROLOGIC: unable to exam. PSYCHIATRIC: The patient is awake but confused.  SKIN: No obvious rash, lesion, or ulcer.  DATA REVIEW:   CBC  Recent Labs Lab 07/31/16 0843  WBC 13.5*  HGB 9.6*  HCT 28.8*  PLT 624*    Chemistries   Recent Labs Lab 07/25/16 1724  07/31/16 0843  NA  --   < > 146*  K  --   < > 4.5  CL  --   < > 115*  CO2  --   < > 27  GLUCOSE  --   < > 118*  BUN  --   < > 38*  CREATININE  --   < > 1.53*  CALCIUM  --   < > 8.3*  MG 2.3  --   --   < > = values in this interval not displayed.   Microbiology Results  Results for orders placed or performed during the hospital encounter of 07/23/16  Blood culture (routine x 2)     Status: Abnormal   Collection Time: 07/23/16  6:49 PM  Result Value Ref Range Status   Specimen Description BLOOD LEFT HAND  Final   Special Requests   Final    BOTTLES DRAWN AEROBIC AND ANAEROBIC ANA13ML AER12ML   Culture  Setup Time   Final    GRAM POSITIVE COCCI ANAEROBIC BOTTLE ONLY CRITICAL RESULT CALLED TO, READ BACK BY AND VERIFIED WITH: CHRISTINE KATSOUDAS ON 07/24/16 AT 1245 QSD Performed at [facility], [address], [place], [place] [address]    Culture ENTEROCOCCUS FAECALIS (A)  Final   Report Status 07/26/2016 FINAL  Final   Organism ID, Bacteria ENTEROCOCCUS FAECALIS  Final      Susceptibility   Enterococcus faecalis - MIC*    AMPICILLIN <=2 SENSITIVE Sensitive     VANCOMYCIN 1 SENSITIVE Sensitive     GENTAMICIN SYNERGY RESISTANT Resistant     * ENTEROCOCCUS FAECALIS  Blood culture (routine x 2)     Status: Abnormal   Collection Time: 07/23/16  6:49 PM  Result Value Ref Range Status   Specimen Description BLOOD LEFT  ASSIST CONTROL  Final   Special Requests   Final    BOTTLES DRAWN AEROBIC AND ANAEROBIC ANA13ML AER13ML   Culture  Setup Time   Final    GRAM POSITIVE COCCI IN BOTH AEROBIC AND ANAEROBIC BOTTLES CRITICAL VALUE NOTED.  VALUE IS CONSISTENT WITH PREVIOUSLY REPORTED AND CALLED VALUE.    Culture (A)  Final    ENTEROCOCCUS FAECALIS SUSCEPTIBILITIES PERFORMED ON PREVIOUS CULTURE WITHIN THE LAST 5 DAYS.  Performed at [facility], [place] [address] [place], [place] [address]    Report Status 07/26/2016 FINAL  Final  Blood Culture ID Panel (Reflexed)     Status: Abnormal   Collection Time: 07/23/16  6:49 PM  Result Value Ref Range Status   Enterococcus species DETECTED (A) NOT DETECTED Final    Comment: CRITICAL RESULT CALLED TO, READ BACK BY AND VERIFIED WITH: CHRISTINE KATSOUDAS ON 07/24/16 AT 1245 QSD    Vancomycin resistance NOT DETECTED NOT DETECTED Final   Listeria monocytogenes NOT DETECTED NOT DETECTED Final   Staphylococcus species NOT DETECTED NOT DETECTED Final   Staphylococcus aureus NOT DETECTED NOT DETECTED Final   Streptococcus species NOT DETECTED NOT DETECTED Final   Streptococcus agalactiae NOT DETECTED NOT DETECTED Final   Streptococcus pneumoniae NOT DETECTED NOT DETECTED Final   Streptococcus pyogenes NOT DETECTED NOT DETECTED Final   Acinetobacter baumannii NOT DETECTED NOT DETECTED Final   Enterobacteriaceae species NOT DETECTED NOT DETECTED Final   Enterobacter cloacae complex NOT DETECTED NOT DETECTED Final   Escherichia coli NOT DETECTED NOT DETECTED Final   Klebsiella oxytoca NOT DETECTED NOT DETECTED Final   Klebsiella pneumoniae NOT DETECTED NOT DETECTED Final   Proteus species NOT DETECTED NOT DETECTED Final   Serratia marcescens NOT DETECTED NOT DETECTED Final   Haemophilus influenzae NOT DETECTED NOT DETECTED Final   Neisseria meningitidis NOT DETECTED NOT DETECTED Final   Pseudomonas aeruginosa NOT DETECTED NOT DETECTED Final   Candida albicans NOT  DETECTED NOT DETECTED Final   Candida glabrata NOT DETECTED NOT DETECTED Final   Candida krusei NOT DETECTED NOT DETECTED Final   Candida parapsilosis NOT DETECTED NOT DETECTED Final   Candida tropicalis NOT DETECTED NOT DETECTED Final  MRSA PCR Screening     Status: Abnormal   Collection Time: 07/24/16  4:49 AM  Result Value Ref Range Status   MRSA by PCR POSITIVE (A) NEGATIVE Final    Comment:        The GeneXpert MRSA Assay (FDA approved for NASAL specimens only), is one component of a comprehensive MRSA colonization surveillance program. It is not intended to diagnose MRSA infection nor to guide or monitor treatment for MRSA infections. RESULT CALLED TO, READ BACK BY AND VERIFIED WITH: PHYLLIS KING ON 07/24/16 AT 0649 BY [facility]   Culture, blood (single) w Reflex to ID Panel     Status: None   Collection Time: 07/24/16  2:49 PM  Result Value Ref Range Status   Specimen Description BLOOD LEFT AC  Final   Special Requests   Final    BOTTLES DRAWN AEROBIC AND ANAEROBIC AER 10ML ANA 14ML   Culture NO GROWTH 5 DAYS  Final   Report Status 07/29/2016 FINAL  Final  CULTURE, BLOOD (ROUTINE X 2) w Reflex to ID Panel     Status: None   Collection Time: 07/26/16  2:10 PM  Result Value Ref Range Status   Specimen Description BLOOD L AC  Final   Special Requests BOTTLES DRAWN AEROBIC AND ANAEROBIC  7 ML  Final   Culture NO GROWTH 5 DAYS  Final   Report Status 07/31/2016 FINAL  Final  CULTURE, BLOOD (ROUTINE X 2) w Reflex to ID Panel     Status: None   Collection Time: 07/26/16  2:55 PM  Result Value Ref Range Status   Specimen Description BLOOD R AC  Final   Special Requests BOTTLES DRAWN AEROBIC AND ANAEROBIC ANA 9 AER 7 ML  Final   Culture  NO GROWTH 5 DAYS  Final   Report Status 07/31/2016 FINAL  Final    RADIOLOGY:  No results found.   Management plans discussed with the patient, family and they are in agreement.  CODE STATUS:     Code Status Orders        Start      Ordered   07/26/16 1511  Do not attempt resuscitation (DNR)  Continuous    Question Answer Comment  In the event of cardiac or respiratory ARREST Do not call a "code blue"   In the event of cardiac or respiratory ARREST Do not perform Intubation, CPR, defibrillation or ACLS   In the event of cardiac or respiratory ARREST Use medication by any route, position, wound care, and other measures to relive pain and suffering. May use oxygen, suction and manual treatment of airway obstruction as needed for comfort.      07/26/16 1510    Code Status History    Date Active Date Inactive Code Status Order ID Comments User Context   07/24/2016  3:43 AM 07/26/2016  3:10 PM Full Code [id number]  [facility], DO Inpatient   06/04/2016  7:53 AM 06/06/2016  9:17 PM Full Code [id number]  Terrilyn Saver, RN Inpatient   03/27/2016  5:39 PM 03/31/2016  7:32 PM Full Code [id number]  Vaughan Basta, MD Inpatient   02/07/2016 12:31 PM 02/07/2016  6:41 PM Full Code [id number]  Algernon Huxley, MD Inpatient      TOTAL TIME TAKING CARE OF THIS PATIENT: 36 minutes.    Demetrios Loll M.D on 07/31/2016 at 1:07 PM  Between 7am to 6pm - Pager - [phone]  After 6pm go to [email or web address] - Proofreader  [facility]  Office  [phone]  CC: Primary care physician; Adin Hector, MD   Note: This dictation was prepared with Dragon dictation along with smaller phrase technology. Any transcriptional errors that result from this process are unintentional.

## 2016-07-31 NOTE — Progress Notes (Signed)
New hospice home referral received from [facility] following a palliative Medicine consult. Mr. Nunziata is a 83 year old man with past medical history of CKD, HTN, PVD/PAD with multiple revascularizations, and asthma admitted on 07/23/2016 with acute renal failure from [facility]. He has bilateral nonhealing ulcerations of lower extremities, right greater than left. This admit, found to have cellulitis, bacteremia, and acute renal failure. He is a known patient of vascular surgery due to multiple previous revascularizations to right lower extremity. 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  Patient has failed to improve des[pite medical interventions, he is lethargic, eating only bites and  Requiring liquid morphine for pain control related to his wounds. Palliative Medicine PA Imogene Burn met this morning with patient's neighbor/friend Zigmund Daniel and his son [name] who have chosen to focus on comfort with transfer to the hospice home.  Writer met in the room with [name] and Zigmund Daniel to initiate education regarding hospice services, philosophy and team approach to care with good understanding voiced. Questions answered consents signed. Patient seen lying in bed, eyes closed, noisy respirations noted. He was able to eat a few bites when awakened by Zigmund Daniel. He is lethargic and nonverbal. Patient information faxed to referral, report called to the hospice home. EMS notified for transport. Hospital care team and family aware. Thank you. Flo Shanks RN, BSN, [facility] [place], hospital Liaison [phone] c

## 2016-07-31 NOTE — Care Management Obs Status (Addendum)
[facility] NOTIFICATION   Patient Details  Name: KAHLER WEHBE MRN: [id number] Date of Birth: [birth year]   Medicare Observation Status Notification Given:  Yes    Shelbie Ammons, RN 07/31/2016, 10:07 AM

## 2016-07-31 NOTE — Progress Notes (Addendum)
2:37 PM Accepted to [facility] for admission today. Neighbor and son both aware of DC and have met with hospice. Patient will transport by EMS. Packet completed.   LCSW following for disposition. Patient has been recommended for the hospice home.  Discussed options with Zigmund Daniel (neighbor) who would like [facility].  Referral made with hospice liaison Zigmund Daniel will be back up at the hospital at 1:00pm. There is a bed available per Lenise Herald will follow up.  Lane Hacker, MSW Clinical Social Work: Printmaker Coverage for :  [phone]

## 2016-07-31 NOTE — Progress Notes (Signed)
Daily Progress Note   Patient Name: Andrew [name]       Date: 07/31/2016 DOB: [birth year]  Age: 83 y.o. MRN#: [id number] Attending Physician: Demetrios Loll, MD Primary Care Physician: Adin Hector, MD Admit Date: 07/23/2016  Reason for Consultation/Follow-up: Establishing goals of care, Hospice Evaluation and Non pain symptom management  Subjective: Patient lethargic unable to really speak.  Brow furrowed.  Son and neighbor at bedside.  Neighbor still trying to feed patient.  I explained that sips and small bites are ok if he does not struggle / cough with them and if he enjoys them.    Son and neighbor agreeable to hospice house.  Son comments that other family members have gone to hospice home and received excellent treatment.   Assessment: Patient with increased work of breathing, pale, eating minimal amounts.  Appears to be in the dying process.   Patient Profile/HPI: 83 y.o. male  with past medical history of CKD, HTN, PVD/PAD with multiple revascularizations, and asthma admitted on 07/23/2016 with acute renal failure from [facility]. He has bilateral nonhealing ulcerations of lower extremities, R>L. This admit, found to have cellulitis, bacteremia, and acute renal failure. He is a known patient of vascular surgery due to multiple previous revascularizations to right lower extremity. Patient has been told he would need an amputation if pain was intractable or infection was not controlled by wound care and antibiotics. 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 Conservative management for now. Followed by nephrology for renal failure and also known patient to the practice-receiving IVF and supportive care. Followed by infectious disease for bacteremia and receiving  unasyn. Palliative medicine consultation for goals of care.   Length of Stay: 7  Current Medications: Scheduled Meds:  . albuterol  2.5 mg Nebulization BID  . amoxicillin-clavulanate  600 mg of amoxicillin Oral BID  . apixaban  2.5 mg Oral BID  . aspirin EC  81 mg Oral Daily  . atorvastatin  20 mg Oral QPM  . chlorhexidine  15 mL Mouth Rinse BID  . cholecalciferol  1,000 Units Oral BH-q7a  . clopidogrel  75 mg Oral Daily  . feeding supplement (ENSURE ENLIVE)  237 mL Oral TID WC  . fluticasone  2 spray Each Nare Daily  . hydroxyurea  500 mg Oral  Daily  . levothyroxine  75 mcg Oral QAC breakfast  . loratadine  10 mg Oral Daily  . mouth rinse  15 mL Mouth Rinse q12n4p  . mometasone-formoterol  2 puff Inhalation BID  . morphine CONCENTRATE  5 mg Oral TID  . multivitamin-lutein  1 capsule Oral BID  . nystatin   Topical TID  . pantoprazole  40 mg Oral QAC breakfast  . polyvinyl alcohol  1-2 drop Both Eyes Daily  . tiotropium  18 mcg Inhalation Daily  . vitamin B-12  2,500 mcg Oral BH-q7a  . vitamin E  400 Units Oral BH-q7a    Continuous Infusions:   PRN Meds: acetaminophen **OR** acetaminophen, albuterol, bisacodyl, LORazepam, magnesium citrate, morphine CONCENTRATE, naphazoline-glycerin, ondansetron **OR** ondansetron (ZOFRAN) IV, oxyCODONE, senna-docusate, traMADol  Physical Exam        Elderly frail gentleman, pale, snoring CV distant heart sounds Resp mild increased work of breathing.  Decreased breath sounds Abdomen soft NT Extremities:  Both lower ext with wounds, bandages are clean and dry.  Vital Signs: BP 130/69 (BP Location: Right Arm)   Pulse (!) 109   Temp 97.9 F (36.6 C) (Oral)   Resp 20   Ht 5\' 9"  (1.753 m)   Wt 73 kg (160 lb 14.4 oz)   SpO2 99%   BMI 23.76 kg/m  SpO2: SpO2: 99 % O2 Device: O2 Device: Nasal Cannula O2 Flow Rate: O2 Flow Rate (L/min): 3 L/min  Intake/output summary:   Intake/Output Summary (Last 24 hours) at 07/31/16 1106 Last  data filed at 07/31/16 [id number]  Gross per 24 hour  Intake                0 ml  Output              600 ml  Net             -600 ml   LBM: Last BM Date: 07/29/16 Baseline Weight: Weight: 73.3 kg (161 lb 11.2 oz) Most recent weight: Weight: 73 kg (160 lb 14.4 oz)       Palliative Assessment/Data:    Flowsheet Rows   Flowsheet Row Most Recent Value  Intake Tab  Referral Department  Hospitalist  Unit at Time of Referral  Oncology Unit  Palliative Care Primary Diagnosis  Sepsis/Infectious Disease  Date Notified  07/25/16  Palliative Care Type  New Palliative care  Reason for referral  Clarify Goals of Care  Date of Admission  07/23/16  Date first seen by Palliative Care  07/26/16  # of days Palliative referral response time  1 Day(s)  # of days IP prior to Palliative referral  2  Clinical Assessment  Palliative Performance Scale Score  10%  Psychosocial & Spiritual Assessment  Palliative Care Outcomes  Patient/Family meeting held?  Yes  Who was at the meeting?  son and friend  Palliative Care Outcomes  Transitioned to hospice      Patient Active Problem List   Diagnosis Date Noted  . Terminal care   . Acute renal failure ([place])   . Lower extremity ulceration, right, with unspecified severity ([place])   . Palliative care by specialist   . Goals of care, counseling/discussion   . DNR (do not resuscitate)   . PAD (peripheral artery disease) ([place])   . Sepsis ([place]) 07/24/2016  . Pressure injury of skin 07/24/2016  . Peripheral vascular disease ([place]) 06/01/2016  . Lower limb ulcer, ankle, right, limited to breakdown of skin ([place]) 04/14/2016  . Hyperlipidemia  04/14/2016  . Essential hypertension 04/14/2016  . Atherosclerosis of native artery of right lower extremity with ulceration of ankle ([place]) 04/14/2016  . Swelling of limb 04/14/2016  . Cellulitis 03/27/2016  . Cold foot with peripheral vascular disease ([place]) 03/27/2016  . Chronic venous hypertension with ulcer ([place])  01/13/2016    Palliative Care Plan    Recommendations/Plan:  Transition to hospice house.  Will d/c medications and interventions not directly related to comfort.  Goals of Care and Additional Recommendations:  Limitations on Scope of Treatment: Full Comfort Care  Code Status:  DNR  Prognosis:   < 2 weeks   Discharge Planning:  Hospice facility  Care plan was discussed with son, neighbor, social work, case management  Thank you for allowing the Palliative Medicine Team to assist in the care of this patient.  Total time spent:  35 min.     Greater than 50%  of this time was spent counseling and coordinating care related to the above assessment and plan.  Imogene Burn, PA-C Palliative Medicine  Please contact Palliative MedicineTeam phone at [phone] for questions and concerns between 7 am - 7 pm.   Please see AMION for individual provider pager numbers.

## 2016-08-08 LAB — JAK2 W/REFLEX TO CALR/MPL

## 2016-08-31 DEATH — deceased

## 2017-06-06 IMAGING — CR DG CHEST 2V
1 series · 2 of 2 positions shown · non-contrast
Comparison: CT scan of the chest September 22, 2010

CLINICAL DATA: Shortness of breath, right lower extremity wound
undergoing care inter wound care facility.

EXAM:
CHEST  2 VIEW

[Series 1: dg chest 2 view · 0.14mm/px · 2 of 2 slices shown]
[im 1/2]
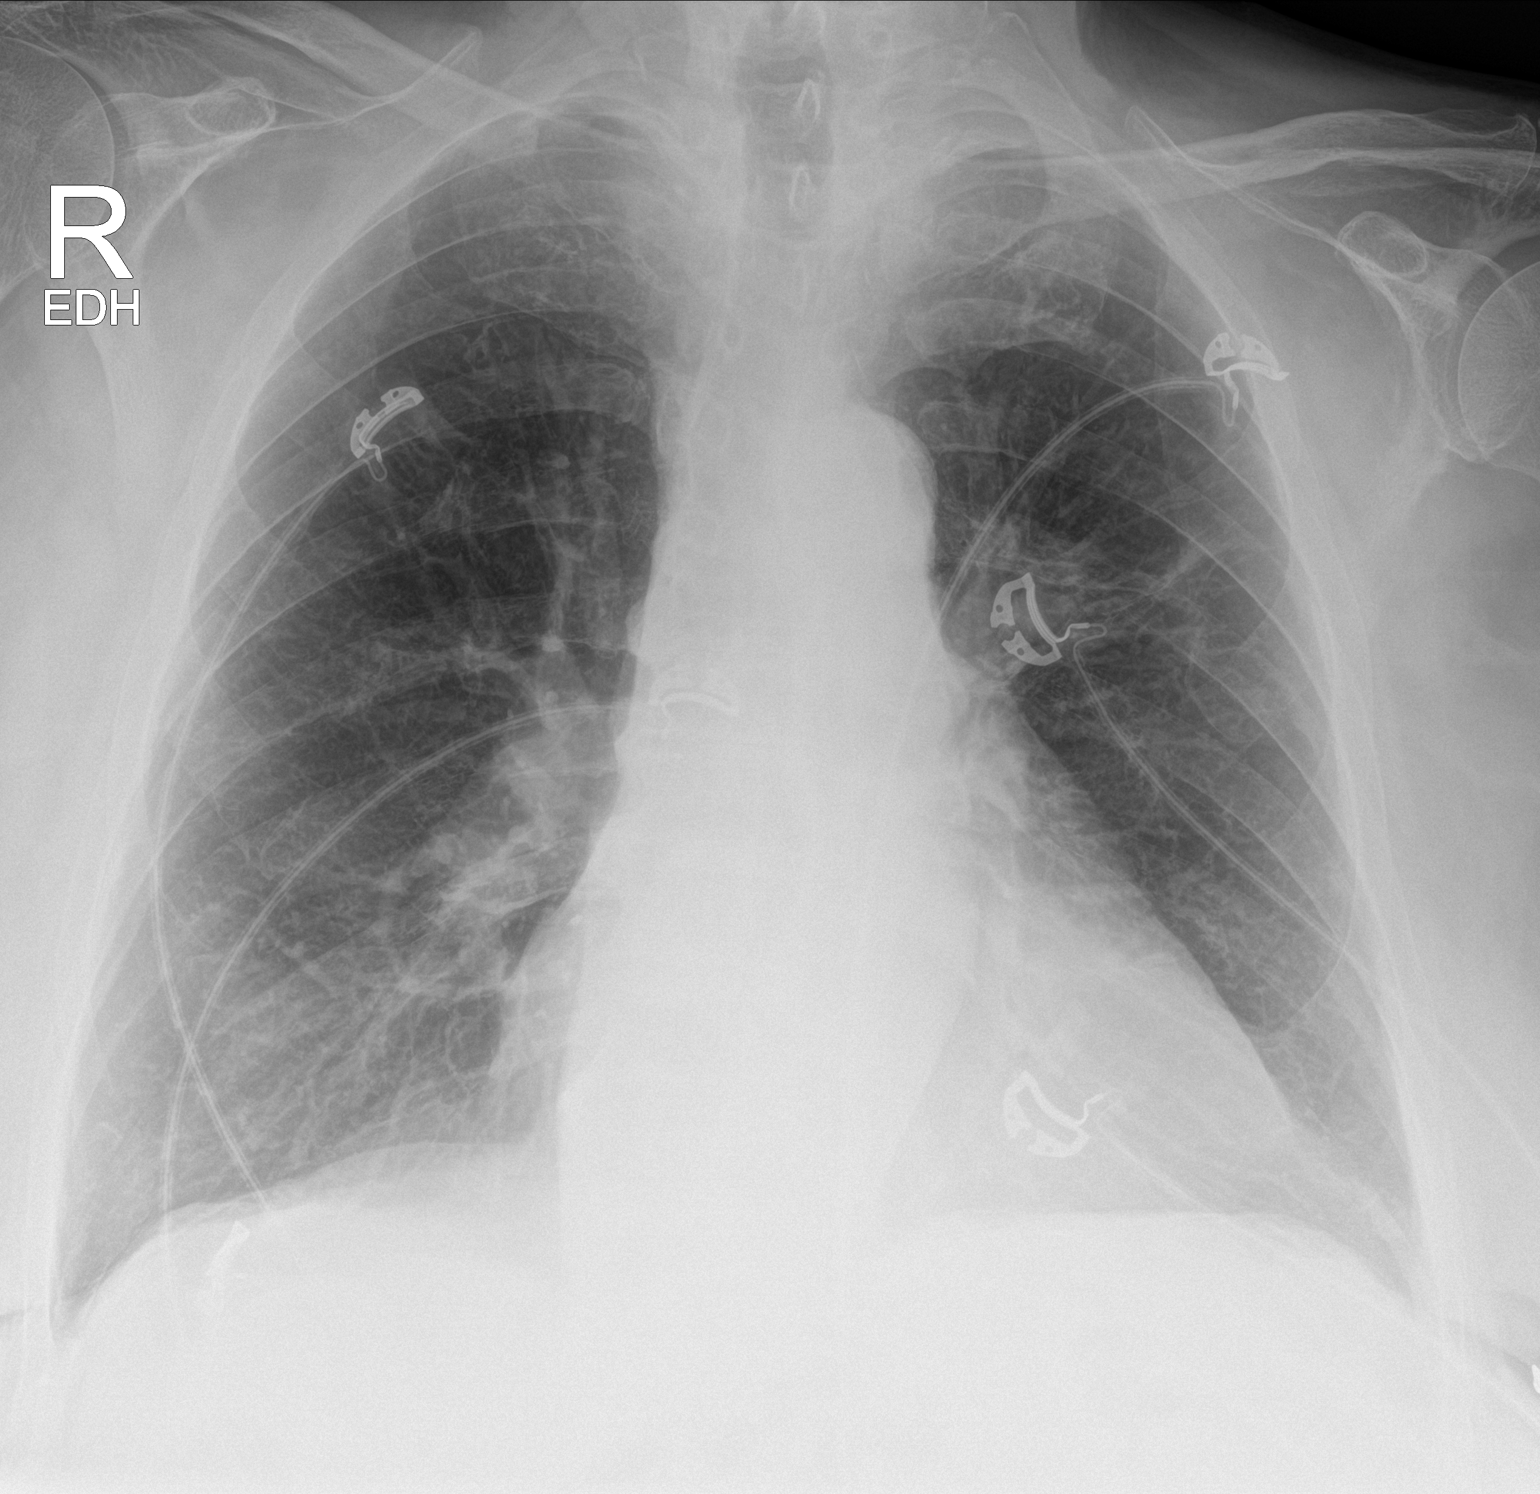
[im 2/2]
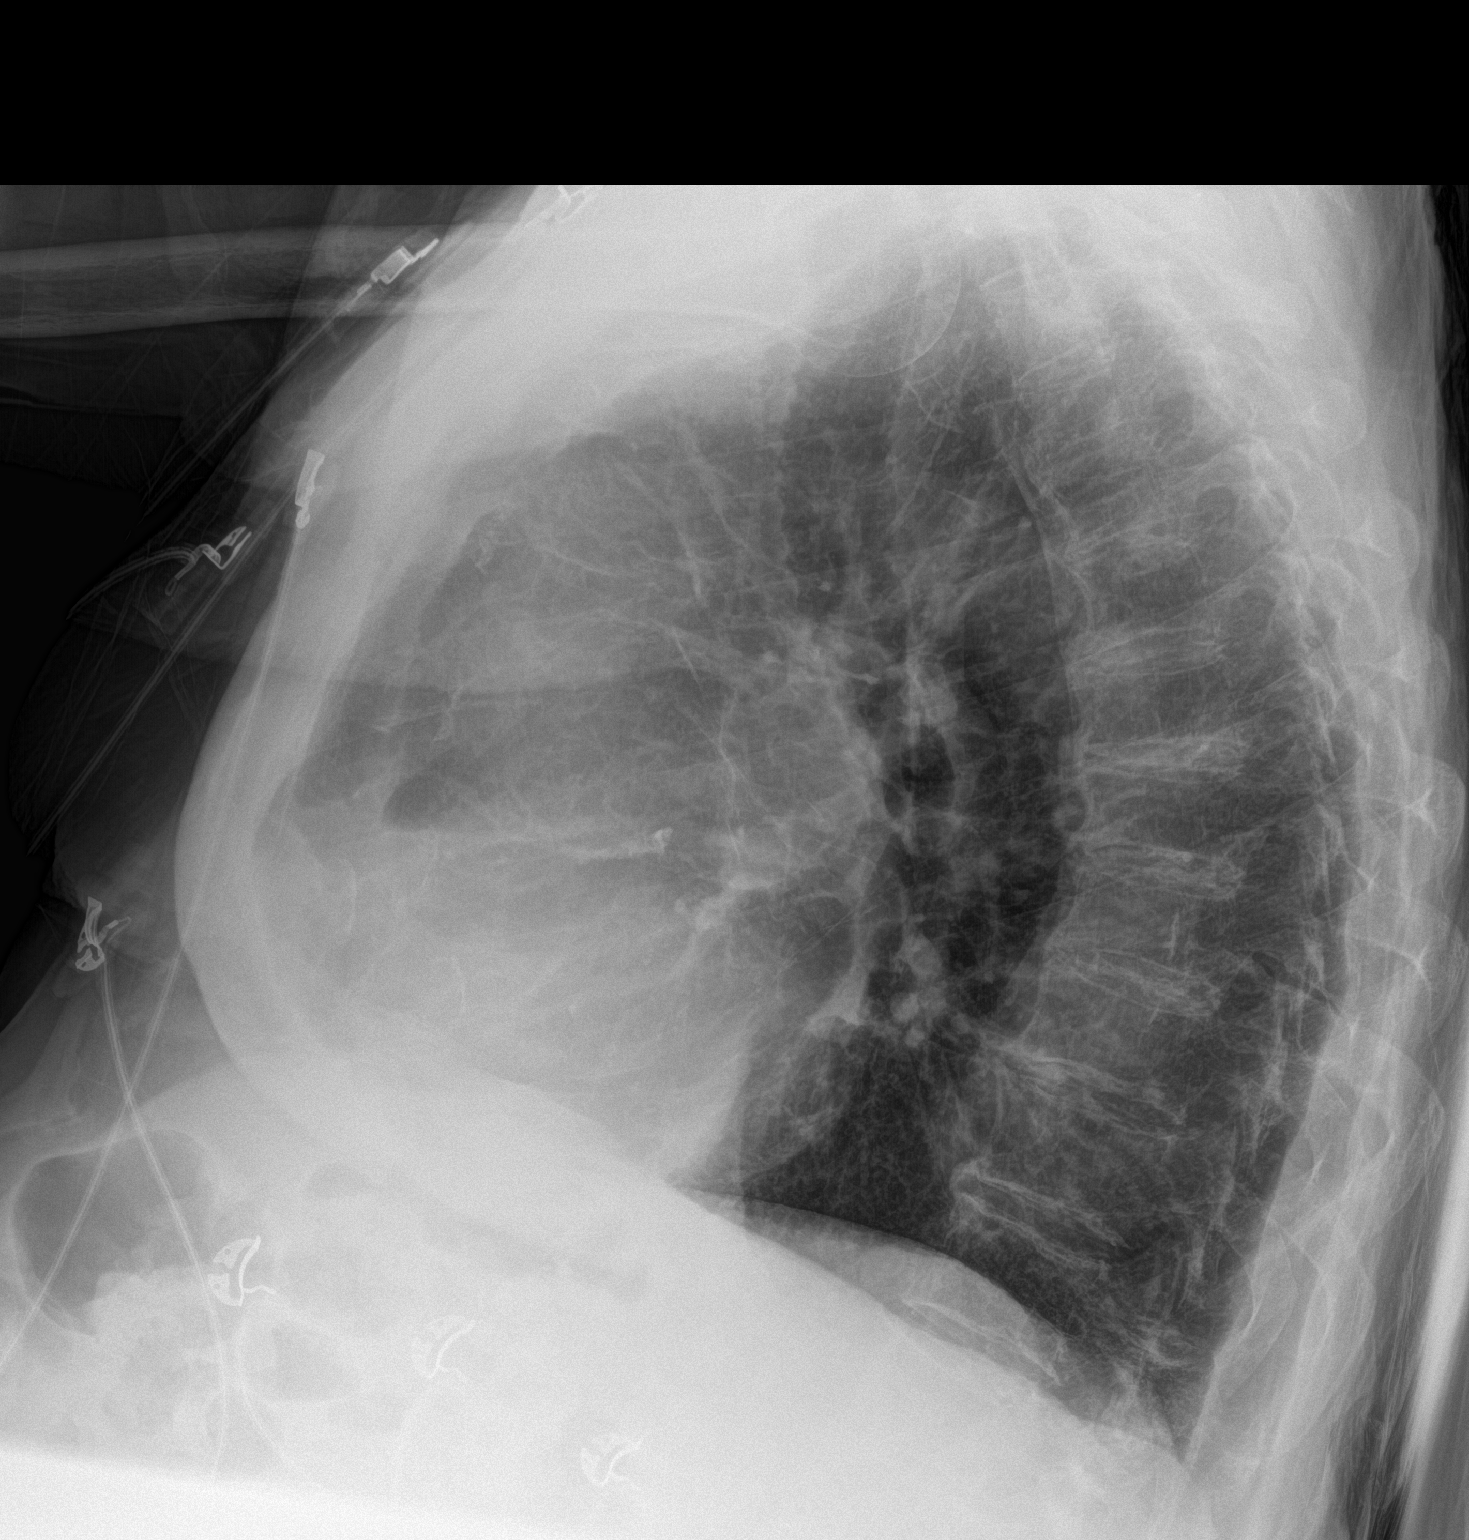

[2 of 2 positions shown; findings below may reference images not displayed]

FINDINGS: The lungs are mildly hyperinflated with hemidiaphragm flattening.
There is no focal infiltrate. The interstitial markings are coarse.
There are chronically increased lung markings in the left perihilar
region. The heart is top-normal in size. The pulmonary vascularity
is not engorged. The mediastinum is normal in width. There is
multilevel degenerative disc disease of the thoracic spine. There is
calcification in the wall of the thoracic aorta.
IMPRESSION: COPD. Chronic scarring or other stable process in the left upper
lobe inferiorly.

Top-normal cardiac size without pulmonary edema.

Aortic atherosclerosis.

## 2017-06-06 IMAGING — DX DG CHEST 1V PORT
1 series · 1 of 1 positions shown · non-contrast
Comparison: Chest x-ray from earlier same day.

CLINICAL DATA: Admitted for lower extremity wound care. Central
line placement.

EXAM:
PORTABLE CHEST 1 VIEW

[chest ap]
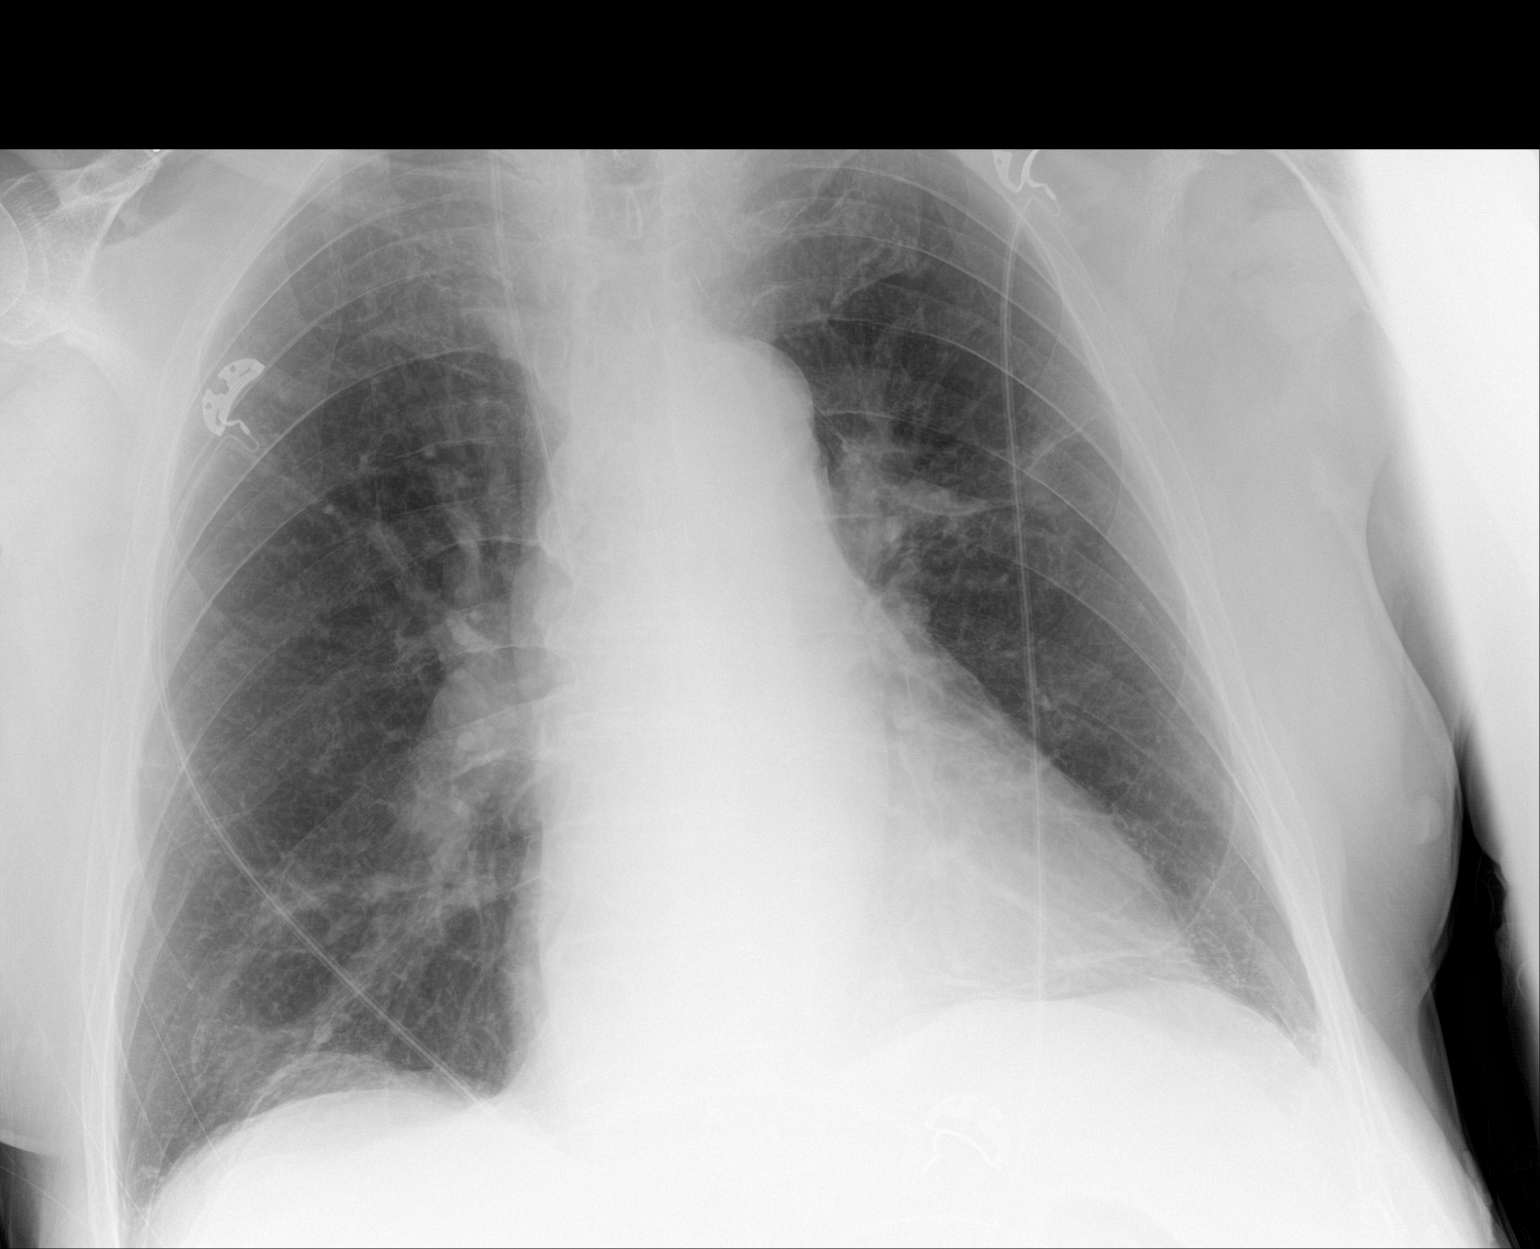

[1 of 1 positions shown; findings below may reference images not displayed]

FINDINGS: Right IJ central line has been placed in the interval. Tip of the
line is adequately positioned at the level of the mid SVC. No other
interval change. No pneumothorax.
IMPRESSION: Right IJ central line placement with tip adequately positioned at
the level of the mid SVC. Could consider advancing approximately 4-5
cm for more optimal radiographic positioning at the cavoatrial
junction.

## 2017-06-06 IMAGING — DX DG FOOT COMPLETE 3+V*R*
3 series · 3 of 3 positions shown · non-contrast
Comparison: Right foot series 03/10/2011. Right tib-fib series from
today reported separately.

CLINICAL DATA: [AGE] male with right lower leg wound and
extremity swelling. Initial encounter.

EXAM:
RIGHT FOOT COMPLETE - 3+ VIEW

[foot ap]
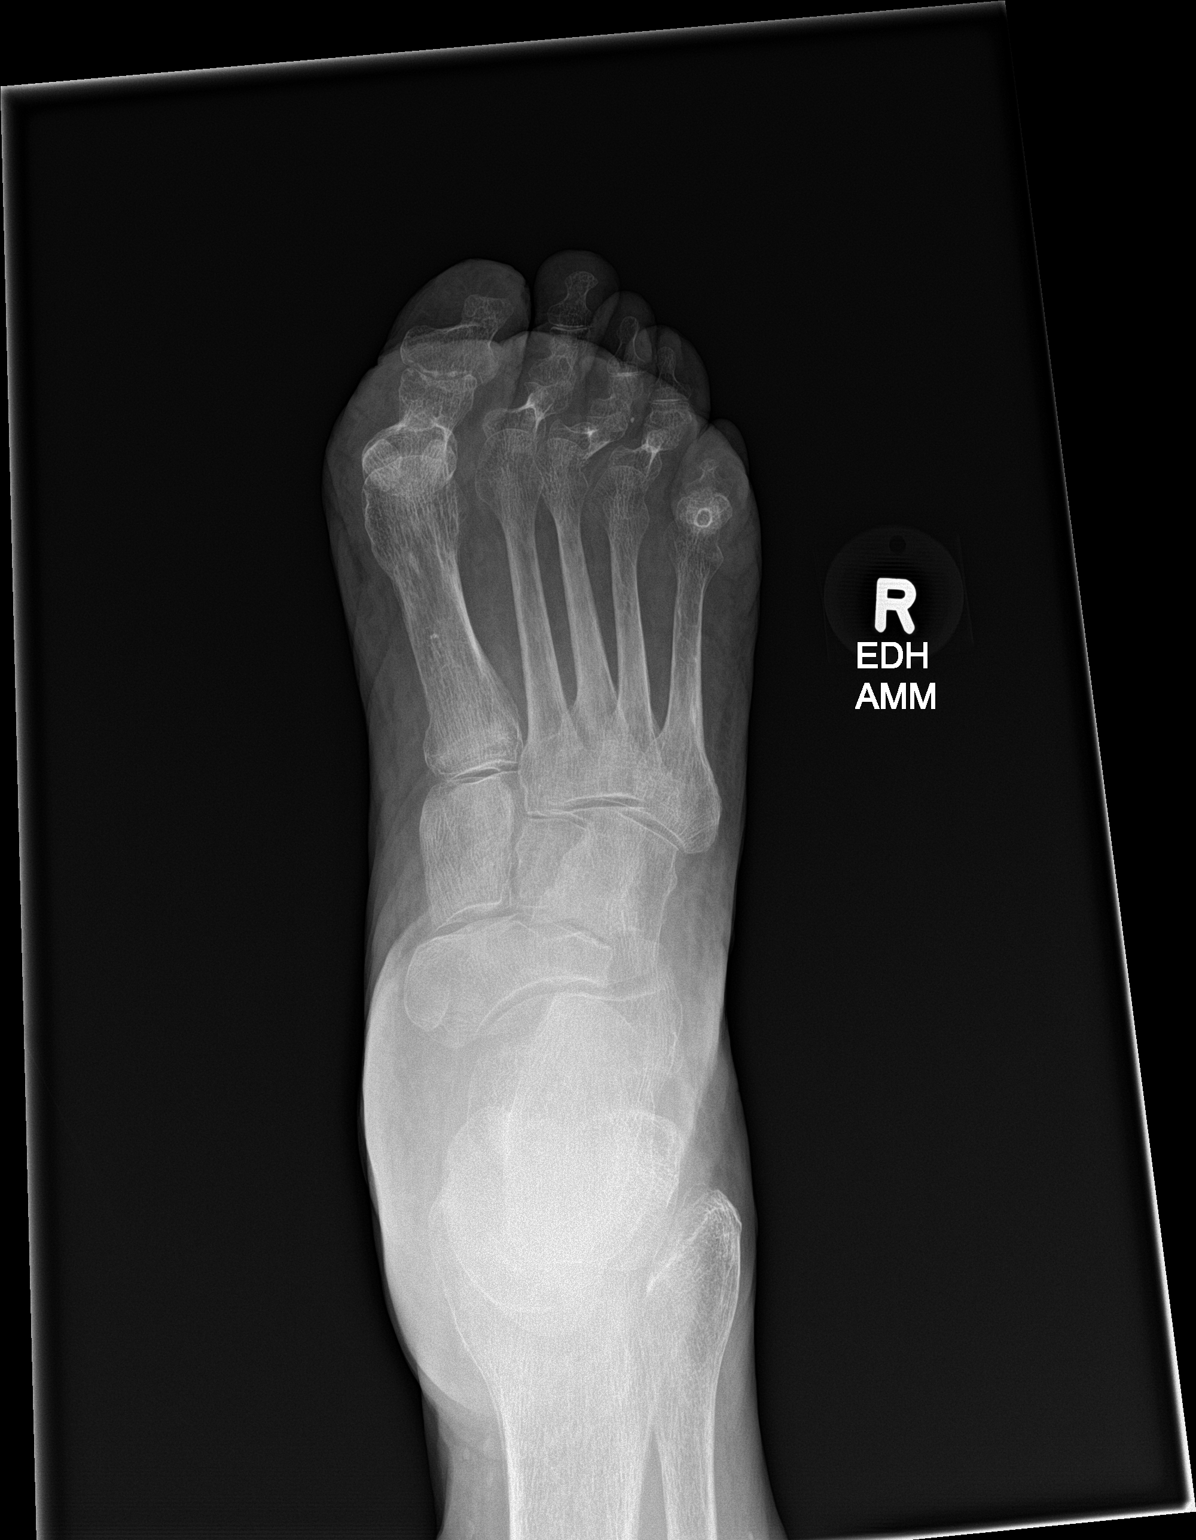

[foot obl]
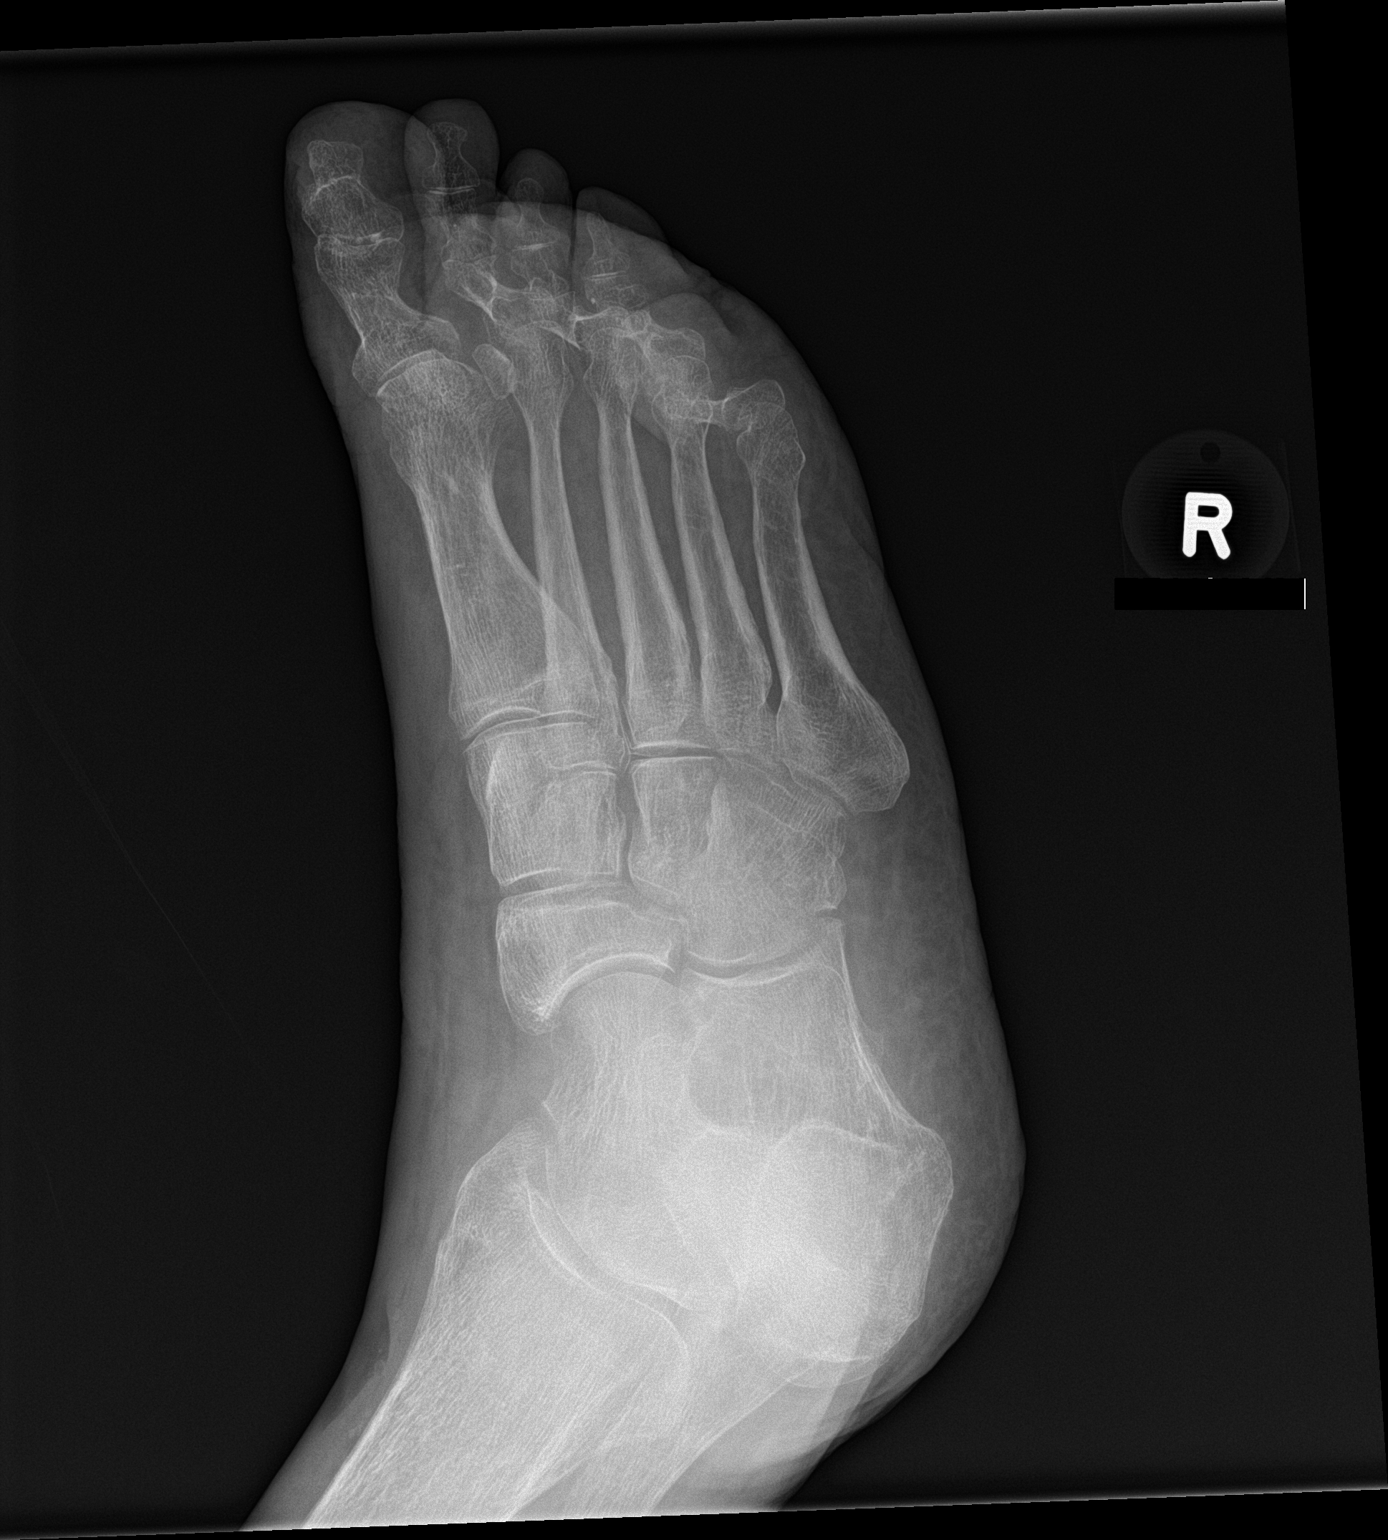

[foot lat]
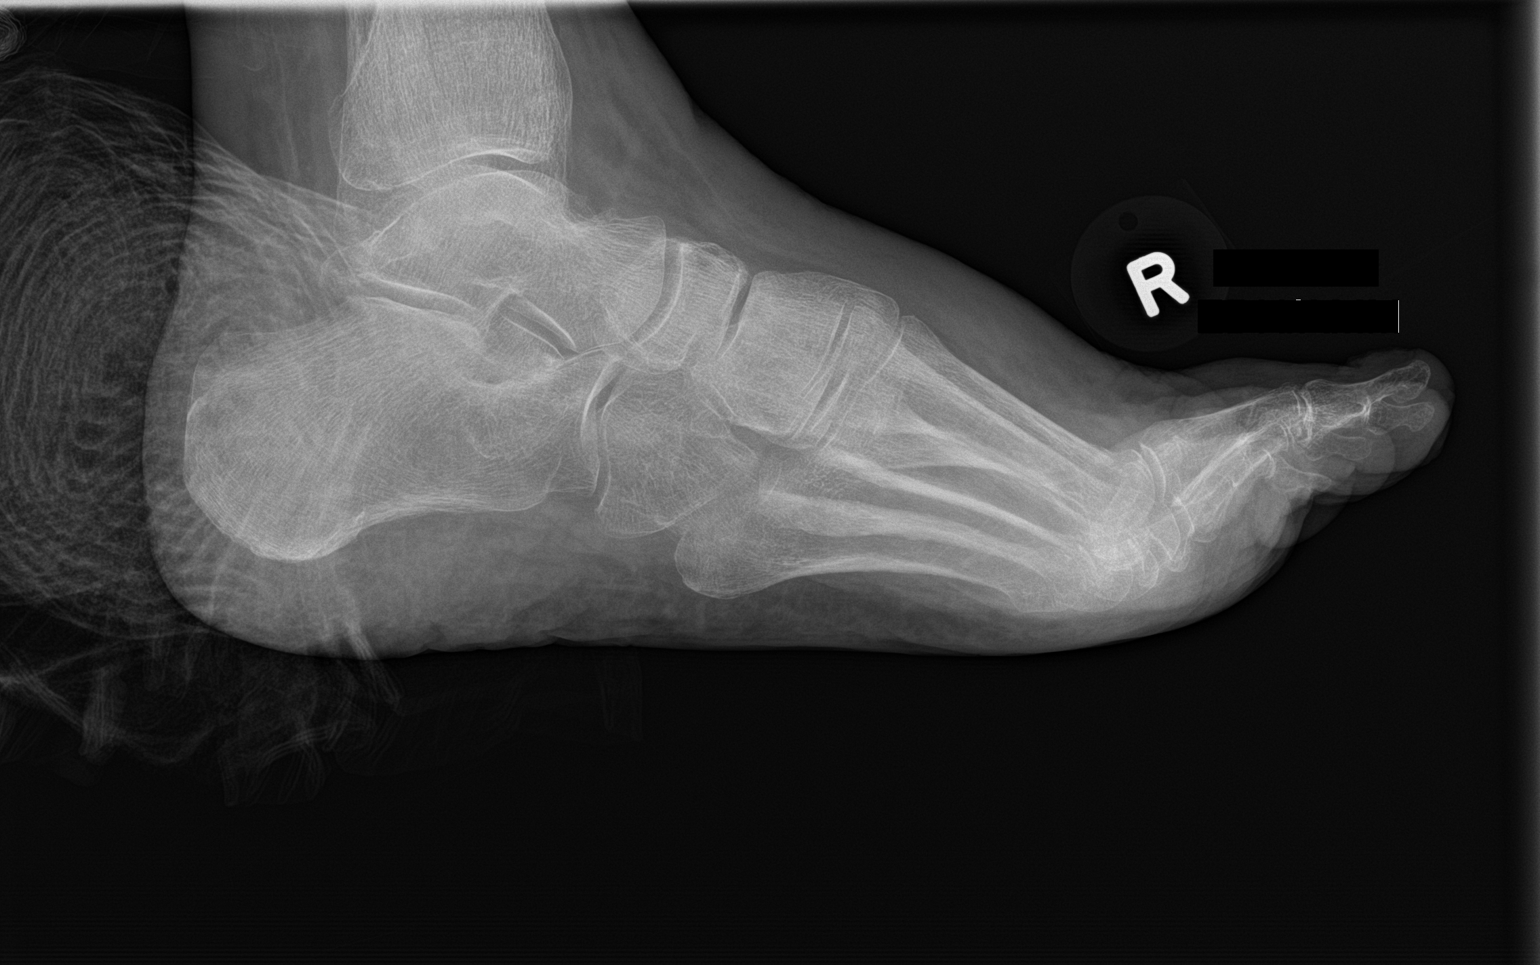

[3 of 3 positions shown; findings below may reference images not displayed]

FINDINGS: Bone mineralization remains normal for age. Right foot osseous
structures appear stable and intact. Suggestion of soft tissue wound
proximal to the medial malleolus again noted (image 2). No
subcutaneous gas in the foot. No acute osseous abnormality
identified.
IMPRESSION: No acute osseous abnormality in the right foot.
